# Patient Record
Sex: Female | Born: 1945
Health system: Southern US, Community
[De-identification: ages and names within clinical notes are randomized; demographics above are authoritative.]

## PROBLEM LIST (undated history)

## (undated) DIAGNOSIS — F329 Major depressive disorder, single episode, unspecified: Secondary | ICD-10-CM

## (undated) DIAGNOSIS — K5792 Diverticulitis of intestine, part unspecified, without perforation or abscess without bleeding: Secondary | ICD-10-CM

## (undated) DIAGNOSIS — I10 Essential (primary) hypertension: Secondary | ICD-10-CM

## (undated) DIAGNOSIS — K589 Irritable bowel syndrome without diarrhea: Secondary | ICD-10-CM

## (undated) DIAGNOSIS — E669 Obesity, unspecified: Secondary | ICD-10-CM

## (undated) DIAGNOSIS — R7303 Prediabetes: Secondary | ICD-10-CM

## (undated) DIAGNOSIS — E039 Hypothyroidism, unspecified: Secondary | ICD-10-CM

## (undated) DIAGNOSIS — M1990 Unspecified osteoarthritis, unspecified site: Secondary | ICD-10-CM

## (undated) DIAGNOSIS — T7840XA Allergy, unspecified, initial encounter: Secondary | ICD-10-CM

## (undated) DIAGNOSIS — E079 Disorder of thyroid, unspecified: Secondary | ICD-10-CM

## (undated) DIAGNOSIS — F32A Depression, unspecified: Secondary | ICD-10-CM

## (undated) DIAGNOSIS — E785 Hyperlipidemia, unspecified: Secondary | ICD-10-CM

## (undated) DIAGNOSIS — C50919 Malignant neoplasm of unspecified site of unspecified female breast: Secondary | ICD-10-CM

## (undated) HISTORY — DX: Unspecified osteoarthritis, unspecified site: M19.90

## (undated) HISTORY — DX: Allergy, unspecified, initial encounter: T78.40XA

## (undated) HISTORY — DX: Obesity, unspecified: E66.9

## (undated) HISTORY — DX: Hyperlipidemia, unspecified: E78.5

## (undated) HISTORY — DX: Depression, unspecified: F32.A

## (undated) HISTORY — DX: Disorder of thyroid, unspecified: E07.9

## (undated) HISTORY — PX: COLONOSCOPY: SHX174

## (undated) HISTORY — PX: MASTECTOMY, RADICAL: SHX710

## (undated) HISTORY — DX: Irritable bowel syndrome, unspecified: K58.9

## (undated) HISTORY — DX: Diverticulitis of intestine, part unspecified, without perforation or abscess without bleeding: K57.92

## (undated) HISTORY — PX: ROTATOR CUFF REPAIR: SHX139

## (undated) HISTORY — DX: Malignant neoplasm of unspecified site of unspecified female breast: C50.919

## (undated) HISTORY — DX: Major depressive disorder, single episode, unspecified: F32.9

---

## 2003-08-02 DIAGNOSIS — C50919 Malignant neoplasm of unspecified site of unspecified female breast: Secondary | ICD-10-CM

## 2003-08-02 HISTORY — DX: Malignant neoplasm of unspecified site of unspecified female breast: C50.919

## 2011-11-30 DIAGNOSIS — L570 Actinic keratosis: Secondary | ICD-10-CM | POA: Diagnosis not present

## 2011-11-30 DIAGNOSIS — Z85828 Personal history of other malignant neoplasm of skin: Secondary | ICD-10-CM | POA: Diagnosis not present

## 2011-11-30 DIAGNOSIS — L578 Other skin changes due to chronic exposure to nonionizing radiation: Secondary | ICD-10-CM | POA: Diagnosis not present

## 2011-11-30 DIAGNOSIS — D239 Other benign neoplasm of skin, unspecified: Secondary | ICD-10-CM | POA: Diagnosis not present

## 2011-11-30 DIAGNOSIS — L219 Seborrheic dermatitis, unspecified: Secondary | ICD-10-CM | POA: Diagnosis not present

## 2011-12-09 DIAGNOSIS — I10 Essential (primary) hypertension: Secondary | ICD-10-CM | POA: Diagnosis not present

## 2011-12-09 DIAGNOSIS — D510 Vitamin B12 deficiency anemia due to intrinsic factor deficiency: Secondary | ICD-10-CM | POA: Diagnosis not present

## 2011-12-09 DIAGNOSIS — Z79899 Other long term (current) drug therapy: Secondary | ICD-10-CM | POA: Diagnosis not present

## 2011-12-09 DIAGNOSIS — E785 Hyperlipidemia, unspecified: Secondary | ICD-10-CM | POA: Diagnosis not present

## 2011-12-09 DIAGNOSIS — M76899 Other specified enthesopathies of unspecified lower limb, excluding foot: Secondary | ICD-10-CM | POA: Diagnosis not present

## 2011-12-09 DIAGNOSIS — R5381 Other malaise: Secondary | ICD-10-CM | POA: Diagnosis not present

## 2011-12-09 DIAGNOSIS — E7800 Pure hypercholesterolemia, unspecified: Secondary | ICD-10-CM | POA: Diagnosis not present

## 2011-12-09 DIAGNOSIS — R5383 Other fatigue: Secondary | ICD-10-CM | POA: Diagnosis not present

## 2011-12-09 DIAGNOSIS — E559 Vitamin D deficiency, unspecified: Secondary | ICD-10-CM | POA: Diagnosis not present

## 2011-12-27 DIAGNOSIS — M545 Low back pain, unspecified: Secondary | ICD-10-CM | POA: Diagnosis not present

## 2011-12-27 DIAGNOSIS — M5430 Sciatica, unspecified side: Secondary | ICD-10-CM | POA: Diagnosis not present

## 2012-02-24 DIAGNOSIS — J019 Acute sinusitis, unspecified: Secondary | ICD-10-CM | POA: Diagnosis not present

## 2012-02-24 DIAGNOSIS — J309 Allergic rhinitis, unspecified: Secondary | ICD-10-CM | POA: Diagnosis not present

## 2012-05-16 DIAGNOSIS — L820 Inflamed seborrheic keratosis: Secondary | ICD-10-CM | POA: Diagnosis not present

## 2012-05-16 DIAGNOSIS — L570 Actinic keratosis: Secondary | ICD-10-CM | POA: Diagnosis not present

## 2012-05-16 DIAGNOSIS — D239 Other benign neoplasm of skin, unspecified: Secondary | ICD-10-CM | POA: Diagnosis not present

## 2012-05-16 DIAGNOSIS — Z85828 Personal history of other malignant neoplasm of skin: Secondary | ICD-10-CM | POA: Diagnosis not present

## 2012-07-11 DIAGNOSIS — C50919 Malignant neoplasm of unspecified site of unspecified female breast: Secondary | ICD-10-CM | POA: Diagnosis not present

## 2012-07-12 DIAGNOSIS — Z853 Personal history of malignant neoplasm of breast: Secondary | ICD-10-CM | POA: Diagnosis not present

## 2012-10-11 DIAGNOSIS — G5600 Carpal tunnel syndrome, unspecified upper limb: Secondary | ICD-10-CM | POA: Diagnosis not present

## 2012-10-11 DIAGNOSIS — M25519 Pain in unspecified shoulder: Secondary | ICD-10-CM | POA: Diagnosis not present

## 2012-10-12 DIAGNOSIS — E7800 Pure hypercholesterolemia, unspecified: Secondary | ICD-10-CM | POA: Diagnosis not present

## 2012-10-12 DIAGNOSIS — D510 Vitamin B12 deficiency anemia due to intrinsic factor deficiency: Secondary | ICD-10-CM | POA: Diagnosis not present

## 2012-10-12 DIAGNOSIS — I10 Essential (primary) hypertension: Secondary | ICD-10-CM | POA: Diagnosis not present

## 2012-10-12 DIAGNOSIS — Z79899 Other long term (current) drug therapy: Secondary | ICD-10-CM | POA: Diagnosis not present

## 2012-10-12 DIAGNOSIS — E559 Vitamin D deficiency, unspecified: Secondary | ICD-10-CM | POA: Diagnosis not present

## 2012-10-31 DIAGNOSIS — L570 Actinic keratosis: Secondary | ICD-10-CM | POA: Diagnosis not present

## 2012-10-31 DIAGNOSIS — L819 Disorder of pigmentation, unspecified: Secondary | ICD-10-CM | POA: Diagnosis not present

## 2012-10-31 DIAGNOSIS — Z85828 Personal history of other malignant neoplasm of skin: Secondary | ICD-10-CM | POA: Diagnosis not present

## 2012-10-31 DIAGNOSIS — D239 Other benign neoplasm of skin, unspecified: Secondary | ICD-10-CM | POA: Diagnosis not present

## 2012-11-23 DIAGNOSIS — I10 Essential (primary) hypertension: Secondary | ICD-10-CM | POA: Diagnosis not present

## 2012-11-23 DIAGNOSIS — E039 Hypothyroidism, unspecified: Secondary | ICD-10-CM | POA: Diagnosis not present

## 2012-11-27 DIAGNOSIS — R7989 Other specified abnormal findings of blood chemistry: Secondary | ICD-10-CM | POA: Diagnosis not present

## 2013-02-14 DIAGNOSIS — R7989 Other specified abnormal findings of blood chemistry: Secondary | ICD-10-CM | POA: Diagnosis not present

## 2013-02-14 DIAGNOSIS — E781 Pure hyperglyceridemia: Secondary | ICD-10-CM | POA: Diagnosis not present

## 2013-02-14 DIAGNOSIS — E559 Vitamin D deficiency, unspecified: Secondary | ICD-10-CM | POA: Diagnosis not present

## 2013-02-14 DIAGNOSIS — I10 Essential (primary) hypertension: Secondary | ICD-10-CM | POA: Diagnosis not present

## 2013-02-14 DIAGNOSIS — E039 Hypothyroidism, unspecified: Secondary | ICD-10-CM | POA: Diagnosis not present

## 2013-02-14 DIAGNOSIS — E7800 Pure hypercholesterolemia, unspecified: Secondary | ICD-10-CM | POA: Diagnosis not present

## 2013-04-24 DIAGNOSIS — Z85828 Personal history of other malignant neoplasm of skin: Secondary | ICD-10-CM | POA: Diagnosis not present

## 2013-04-24 DIAGNOSIS — L820 Inflamed seborrheic keratosis: Secondary | ICD-10-CM | POA: Diagnosis not present

## 2013-04-24 DIAGNOSIS — D239 Other benign neoplasm of skin, unspecified: Secondary | ICD-10-CM | POA: Diagnosis not present

## 2013-05-16 DIAGNOSIS — K5732 Diverticulitis of large intestine without perforation or abscess without bleeding: Secondary | ICD-10-CM | POA: Diagnosis not present

## 2013-07-16 DIAGNOSIS — G5600 Carpal tunnel syndrome, unspecified upper limb: Secondary | ICD-10-CM | POA: Diagnosis not present

## 2013-07-16 DIAGNOSIS — M25549 Pain in joints of unspecified hand: Secondary | ICD-10-CM | POA: Diagnosis not present

## 2013-09-09 DIAGNOSIS — C50919 Malignant neoplasm of unspecified site of unspecified female breast: Secondary | ICD-10-CM | POA: Diagnosis not present

## 2013-09-25 DIAGNOSIS — Z853 Personal history of malignant neoplasm of breast: Secondary | ICD-10-CM | POA: Diagnosis not present

## 2013-09-25 DIAGNOSIS — Z09 Encounter for follow-up examination after completed treatment for conditions other than malignant neoplasm: Secondary | ICD-10-CM | POA: Diagnosis not present

## 2013-09-25 DIAGNOSIS — Z9010 Acquired absence of unspecified breast and nipple: Secondary | ICD-10-CM | POA: Diagnosis not present

## 2013-10-24 DIAGNOSIS — R6889 Other general symptoms and signs: Secondary | ICD-10-CM | POA: Diagnosis not present

## 2013-10-24 DIAGNOSIS — J111 Influenza due to unidentified influenza virus with other respiratory manifestations: Secondary | ICD-10-CM | POA: Diagnosis not present

## 2013-10-24 DIAGNOSIS — J069 Acute upper respiratory infection, unspecified: Secondary | ICD-10-CM | POA: Diagnosis not present

## 2013-10-25 DIAGNOSIS — J111 Influenza due to unidentified influenza virus with other respiratory manifestations: Secondary | ICD-10-CM | POA: Diagnosis not present

## 2013-11-06 DIAGNOSIS — J159 Unspecified bacterial pneumonia: Secondary | ICD-10-CM | POA: Diagnosis not present

## 2013-11-13 DIAGNOSIS — E039 Hypothyroidism, unspecified: Secondary | ICD-10-CM | POA: Diagnosis not present

## 2013-11-13 DIAGNOSIS — B370 Candidal stomatitis: Secondary | ICD-10-CM | POA: Diagnosis not present

## 2013-11-13 DIAGNOSIS — D510 Vitamin B12 deficiency anemia due to intrinsic factor deficiency: Secondary | ICD-10-CM | POA: Diagnosis not present

## 2013-11-13 DIAGNOSIS — I10 Essential (primary) hypertension: Secondary | ICD-10-CM | POA: Diagnosis not present

## 2013-11-13 DIAGNOSIS — E7800 Pure hypercholesterolemia, unspecified: Secondary | ICD-10-CM | POA: Diagnosis not present

## 2013-11-13 DIAGNOSIS — Z6831 Body mass index (BMI) 31.0-31.9, adult: Secondary | ICD-10-CM | POA: Diagnosis not present

## 2013-11-13 DIAGNOSIS — M810 Age-related osteoporosis without current pathological fracture: Secondary | ICD-10-CM | POA: Diagnosis not present

## 2013-11-13 DIAGNOSIS — J209 Acute bronchitis, unspecified: Secondary | ICD-10-CM | POA: Diagnosis not present

## 2013-11-13 DIAGNOSIS — M62838 Other muscle spasm: Secondary | ICD-10-CM | POA: Diagnosis not present

## 2013-11-13 DIAGNOSIS — M5380 Other specified dorsopathies, site unspecified: Secondary | ICD-10-CM | POA: Diagnosis not present

## 2013-12-11 DIAGNOSIS — D239 Other benign neoplasm of skin, unspecified: Secondary | ICD-10-CM | POA: Diagnosis not present

## 2013-12-11 DIAGNOSIS — Z85828 Personal history of other malignant neoplasm of skin: Secondary | ICD-10-CM | POA: Diagnosis not present

## 2013-12-11 DIAGNOSIS — L570 Actinic keratosis: Secondary | ICD-10-CM | POA: Diagnosis not present

## 2014-04-21 ENCOUNTER — Other Ambulatory Visit (HOSPITAL_COMMUNITY): Payer: Self-pay | Admitting: Family Medicine

## 2014-04-21 DIAGNOSIS — E039 Hypothyroidism, unspecified: Secondary | ICD-10-CM | POA: Diagnosis not present

## 2014-04-21 DIAGNOSIS — I10 Essential (primary) hypertension: Secondary | ICD-10-CM | POA: Diagnosis not present

## 2014-04-21 DIAGNOSIS — M7989 Other specified soft tissue disorders: Secondary | ICD-10-CM

## 2014-04-21 DIAGNOSIS — E785 Hyperlipidemia, unspecified: Secondary | ICD-10-CM | POA: Diagnosis not present

## 2014-04-21 DIAGNOSIS — R609 Edema, unspecified: Secondary | ICD-10-CM | POA: Diagnosis not present

## 2014-04-22 ENCOUNTER — Ambulatory Visit (HOSPITAL_COMMUNITY)
Admission: RE | Admit: 2014-04-22 | Discharge: 2014-04-22 | Disposition: A | Discharge status: Home / Self Care | Payer: Medicare Other | Source: Ambulatory Visit | Attending: Family Medicine | Admitting: Family Medicine

## 2014-04-22 DIAGNOSIS — M7989 Other specified soft tissue disorders: Secondary | ICD-10-CM | POA: Diagnosis not present

## 2014-04-22 DIAGNOSIS — R599 Enlarged lymph nodes, unspecified: Secondary | ICD-10-CM | POA: Insufficient documentation

## 2014-04-22 NOTE — Progress Notes (Signed)
Right lower extremity venous duplex completed.  Right:  No evidence of DVT, superficial thrombosis, or Baker's cyst.  Left:  Negative for DVT in the common femoral vein.  

## 2014-04-30 DIAGNOSIS — E039 Hypothyroidism, unspecified: Secondary | ICD-10-CM | POA: Diagnosis not present

## 2014-04-30 DIAGNOSIS — Z853 Personal history of malignant neoplasm of breast: Secondary | ICD-10-CM | POA: Diagnosis not present

## 2014-04-30 DIAGNOSIS — F3289 Other specified depressive episodes: Secondary | ICD-10-CM | POA: Diagnosis not present

## 2014-04-30 DIAGNOSIS — M25579 Pain in unspecified ankle and joints of unspecified foot: Secondary | ICD-10-CM | POA: Diagnosis not present

## 2014-04-30 DIAGNOSIS — E669 Obesity, unspecified: Secondary | ICD-10-CM | POA: Diagnosis not present

## 2014-04-30 DIAGNOSIS — R7309 Other abnormal glucose: Secondary | ICD-10-CM | POA: Diagnosis not present

## 2014-04-30 DIAGNOSIS — Z0000 Encounter for general adult medical examination without abnormal findings: Secondary | ICD-10-CM | POA: Diagnosis not present

## 2014-04-30 DIAGNOSIS — F329 Major depressive disorder, single episode, unspecified: Secondary | ICD-10-CM | POA: Diagnosis not present

## 2014-04-30 DIAGNOSIS — K5732 Diverticulitis of large intestine without perforation or abscess without bleeding: Secondary | ICD-10-CM | POA: Diagnosis not present

## 2014-05-01 ENCOUNTER — Ambulatory Visit: Payer: Self-pay | Admitting: Family

## 2014-05-28 DIAGNOSIS — R609 Edema, unspecified: Secondary | ICD-10-CM | POA: Diagnosis not present

## 2014-05-28 DIAGNOSIS — R739 Hyperglycemia, unspecified: Secondary | ICD-10-CM | POA: Diagnosis not present

## 2014-06-11 DIAGNOSIS — L814 Other melanin hyperpigmentation: Secondary | ICD-10-CM | POA: Diagnosis not present

## 2014-06-11 DIAGNOSIS — D225 Melanocytic nevi of trunk: Secondary | ICD-10-CM | POA: Diagnosis not present

## 2014-06-11 DIAGNOSIS — Z86008 Personal history of in-situ neoplasm of other site: Secondary | ICD-10-CM | POA: Diagnosis not present

## 2014-06-11 DIAGNOSIS — Z09 Encounter for follow-up examination after completed treatment for conditions other than malignant neoplasm: Secondary | ICD-10-CM | POA: Diagnosis not present

## 2014-06-11 DIAGNOSIS — L820 Inflamed seborrheic keratosis: Secondary | ICD-10-CM | POA: Diagnosis not present

## 2014-08-19 DIAGNOSIS — K5792 Diverticulitis of intestine, part unspecified, without perforation or abscess without bleeding: Secondary | ICD-10-CM | POA: Diagnosis not present

## 2014-08-19 DIAGNOSIS — C50919 Malignant neoplasm of unspecified site of unspecified female breast: Secondary | ICD-10-CM | POA: Diagnosis not present

## 2014-08-19 DIAGNOSIS — F339 Major depressive disorder, recurrent, unspecified: Secondary | ICD-10-CM | POA: Diagnosis not present

## 2014-08-19 DIAGNOSIS — Z23 Encounter for immunization: Secondary | ICD-10-CM | POA: Diagnosis not present

## 2014-09-19 DIAGNOSIS — K589 Irritable bowel syndrome without diarrhea: Secondary | ICD-10-CM | POA: Diagnosis not present

## 2014-09-19 DIAGNOSIS — I10 Essential (primary) hypertension: Secondary | ICD-10-CM | POA: Diagnosis not present

## 2014-09-19 DIAGNOSIS — E039 Hypothyroidism, unspecified: Secondary | ICD-10-CM | POA: Diagnosis not present

## 2014-09-19 DIAGNOSIS — E785 Hyperlipidemia, unspecified: Secondary | ICD-10-CM | POA: Diagnosis not present

## 2014-11-07 ENCOUNTER — Emergency Department (HOSPITAL_COMMUNITY): Payer: Medicare Other

## 2014-11-07 ENCOUNTER — Emergency Department (HOSPITAL_COMMUNITY)
Admission: EM | Admit: 2014-11-07 | Discharge: 2014-11-07 | Disposition: A | Discharge status: Home / Self Care | Payer: Medicare Other | Attending: Emergency Medicine | Admitting: Emergency Medicine

## 2014-11-07 ENCOUNTER — Encounter (HOSPITAL_COMMUNITY): Payer: Self-pay | Admitting: Emergency Medicine

## 2014-11-07 DIAGNOSIS — S3992XA Unspecified injury of lower back, initial encounter: Secondary | ICD-10-CM | POA: Diagnosis not present

## 2014-11-07 DIAGNOSIS — W1789XA Other fall from one level to another, initial encounter: Secondary | ICD-10-CM | POA: Diagnosis not present

## 2014-11-07 DIAGNOSIS — S52611A Displaced fracture of right ulna styloid process, initial encounter for closed fracture: Secondary | ICD-10-CM | POA: Diagnosis not present

## 2014-11-07 DIAGNOSIS — Z7982 Long term (current) use of aspirin: Secondary | ICD-10-CM | POA: Insufficient documentation

## 2014-11-07 DIAGNOSIS — I10 Essential (primary) hypertension: Secondary | ICD-10-CM | POA: Insufficient documentation

## 2014-11-07 DIAGNOSIS — Y9389 Activity, other specified: Secondary | ICD-10-CM | POA: Insufficient documentation

## 2014-11-07 DIAGNOSIS — Z79899 Other long term (current) drug therapy: Secondary | ICD-10-CM | POA: Diagnosis not present

## 2014-11-07 DIAGNOSIS — W19XXXA Unspecified fall, initial encounter: Secondary | ICD-10-CM

## 2014-11-07 DIAGNOSIS — Y998 Other external cause status: Secondary | ICD-10-CM | POA: Diagnosis not present

## 2014-11-07 DIAGNOSIS — Y9289 Other specified places as the place of occurrence of the external cause: Secondary | ICD-10-CM | POA: Insufficient documentation

## 2014-11-07 DIAGNOSIS — S6991XA Unspecified injury of right wrist, hand and finger(s), initial encounter: Secondary | ICD-10-CM | POA: Diagnosis present

## 2014-11-07 DIAGNOSIS — M25431 Effusion, right wrist: Secondary | ICD-10-CM | POA: Insufficient documentation

## 2014-11-07 DIAGNOSIS — M545 Low back pain: Secondary | ICD-10-CM | POA: Diagnosis not present

## 2014-11-07 DIAGNOSIS — S62101A Fracture of unspecified carpal bone, right wrist, initial encounter for closed fracture: Secondary | ICD-10-CM

## 2014-11-07 HISTORY — DX: Essential (primary) hypertension: I10

## 2014-11-07 MED ORDER — METHOCARBAMOL 500 MG PO TABS
1000.0000 mg | ORAL_TABLET | Freq: Once | ORAL | Status: AC
  Administered 2014-11-07: 1000 mg via ORAL
  Filled 2014-11-07: qty 2

## 2014-11-07 MED ORDER — TRAMADOL HCL 50 MG PO TABS: 50.0000 mg | ORAL_TABLET | Freq: Four times a day (QID) | ORAL | Status: DC | PRN

## 2014-11-07 MED ORDER — TRAMADOL HCL 50 MG PO TABS
50.0000 mg | ORAL_TABLET | Freq: Once | ORAL | Status: AC
  Administered 2014-11-07: 50 mg via ORAL
  Filled 2014-11-07: qty 1

## 2014-11-07 MED ORDER — METHOCARBAMOL 500 MG PO TABS: 500.0000 mg | ORAL_TABLET | Freq: Three times a day (TID) | ORAL | Status: DC | PRN

## 2014-11-07 NOTE — Progress Notes (Signed)
Orthopedic Tech Progress Note Patient Details:  Jamie Benjamin 1946/01/10 [id number]  Ortho Devices Type of Ortho Device: Ace wrap, Arm sling, Sugartong splint Ortho Device/Splint Location: rue Ortho Device/Splint Interventions: Application   [name] 11/07/2014, 2:25 PM

## 2014-11-07 NOTE — ED Provider Notes (Addendum)
CSN: [id number]     Arrival date & time 11/07/14  1151 History   First MD Initiated Contact with Patient 11/07/14 1155     Chief Complaint  Patient presents with  . Fall  . Wrist Injury  . Back Pain     HPI  Procedure dilation after a fall. She was standing on her couch adjusting her curtains. She fell backwards. His pain right wrist and her low back. 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  Check her head or lose consciousness per no others of pain or concern.  Past Medical History  Diagnosis Date  . Hypertension    Past Surgical History  Procedure Laterality Date  . Mastectomy, radical Bilateral 10 years ago  . Rotator cuff repair     No family history on file. History  Substance Use Topics  . Smoking status: Never Smoker   . Smokeless tobacco: Not on file  . Alcohol Use: No   OB History    No data available     Review of Systems  Constitutional: Negative for fever, chills, diaphoresis, appetite change and fatigue.  HENT: Negative for mouth sores, sore throat and trouble swallowing.   Eyes: Negative for visual disturbance.  Respiratory: Negative for cough, chest tightness, shortness of breath and wheezing.   Cardiovascular: Negative for chest pain.  Gastrointestinal: Negative for nausea, vomiting, abdominal pain, diarrhea and abdominal distention.  Endocrine: Negative for polydipsia, polyphagia and polyuria.  Genitourinary: Negative for dysuria, frequency and hematuria.  Musculoskeletal: Negative for gait problem.       Right wrist pain, low back pain.  Skin: Negative for color change, pallor and rash.  Neurological: Negative for dizziness, syncope, light-headedness and headaches.  Hematological: Does not bruise/bleed easily.  Psychiatric/Behavioral: Negative for behavioral problems and confusion.      Allergies  Review of patient's allergies indicates no known allergies.  Home  Medications   Prior to Admission medications   Medication Sig Start Date End Date Taking? Authorizing Provider  amLODipine (NORVASC) 10 MG tablet Take 10 mg by mouth daily. 08/31/14  Yes Historical Provider, MD  aspirin 325 MG tablet Take 325 mg by mouth daily.   Yes Historical Provider, MD  buPROPion (WELLBUTRIN XL) 150 MG 24 hr tablet Take 150 mg by mouth daily. 10/10/14  Yes Historical Provider, MD  cholecalciferol (VITAMIN D) 1000 UNITS tablet Take 2,000 Units by mouth daily.   Yes Historical Provider, MD  furosemide (LASIX) 20 MG tablet Take 20 mg by mouth daily. 08/12/14  Yes Historical Provider, MD  levothyroxine (SYNTHROID, LEVOTHROID) 25 MCG tablet Take 25 mcg by mouth daily before breakfast.   Yes Historical Provider, MD  metoprolol (LOPRESSOR) 50 MG tablet Take 50 mg by mouth 2 (two) times daily. 08/30/14  Yes Historical Provider, MD  pravastatin (PRAVACHOL) 40 MG tablet Take 40 mg by mouth at bedtime. 08/25/14  Yes Historical Provider, MD  methocarbamol (ROBAXIN) 500 MG tablet Take 1 tablet (500 mg total) by mouth 3 (three) times daily between meals as needed. 11/07/14   Tanna Furry, MD  traMADol (ULTRAM) 50 MG tablet Take 1 tablet (50 mg total) by mouth every 6 (six) hours as needed. 11/07/14   Tanna Furry, MD   BP 145/75 mmHg  Pulse 76  Temp(Src) 97.5 F (36.4 C) (Oral)  Resp 20  SpO2 98% Physical Exam  Constitutional: She is oriented to person, place, and time. She appears well-developed  and well-nourished. No distress.  HENT:  Head: Normocephalic.  Eyes: Conjunctivae are normal. Pupils are equal, round, and reactive to light. No scleral icterus.  Neck: Normal range of motion. Neck supple. No thyromegaly present.  Cardiovascular: Normal rate and regular rhythm.  Exam reveals no gallop and no friction rub.   No murmur heard. Pulmonary/Chest: Effort normal and breath sounds normal. No respiratory distress. She has no wheezes. She has no rales.  Abdominal: Soft. Bowel sounds are  normal. She exhibits no distension. There is no tenderness. There is no rebound.  Musculoskeletal: Normal range of motion.  Tenderness and soft tissue swelling over the dorsum of the right wrist. Full range of motion and good sensation distally. Tenderness in the midline and para midline lumbar spine.  Neurological: She is alert and oriented to person, place, and time.  Skin: Skin is warm and dry. No rash noted.  Psychiatric: She has a normal mood and affect. Her behavior is normal.   intact sensation and muscle strength to the lower extremity. Ambulatory.  ED Course  Procedures (including critical care time) Labs Review Labs Reviewed - No data to display  Imaging Review No results found.   EKG Interpretation None      X ray with distal ulna fracture, non displaced, non-intra-articular radial fracture, and ?Triquetral fracture. Upper lumbar lower thoracic show what would appear to be old compression fractures.  MDM   Final diagnoses:  Fall  Wrist fracture, right, closed, initial encounter    Place a splint. Patient will follow-up with hand surgery. After discussion regarding pain control we decided on Ultram and Robaxin.  Tanna Furry, MD 11/07/14 [place], MD 11/07/14 [place], MD 11/07/14 [phone]

## 2014-11-07 NOTE — Discharge Instructions (Signed)
Wrist Fracture °A wrist fracture is a break or crack in one of the bones of your wrist. Your wrist is made up of eight small bones at the palm of your hand (carpal bones) and two long bones that make up your forearm (radius and ulna). The goal of treatment is to hold the injured bone in place while it heals. Surgery may or may not be needed to care for your injured wrist.  °HOME CARE °· Keep your injured wrist raised (elevated). Move your fingers as much as you can. °· Do not put pressure on any part of your cast or splint. It may break. °· Use a plastic bag to protect your cast or splint from water while bathing or showering. Do not lower your cast or splint into water. °· Take medicines only as told by your doctor. °· Keep your cast or splint clean and dry. If it gets wet, damaged, or suddenly feels too tight, tell your doctor right away. °· Do not use any tobacco products including cigarettes, chewing tobacco, or electronic cigarettes. Tobacco can slow bone healing. If you need help quitting, ask your doctor. °· Keep all follow-up visits as told by your doctor. This is important. °· Ask your doctor if you should take supplements of calcium and vitamins C and D. °GET HELP IF:  °· Your cast or splint is damaged, breaks, or gets wet. °· You have a fever. °· You have chills. °· You have very bad pain that does not go away. °· You have more swelling (inflammation) than before the cast was put on. °GET HELP RIGHT AWAY IF:  °· Your hand or fingernails on the injured arm turn blue or gray, or feel cold or numb. °· You lose some feeling in the fingers of your injured arm. °MAKE SURE YOU:  °· Understand these instructions. °· Will watch your condition. °· Will get help right away if you are not doing well or get worse. °Document Released: 01/04/2008 Document Revised: 12/02/2013 Document Reviewed: 01/30/2012 °ExitCare® Patient Information ©2015 [employer]. This information is not intended to replace advice given to you  by your health care provider. Make sure you discuss any questions you have with your health care provider. ° °

## 2014-11-07 NOTE — ED Notes (Signed)
Called ortho tech to place short arm splint

## 2014-11-07 NOTE — ED Notes (Signed)
Pt in X ray

## 2014-11-07 NOTE — ED Notes (Signed)
To ED via private vehicle-- was standing on arm of couch with a cushion on top of arm -- was reaching to fix blinds and fell straight backwards. Hit back of head, right wrist swollen, low back pain. Denies loss of consciousness, alert and oriented x 4, moves all extremities.

## 2014-11-13 DIAGNOSIS — S5291XA Unspecified fracture of right forearm, initial encounter for closed fracture: Secondary | ICD-10-CM | POA: Diagnosis not present

## 2014-11-13 DIAGNOSIS — M4310 Spondylolisthesis, site unspecified: Secondary | ICD-10-CM | POA: Diagnosis not present

## 2014-11-13 DIAGNOSIS — S62114A Nondisplaced fracture of triquetrum [cuneiform] bone, right wrist, initial encounter for closed fracture: Secondary | ICD-10-CM | POA: Diagnosis not present

## 2014-11-27 DIAGNOSIS — S52201D Unspecified fracture of shaft of right ulna, subsequent encounter for closed fracture with routine healing: Secondary | ICD-10-CM | POA: Diagnosis not present

## 2014-11-27 DIAGNOSIS — S62114D Nondisplaced fracture of triquetrum [cuneiform] bone, right wrist, subsequent encounter for fracture with routine healing: Secondary | ICD-10-CM | POA: Diagnosis not present

## 2014-11-27 DIAGNOSIS — S5291XD Unspecified fracture of right forearm, subsequent encounter for closed fracture with routine healing: Secondary | ICD-10-CM | POA: Diagnosis not present

## 2014-12-08 DIAGNOSIS — S52201D Unspecified fracture of shaft of right ulna, subsequent encounter for closed fracture with routine healing: Secondary | ICD-10-CM | POA: Diagnosis not present

## 2014-12-08 DIAGNOSIS — S62114D Nondisplaced fracture of triquetrum [cuneiform] bone, right wrist, subsequent encounter for fracture with routine healing: Secondary | ICD-10-CM | POA: Diagnosis not present

## 2014-12-08 DIAGNOSIS — S5291XD Unspecified fracture of right forearm, subsequent encounter for closed fracture with routine healing: Secondary | ICD-10-CM | POA: Diagnosis not present

## 2014-12-22 DIAGNOSIS — S52201D Unspecified fracture of shaft of right ulna, subsequent encounter for closed fracture with routine healing: Secondary | ICD-10-CM | POA: Diagnosis not present

## 2014-12-22 DIAGNOSIS — S62114D Nondisplaced fracture of triquetrum [cuneiform] bone, right wrist, subsequent encounter for fracture with routine healing: Secondary | ICD-10-CM | POA: Diagnosis not present

## 2015-01-05 DIAGNOSIS — Z86008 Personal history of in-situ neoplasm of other site: Secondary | ICD-10-CM | POA: Diagnosis not present

## 2015-01-05 DIAGNOSIS — Z1283 Encounter for screening for malignant neoplasm of skin: Secondary | ICD-10-CM | POA: Diagnosis not present

## 2015-01-05 DIAGNOSIS — L570 Actinic keratosis: Secondary | ICD-10-CM | POA: Diagnosis not present

## 2015-01-05 DIAGNOSIS — D225 Melanocytic nevi of trunk: Secondary | ICD-10-CM | POA: Diagnosis not present

## 2015-01-05 DIAGNOSIS — Z09 Encounter for follow-up examination after completed treatment for conditions other than malignant neoplasm: Secondary | ICD-10-CM | POA: Diagnosis not present

## 2015-01-08 DIAGNOSIS — S62114D Nondisplaced fracture of triquetrum [cuneiform] bone, right wrist, subsequent encounter for fracture with routine healing: Secondary | ICD-10-CM | POA: Diagnosis not present

## 2015-01-08 DIAGNOSIS — S52201D Unspecified fracture of shaft of right ulna, subsequent encounter for closed fracture with routine healing: Secondary | ICD-10-CM | POA: Diagnosis not present

## 2015-01-08 DIAGNOSIS — S52501A Unspecified fracture of the lower end of right radius, initial encounter for closed fracture: Secondary | ICD-10-CM | POA: Diagnosis not present

## 2015-01-15 DIAGNOSIS — S52501D Unspecified fracture of the lower end of right radius, subsequent encounter for closed fracture with routine healing: Secondary | ICD-10-CM | POA: Diagnosis not present

## 2015-02-05 DIAGNOSIS — S62114D Nondisplaced fracture of triquetrum [cuneiform] bone, right wrist, subsequent encounter for fracture with routine healing: Secondary | ICD-10-CM | POA: Diagnosis not present

## 2015-02-05 DIAGNOSIS — S52201D Unspecified fracture of shaft of right ulna, subsequent encounter for closed fracture with routine healing: Secondary | ICD-10-CM | POA: Diagnosis not present

## 2015-02-05 DIAGNOSIS — S5291XD Unspecified fracture of right forearm, subsequent encounter for closed fracture with routine healing: Secondary | ICD-10-CM | POA: Diagnosis not present

## 2015-03-20 DIAGNOSIS — E785 Hyperlipidemia, unspecified: Secondary | ICD-10-CM | POA: Diagnosis not present

## 2015-03-20 DIAGNOSIS — M810 Age-related osteoporosis without current pathological fracture: Secondary | ICD-10-CM | POA: Diagnosis not present

## 2015-03-20 DIAGNOSIS — E039 Hypothyroidism, unspecified: Secondary | ICD-10-CM | POA: Diagnosis not present

## 2015-03-20 DIAGNOSIS — F3341 Major depressive disorder, recurrent, in partial remission: Secondary | ICD-10-CM | POA: Diagnosis not present

## 2015-03-20 DIAGNOSIS — I10 Essential (primary) hypertension: Secondary | ICD-10-CM | POA: Diagnosis not present

## 2015-04-17 DIAGNOSIS — I10 Essential (primary) hypertension: Secondary | ICD-10-CM | POA: Diagnosis not present

## 2015-07-06 DIAGNOSIS — D225 Melanocytic nevi of trunk: Secondary | ICD-10-CM | POA: Diagnosis not present

## 2015-07-06 DIAGNOSIS — Z09 Encounter for follow-up examination after completed treatment for conditions other than malignant neoplasm: Secondary | ICD-10-CM | POA: Diagnosis not present

## 2015-07-06 DIAGNOSIS — Z86008 Personal history of in-situ neoplasm of other site: Secondary | ICD-10-CM | POA: Diagnosis not present

## 2015-07-06 DIAGNOSIS — L218 Other seborrheic dermatitis: Secondary | ICD-10-CM | POA: Diagnosis not present

## 2015-07-06 DIAGNOSIS — L820 Inflamed seborrheic keratosis: Secondary | ICD-10-CM | POA: Diagnosis not present

## 2015-07-13 DIAGNOSIS — Z0000 Encounter for general adult medical examination without abnormal findings: Secondary | ICD-10-CM | POA: Diagnosis not present

## 2015-07-13 DIAGNOSIS — E785 Hyperlipidemia, unspecified: Secondary | ICD-10-CM | POA: Diagnosis not present

## 2015-07-13 DIAGNOSIS — D510 Vitamin B12 deficiency anemia due to intrinsic factor deficiency: Secondary | ICD-10-CM | POA: Diagnosis not present

## 2015-07-13 DIAGNOSIS — E039 Hypothyroidism, unspecified: Secondary | ICD-10-CM | POA: Diagnosis not present

## 2015-07-13 DIAGNOSIS — K589 Irritable bowel syndrome without diarrhea: Secondary | ICD-10-CM | POA: Diagnosis not present

## 2015-07-13 DIAGNOSIS — I10 Essential (primary) hypertension: Secondary | ICD-10-CM | POA: Diagnosis not present

## 2015-07-13 DIAGNOSIS — R739 Hyperglycemia, unspecified: Secondary | ICD-10-CM | POA: Diagnosis not present

## 2015-08-25 DIAGNOSIS — Z23 Encounter for immunization: Secondary | ICD-10-CM | POA: Diagnosis not present

## 2016-02-08 DIAGNOSIS — D3709 Neoplasm of uncertain behavior of other specified sites of the oral cavity: Secondary | ICD-10-CM | POA: Diagnosis not present

## 2016-06-22 DIAGNOSIS — M62830 Muscle spasm of back: Secondary | ICD-10-CM | POA: Diagnosis not present

## 2016-06-22 DIAGNOSIS — M5432 Sciatica, left side: Secondary | ICD-10-CM | POA: Diagnosis not present

## 2016-06-22 DIAGNOSIS — M5431 Sciatica, right side: Secondary | ICD-10-CM | POA: Diagnosis not present

## 2016-06-22 DIAGNOSIS — Z8781 Personal history of (healed) traumatic fracture: Secondary | ICD-10-CM | POA: Diagnosis not present

## 2016-06-27 ENCOUNTER — Other Ambulatory Visit: Payer: Self-pay | Admitting: Family Medicine

## 2016-06-27 DIAGNOSIS — M5432 Sciatica, left side: Secondary | ICD-10-CM

## 2016-06-27 DIAGNOSIS — M5441 Lumbago with sciatica, right side: Secondary | ICD-10-CM

## 2016-06-27 DIAGNOSIS — M5431 Sciatica, right side: Secondary | ICD-10-CM

## 2016-06-27 DIAGNOSIS — M5442 Lumbago with sciatica, left side: Secondary | ICD-10-CM

## 2016-06-27 DIAGNOSIS — G8929 Other chronic pain: Secondary | ICD-10-CM

## 2016-07-01 ENCOUNTER — Other Ambulatory Visit: Payer: Medicare Other

## 2016-07-01 ENCOUNTER — Ambulatory Visit
Admission: RE | Admit: 2016-07-01 | Discharge: 2016-07-01 | Disposition: A | Discharge status: Home / Self Care | Payer: Medicare Other | Source: Ambulatory Visit | Attending: Family Medicine | Admitting: Family Medicine

## 2016-07-01 DIAGNOSIS — M5431 Sciatica, right side: Secondary | ICD-10-CM

## 2016-07-01 DIAGNOSIS — G8929 Other chronic pain: Secondary | ICD-10-CM

## 2016-07-01 DIAGNOSIS — M5441 Lumbago with sciatica, right side: Secondary | ICD-10-CM

## 2016-07-01 DIAGNOSIS — M5126 Other intervertebral disc displacement, lumbar region: Secondary | ICD-10-CM | POA: Diagnosis not present

## 2016-07-01 DIAGNOSIS — M5442 Lumbago with sciatica, left side: Secondary | ICD-10-CM

## 2016-07-01 DIAGNOSIS — M5432 Sciatica, left side: Secondary | ICD-10-CM

## 2016-07-26 DIAGNOSIS — R7309 Other abnormal glucose: Secondary | ICD-10-CM | POA: Diagnosis not present

## 2016-07-26 DIAGNOSIS — D510 Vitamin B12 deficiency anemia due to intrinsic factor deficiency: Secondary | ICD-10-CM | POA: Diagnosis not present

## 2016-07-26 DIAGNOSIS — Z0000 Encounter for general adult medical examination without abnormal findings: Secondary | ICD-10-CM | POA: Diagnosis not present

## 2016-07-26 DIAGNOSIS — R7303 Prediabetes: Secondary | ICD-10-CM | POA: Diagnosis not present

## 2016-07-26 DIAGNOSIS — E785 Hyperlipidemia, unspecified: Secondary | ICD-10-CM | POA: Diagnosis not present

## 2016-07-26 DIAGNOSIS — I10 Essential (primary) hypertension: Secondary | ICD-10-CM | POA: Diagnosis not present

## 2016-07-26 DIAGNOSIS — E039 Hypothyroidism, unspecified: Secondary | ICD-10-CM | POA: Diagnosis not present

## 2016-09-28 DIAGNOSIS — I10 Essential (primary) hypertension: Secondary | ICD-10-CM | POA: Diagnosis not present

## 2016-10-17 DIAGNOSIS — I10 Essential (primary) hypertension: Secondary | ICD-10-CM | POA: Diagnosis not present

## 2016-10-19 DIAGNOSIS — Z85828 Personal history of other malignant neoplasm of skin: Secondary | ICD-10-CM | POA: Diagnosis not present

## 2016-10-19 DIAGNOSIS — L570 Actinic keratosis: Secondary | ICD-10-CM | POA: Diagnosis not present

## 2016-10-19 DIAGNOSIS — D225 Melanocytic nevi of trunk: Secondary | ICD-10-CM | POA: Diagnosis not present

## 2016-10-19 DIAGNOSIS — L218 Other seborrheic dermatitis: Secondary | ICD-10-CM | POA: Diagnosis not present

## 2016-10-19 DIAGNOSIS — L814 Other melanin hyperpigmentation: Secondary | ICD-10-CM | POA: Diagnosis not present

## 2016-10-19 DIAGNOSIS — D1801 Hemangioma of skin and subcutaneous tissue: Secondary | ICD-10-CM | POA: Diagnosis not present

## 2016-10-19 DIAGNOSIS — D2261 Melanocytic nevi of right upper limb, including shoulder: Secondary | ICD-10-CM | POA: Diagnosis not present

## 2016-10-19 DIAGNOSIS — L821 Other seborrheic keratosis: Secondary | ICD-10-CM | POA: Diagnosis not present

## 2016-12-16 DIAGNOSIS — M5442 Lumbago with sciatica, left side: Secondary | ICD-10-CM | POA: Diagnosis not present

## 2016-12-16 DIAGNOSIS — G8929 Other chronic pain: Secondary | ICD-10-CM | POA: Diagnosis not present

## 2016-12-22 DIAGNOSIS — M25552 Pain in left hip: Secondary | ICD-10-CM | POA: Diagnosis not present

## 2017-01-24 DIAGNOSIS — Z6833 Body mass index (BMI) 33.0-33.9, adult: Secondary | ICD-10-CM | POA: Diagnosis not present

## 2017-02-14 DIAGNOSIS — R1013 Epigastric pain: Secondary | ICD-10-CM | POA: Diagnosis not present

## 2017-04-04 DIAGNOSIS — B029 Zoster without complications: Secondary | ICD-10-CM | POA: Diagnosis not present

## 2017-08-02 DIAGNOSIS — I10 Essential (primary) hypertension: Secondary | ICD-10-CM | POA: Diagnosis not present

## 2017-08-02 DIAGNOSIS — Z0000 Encounter for general adult medical examination without abnormal findings: Secondary | ICD-10-CM | POA: Diagnosis not present

## 2017-08-02 DIAGNOSIS — Z1211 Encounter for screening for malignant neoplasm of colon: Secondary | ICD-10-CM | POA: Diagnosis not present

## 2017-08-02 DIAGNOSIS — E785 Hyperlipidemia, unspecified: Secondary | ICD-10-CM | POA: Diagnosis not present

## 2017-08-02 DIAGNOSIS — E039 Hypothyroidism, unspecified: Secondary | ICD-10-CM | POA: Diagnosis not present

## 2017-08-02 DIAGNOSIS — F339 Major depressive disorder, recurrent, unspecified: Secondary | ICD-10-CM | POA: Diagnosis not present

## 2017-08-02 DIAGNOSIS — R7303 Prediabetes: Secondary | ICD-10-CM | POA: Diagnosis not present

## 2017-08-02 DIAGNOSIS — D510 Vitamin B12 deficiency anemia due to intrinsic factor deficiency: Secondary | ICD-10-CM | POA: Diagnosis not present

## 2017-08-03 DIAGNOSIS — Z1211 Encounter for screening for malignant neoplasm of colon: Secondary | ICD-10-CM | POA: Diagnosis not present

## 2017-10-09 DIAGNOSIS — R002 Palpitations: Secondary | ICD-10-CM | POA: Diagnosis not present

## 2017-10-11 ENCOUNTER — Other Ambulatory Visit: Payer: Self-pay | Admitting: Family Medicine

## 2017-10-11 DIAGNOSIS — R002 Palpitations: Secondary | ICD-10-CM

## 2017-10-19 DIAGNOSIS — D225 Melanocytic nevi of trunk: Secondary | ICD-10-CM | POA: Diagnosis not present

## 2017-10-19 DIAGNOSIS — L814 Other melanin hyperpigmentation: Secondary | ICD-10-CM | POA: Diagnosis not present

## 2017-10-19 DIAGNOSIS — L821 Other seborrheic keratosis: Secondary | ICD-10-CM | POA: Diagnosis not present

## 2017-10-19 DIAGNOSIS — D224 Melanocytic nevi of scalp and neck: Secondary | ICD-10-CM | POA: Diagnosis not present

## 2017-10-19 DIAGNOSIS — Z85828 Personal history of other malignant neoplasm of skin: Secondary | ICD-10-CM | POA: Diagnosis not present

## 2017-10-19 DIAGNOSIS — L570 Actinic keratosis: Secondary | ICD-10-CM | POA: Diagnosis not present

## 2017-10-19 DIAGNOSIS — D1801 Hemangioma of skin and subcutaneous tissue: Secondary | ICD-10-CM | POA: Diagnosis not present

## 2017-10-20 ENCOUNTER — Other Ambulatory Visit (HOSPITAL_COMMUNITY): Payer: Medicare Other

## 2017-10-24 ENCOUNTER — Other Ambulatory Visit: Payer: Self-pay

## 2017-10-24 ENCOUNTER — Encounter (INDEPENDENT_AMBULATORY_CARE_PROVIDER_SITE_OTHER): Payer: Self-pay

## 2017-10-24 ENCOUNTER — Ambulatory Visit (HOSPITAL_COMMUNITY): Payer: Medicare Other | Attending: Cardiology

## 2017-10-24 DIAGNOSIS — E0590 Thyrotoxicosis, unspecified without thyrotoxic crisis or storm: Secondary | ICD-10-CM | POA: Insufficient documentation

## 2017-10-24 DIAGNOSIS — E785 Hyperlipidemia, unspecified: Secondary | ICD-10-CM | POA: Insufficient documentation

## 2017-10-24 DIAGNOSIS — Z7982 Long term (current) use of aspirin: Secondary | ICD-10-CM | POA: Diagnosis not present

## 2017-10-24 DIAGNOSIS — I10 Essential (primary) hypertension: Secondary | ICD-10-CM | POA: Insufficient documentation

## 2017-10-24 DIAGNOSIS — Z79899 Other long term (current) drug therapy: Secondary | ICD-10-CM | POA: Insufficient documentation

## 2017-10-24 DIAGNOSIS — Z79890 Hormone replacement therapy (postmenopausal): Secondary | ICD-10-CM | POA: Diagnosis not present

## 2017-10-24 DIAGNOSIS — R002 Palpitations: Secondary | ICD-10-CM | POA: Diagnosis not present

## 2017-11-24 DIAGNOSIS — R208 Other disturbances of skin sensation: Secondary | ICD-10-CM | POA: Diagnosis not present

## 2017-12-05 DIAGNOSIS — H6121 Impacted cerumen, right ear: Secondary | ICD-10-CM | POA: Diagnosis not present

## 2018-04-10 DIAGNOSIS — K5792 Diverticulitis of intestine, part unspecified, without perforation or abscess without bleeding: Secondary | ICD-10-CM | POA: Diagnosis not present

## 2018-05-08 ENCOUNTER — Encounter: Payer: Self-pay | Admitting: Gastroenterology

## 2018-06-12 ENCOUNTER — Encounter: Payer: Self-pay | Admitting: Gastroenterology

## 2018-06-12 ENCOUNTER — Ambulatory Visit (INDEPENDENT_AMBULATORY_CARE_PROVIDER_SITE_OTHER): Payer: Medicare Other | Admitting: Gastroenterology

## 2018-06-12 VITALS — BP 144/66 | HR 65 | Ht 59.0 in | Wt 166.0 lb

## 2018-06-12 DIAGNOSIS — K5732 Diverticulitis of large intestine without perforation or abscess without bleeding: Secondary | ICD-10-CM

## 2018-06-12 MED ORDER — HYOSCYAMINE SULFATE 0.125 MG SL SUBL: SUBLINGUAL_TABLET | SUBLINGUAL | 2 refills | Status: DC

## 2018-06-12 NOTE — Progress Notes (Signed)
HPI: This is a very pleasant 72 year old woman who was referred by Dr. Justin Mend for recurrent diverticulitis  Chief complaint is recurrent diverticulitis  Diverticulitis 10 years ago, admitted for 10 days.  Never needed a drain. Was recommended to have surgery but declined.  TAkes Cipro Flagyl periodically for what she believes are recurrent diverticulitis episodes  Lower abd/sometimes LLQ pains, sometimes rectal pressure.  No fevers. Bowels change during the events.  3-4 attacks per year, abx for about a week.  Higher fiber diets are worse for her.  She avoids high fiber diets.  Colonoscopy last was 10 years (normal in [facility]).  She had Cologuard stool test 3 months ago and tells me that it was negative  No family history of colon cancer but her sister and her son have had diverticular problems    Review of systems: Pertinent positive and negative review of systems were noted in the above HPI section. All other review negative.   Past Medical History:  Diagnosis Date  . Arthritis   . Breast cancer ([place])   . Depression   . Diverticulitis   . Hyperlipidemia   . Hypertension   . IBS (irritable bowel syndrome)   . Obesity   . Thyroid disease     Past Surgical History:  Procedure Laterality Date  . MASTECTOMY, RADICAL Bilateral 10 years ago  . ROTATOR CUFF REPAIR      Current Outpatient Medications  Medication Sig Dispense Refill  . amLODipine (NORVASC) 5 MG tablet Take 5 mg by mouth daily.    Marland Kitchen aspirin EC 81 MG tablet Take 81 mg by mouth daily.    Marland Kitchen buPROPion (WELLBUTRIN XL) 300 MG 24 hr tablet Take 300 mg by mouth daily.    . cholecalciferol (VITAMIN D) 1000 UNITS tablet Take 2,000 Units by mouth daily.    Marland Kitchen docusate sodium (COLACE) 100 MG capsule Take 100 mg by mouth daily.    Marland Kitchen levothyroxine (SYNTHROID, LEVOTHROID) 50 MCG tablet Take 50 mcg by mouth daily before breakfast.    . metoprolol tartrate (LOPRESSOR) 100 MG tablet Take 100 mg by mouth 2 (two) times daily.    .  pravastatin (PRAVACHOL) 40 MG tablet Take 40 mg by mouth at bedtime.  3   No current facility-administered medications for this visit.     Allergies as of 06/12/2018  . (No Known Allergies)    Family History  Problem Relation Age of Onset  . Breast cancer Sister   . Ulcerative colitis Sister     Social History   Socioeconomic History  . Marital status: Widowed    Spouse name: Not on file  . Number of children: Not on file  . Years of education: Not on file  . Highest education level: Not on file  Occupational History  . Occupation: Retired Animal nutritionist  . Financial resource strain: Not on file  . Food insecurity:    Worry: Not on file    Inability: Not on file  . Transportation needs:    Medical: Not on file    Non-medical: Not on file  Tobacco Use  . Smoking status: Never Smoker  . Smokeless tobacco: Never Used  Substance and Sexual Activity  . Alcohol use: No  . Drug use: No  . Sexual activity: Not on file  Lifestyle  . Physical activity:    Days per week: Not on file    Minutes per session: Not on file  . Stress: Not on file  Relationships  .  Social connections:    Talks on phone: Not on file    Gets together: Not on file    Attends religious service: Not on file    Active member of club or organization: Not on file    Attends meetings of clubs or organizations: Not on file    Relationship status: Not on file  . Intimate partner violence:    Fear of current or ex partner: Not on file    Emotionally abused: Not on file    Physically abused: Not on file    Forced sexual activity: Not on file  Other Topics Concern  . Not on file  Social History Narrative  . Not on file     Physical Exam: BP (!) 144/66   Pulse 65   Ht 4\' 11"  (1.499 m)   Wt 166 lb (75.3 kg)   BMI 33.53 kg/m  Constitutional: generally well-appearing Psychiatric: alert and oriented x3 Eyes: extraocular movements intact Mouth: oral pharynx moist, no lesions Neck: supple no  lymphadenopathy Cardiovascular: heart regular rate and rhythm Lungs: clear to auscultation bilaterally Abdomen: soft, nontender, nondistended, no obvious ascites, no peritoneal signs, normal bowel sounds Extremities: no lower extremity edema bilaterally Skin: no lesions on visible extremities   Assessment and plan: 72 y.o. female with history of diverticulitis  It does seem that she is having recurrent acute diverticulitis episodes however we do not have any proof that that is the case.  She understands that there can be some overlap between irritable bowel syndrome and his symptoms similar to diverticulitis.  I have given her prescription for sublingual antispasm medicines that she will try the next time she has some serious abdominal pains.  If they do not help she will call here to the office and at that point we will likely get labs including a CBC and complete metabolic profile as well as expedite CT abdomen pelvis to try to prove that she is having repeated episodes of diverticulitis.  She is a former Marine scientist and does understand the idea that any episode of diverticulitis can become severe.  She is potentially interested in sitting down with a surgeon to talk about elective segmental resection as these episodes are very draining for her and share them.  She does need a colonoscopy for colon cancer screening since she had a negative Cologuard just 3 months ago.  She would not need colon cancer screening of any kind for at least 3 years from now.    Please see the "Patient Instructions" section for addition details about the plan.   Owens Loffler, MD [facility] 06/12/2018, 2:41 PM  Cc: Maurice Small, MD

## 2018-06-12 NOTE — Patient Instructions (Addendum)
Levsin SL 0.125mg  take 1-2 PRN spasms, disp 50 with 2 refills. Call if the above does not help.  We have sent the following medications to your pharmacy for you to pick up at your convenience:  Thank you for entrusting me with your care and choosing [facility].  Dr Ardis Hughs

## 2018-11-21 DIAGNOSIS — I10 Essential (primary) hypertension: Secondary | ICD-10-CM | POA: Diagnosis not present

## 2019-01-31 DIAGNOSIS — E785 Hyperlipidemia, unspecified: Secondary | ICD-10-CM | POA: Diagnosis not present

## 2019-01-31 DIAGNOSIS — I10 Essential (primary) hypertension: Secondary | ICD-10-CM | POA: Diagnosis not present

## 2019-01-31 DIAGNOSIS — R7303 Prediabetes: Secondary | ICD-10-CM | POA: Diagnosis not present

## 2019-01-31 DIAGNOSIS — E039 Hypothyroidism, unspecified: Secondary | ICD-10-CM | POA: Diagnosis not present

## 2019-01-31 DIAGNOSIS — D510 Vitamin B12 deficiency anemia due to intrinsic factor deficiency: Secondary | ICD-10-CM | POA: Diagnosis not present

## 2019-02-05 DIAGNOSIS — E039 Hypothyroidism, unspecified: Secondary | ICD-10-CM | POA: Diagnosis not present

## 2019-02-05 DIAGNOSIS — E119 Type 2 diabetes mellitus without complications: Secondary | ICD-10-CM | POA: Diagnosis not present

## 2019-02-05 DIAGNOSIS — F339 Major depressive disorder, recurrent, unspecified: Secondary | ICD-10-CM | POA: Diagnosis not present

## 2019-02-05 DIAGNOSIS — D510 Vitamin B12 deficiency anemia due to intrinsic factor deficiency: Secondary | ICD-10-CM | POA: Diagnosis not present

## 2019-02-05 DIAGNOSIS — E785 Hyperlipidemia, unspecified: Secondary | ICD-10-CM | POA: Diagnosis not present

## 2019-02-05 DIAGNOSIS — I10 Essential (primary) hypertension: Secondary | ICD-10-CM | POA: Diagnosis not present

## 2019-02-05 DIAGNOSIS — M810 Age-related osteoporosis without current pathological fracture: Secondary | ICD-10-CM | POA: Diagnosis not present

## 2019-02-05 DIAGNOSIS — Z1211 Encounter for screening for malignant neoplasm of colon: Secondary | ICD-10-CM | POA: Diagnosis not present

## 2019-02-05 DIAGNOSIS — K589 Irritable bowel syndrome without diarrhea: Secondary | ICD-10-CM | POA: Diagnosis not present

## 2019-02-05 DIAGNOSIS — Z0000 Encounter for general adult medical examination without abnormal findings: Secondary | ICD-10-CM | POA: Diagnosis not present

## 2019-04-03 DIAGNOSIS — L218 Other seborrheic dermatitis: Secondary | ICD-10-CM | POA: Diagnosis not present

## 2019-04-03 DIAGNOSIS — D1801 Hemangioma of skin and subcutaneous tissue: Secondary | ICD-10-CM | POA: Diagnosis not present

## 2019-04-03 DIAGNOSIS — L820 Inflamed seborrheic keratosis: Secondary | ICD-10-CM | POA: Diagnosis not present

## 2019-04-03 DIAGNOSIS — D225 Melanocytic nevi of trunk: Secondary | ICD-10-CM | POA: Diagnosis not present

## 2019-04-03 DIAGNOSIS — L738 Other specified follicular disorders: Secondary | ICD-10-CM | POA: Diagnosis not present

## 2019-04-03 DIAGNOSIS — L853 Xerosis cutis: Secondary | ICD-10-CM | POA: Diagnosis not present

## 2019-04-03 DIAGNOSIS — Z85828 Personal history of other malignant neoplasm of skin: Secondary | ICD-10-CM | POA: Diagnosis not present

## 2019-04-03 DIAGNOSIS — L821 Other seborrheic keratosis: Secondary | ICD-10-CM | POA: Diagnosis not present

## 2019-05-08 DIAGNOSIS — E119 Type 2 diabetes mellitus without complications: Secondary | ICD-10-CM | POA: Diagnosis not present

## 2019-06-07 DIAGNOSIS — Z20828 Contact with and (suspected) exposure to other viral communicable diseases: Secondary | ICD-10-CM | POA: Diagnosis not present

## 2019-06-14 ENCOUNTER — Encounter: Payer: Self-pay | Admitting: Gastroenterology

## 2019-07-02 DIAGNOSIS — E785 Hyperlipidemia, unspecified: Secondary | ICD-10-CM | POA: Insufficient documentation

## 2019-07-02 DIAGNOSIS — F3341 Major depressive disorder, recurrent, in partial remission: Secondary | ICD-10-CM | POA: Insufficient documentation

## 2019-07-02 DIAGNOSIS — D510 Vitamin B12 deficiency anemia due to intrinsic factor deficiency: Secondary | ICD-10-CM | POA: Insufficient documentation

## 2019-07-02 DIAGNOSIS — E039 Hypothyroidism, unspecified: Secondary | ICD-10-CM | POA: Insufficient documentation

## 2019-07-02 DIAGNOSIS — F329 Major depressive disorder, single episode, unspecified: Secondary | ICD-10-CM | POA: Insufficient documentation

## 2019-07-02 DIAGNOSIS — M5432 Sciatica, left side: Secondary | ICD-10-CM | POA: Insufficient documentation

## 2019-07-02 DIAGNOSIS — I10 Essential (primary) hypertension: Secondary | ICD-10-CM | POA: Insufficient documentation

## 2019-07-02 DIAGNOSIS — M810 Age-related osteoporosis without current pathological fracture: Secondary | ICD-10-CM | POA: Insufficient documentation

## 2019-07-02 DIAGNOSIS — K589 Irritable bowel syndrome without diarrhea: Secondary | ICD-10-CM | POA: Insufficient documentation

## 2019-07-02 DIAGNOSIS — K5792 Diverticulitis of intestine, part unspecified, without perforation or abscess without bleeding: Secondary | ICD-10-CM | POA: Insufficient documentation

## 2019-07-02 DIAGNOSIS — E6609 Other obesity due to excess calories: Secondary | ICD-10-CM | POA: Insufficient documentation

## 2019-07-02 DIAGNOSIS — R7303 Prediabetes: Secondary | ICD-10-CM | POA: Insufficient documentation

## 2019-07-02 DIAGNOSIS — C50919 Malignant neoplasm of unspecified site of unspecified female breast: Secondary | ICD-10-CM | POA: Insufficient documentation

## 2019-07-10 ENCOUNTER — Ambulatory Visit (AMBULATORY_SURGERY_CENTER): Payer: Medicare Other

## 2019-07-10 ENCOUNTER — Other Ambulatory Visit: Payer: Self-pay

## 2019-07-10 VITALS — Temp 97.1°F | Ht 59.0 in | Wt 175.0 lb

## 2019-07-10 DIAGNOSIS — Z1159 Encounter for screening for other viral diseases: Secondary | ICD-10-CM

## 2019-07-10 DIAGNOSIS — Z1211 Encounter for screening for malignant neoplasm of colon: Secondary | ICD-10-CM

## 2019-07-10 NOTE — Progress Notes (Signed)
No egg or soy allergy known to patient  No issues with past sedation with any surgeries  or procedures, no intubation problems  No diet pills per patient No home 02 use per patient  No blood thinners per patient  Pt denies issues with constipation  No A fib or A flutter  EMMI video sent to pt's e mail  Pt takes Metformin for prediabetes in the evening, instruct to not take the morning of the procedure. Due to the COVID-19 pandemic we are asking patients to follow these guidelines. Please only bring one care partner. Please be aware that your care partner may wait in the car in the parking lot or if they feel like they will be too hot to wait in the car, they may wait in the lobby on the 4th floor. All care partners are required to wear a mask the entire time (we do not have any that we can provide them), they need to practice social distancing, and we will do a Covid check for all patient's and care partners when you arrive. Also we will check their temperature and your temperature. If the care partner waits in their car they need to stay in the parking lot the entire time and we will call them on their cell phone when the patient is ready for discharge so they can bring the car to the front of the building. Also all patient's will need to wear a mask into building.

## 2019-07-11 ENCOUNTER — Ambulatory Visit (INDEPENDENT_AMBULATORY_CARE_PROVIDER_SITE_OTHER): Payer: Medicare Other

## 2019-07-11 DIAGNOSIS — Z1159 Encounter for screening for other viral diseases: Secondary | ICD-10-CM

## 2019-07-12 LAB — SARS CORONAVIRUS 2 (TAT 6-24 HRS): SARS Coronavirus 2: NEGATIVE

## 2019-07-16 ENCOUNTER — Encounter: Payer: Self-pay | Admitting: Gastroenterology

## 2019-07-16 ENCOUNTER — Other Ambulatory Visit: Payer: Self-pay

## 2019-07-16 ENCOUNTER — Ambulatory Visit (AMBULATORY_SURGERY_CENTER): Payer: Medicare Other | Admitting: Gastroenterology

## 2019-07-16 VITALS — BP 149/75 | HR 65 | Temp 98.3°F | Resp 24 | Ht 59.0 in | Wt 175.0 lb

## 2019-07-16 DIAGNOSIS — I10 Essential (primary) hypertension: Secondary | ICD-10-CM | POA: Diagnosis not present

## 2019-07-16 DIAGNOSIS — K5730 Diverticulosis of large intestine without perforation or abscess without bleeding: Secondary | ICD-10-CM

## 2019-07-16 DIAGNOSIS — D124 Benign neoplasm of descending colon: Secondary | ICD-10-CM

## 2019-07-16 DIAGNOSIS — E785 Hyperlipidemia, unspecified: Secondary | ICD-10-CM | POA: Diagnosis not present

## 2019-07-16 DIAGNOSIS — K635 Polyp of colon: Secondary | ICD-10-CM | POA: Diagnosis not present

## 2019-07-16 DIAGNOSIS — Z1211 Encounter for screening for malignant neoplasm of colon: Secondary | ICD-10-CM

## 2019-07-16 DIAGNOSIS — D122 Benign neoplasm of ascending colon: Secondary | ICD-10-CM

## 2019-07-16 DIAGNOSIS — K6389 Other specified diseases of intestine: Secondary | ICD-10-CM | POA: Diagnosis not present

## 2019-07-16 MED ORDER — SODIUM CHLORIDE 0.9 % IV SOLN: 500.0000 mL | Freq: Once | INTRAVENOUS | Status: DC

## 2019-07-16 NOTE — Patient Instructions (Signed)
Please see handouts given to you on Polyps and Diverticulosis. Thank you for letting us take care of your healthcare needs today.   YOU HAD AN ENDOSCOPIC PROCEDURE TODAY AT [facility]:   Refer to the procedure report that was given to you for any specific questions about what was found during the examination.  If the procedure report does not answer your questions, please call your gastroenterologist to clarify.  If you requested that your care partner not be given the details of your procedure findings, then the procedure report has been included in a sealed envelope for you to review at your convenience later.  YOU SHOULD EXPECT: Some feelings of bloating in the abdomen. Passage of more gas than usual.  Walking can help get rid of the air that was put into your GI tract during the procedure and reduce the bloating. If you had a lower endoscopy (such as a colonoscopy or flexible sigmoidoscopy) you may notice spotting of blood in your stool or on the toilet paper. If you underwent a bowel prep for your procedure, you may not have a normal bowel movement for a few days.  Please Note:  You might notice some irritation and congestion in your nose or some drainage.  This is from the oxygen used during your procedure.  There is no need for concern and it should clear up in a day or so.  SYMPTOMS TO REPORT IMMEDIATELY:   Following lower endoscopy (colonoscopy or flexible sigmoidoscopy):  Excessive amounts of blood in the stool  Significant tenderness or worsening of abdominal pains  Swelling of the abdomen that is new, acute  Fever of 100F or higher     For urgent or emergent issues, a gastroenterologist can be reached at any hour by calling [phone].   DIET:  We do recommend a small meal at first, but then you may proceed to your regular diet.  Drink plenty of fluids but you should avoid alcoholic beverages for 24 hours.  ACTIVITY:  You should plan to take it easy for  the rest of today and you should NOT DRIVE or use heavy machinery until tomorrow (because of the sedation medicines used during the test).    FOLLOW UP: Our staff will call the number listed on your records 48-72 hours following your procedure to check on you and address any questions or concerns that you may have regarding the information given to you following your procedure. If we do not reach you, we will leave a message.  We will attempt to reach you two times.  During this call, we will ask if you have developed any symptoms of COVID 19. If you develop any symptoms (ie: fever, flu-like symptoms, shortness of breath, cough etc.) before then, please call [phone].  If you test positive for Covid 19 in the 2 weeks post procedure, please call and report this information to [place].    If any biopsies were taken you will be contacted by phone or by letter within the next 1-3 weeks.  Please call us at [phone] if you have not heard about the biopsies in 3 weeks.    SIGNATURES/CONFIDENTIALITY: You and/or your care partner have signed paperwork which will be entered into your electronic medical record.  These signatures attest to the fact that that the information above on your After Visit Summary has been reviewed and is understood.  Full responsibility of the confidentiality of this discharge information lies with you and/or your care-partner.

## 2019-07-16 NOTE — Op Note (Signed)
[facility] Patient Name: Jamie Benjamin Procedure Date: 07/16/2019 9:54 AM MRN: [id number] Endoscopist: Milus Banister , MD Age: 73 Referring MD:  Date of Birth: [birth year] Gender: Female Account #: [id number] Procedure:                Colonoscopy Indications:              Recurrent diverticulitis Medicines:                Monitored Anesthesia Care Procedure:                Pre-Anesthesia Assessment:                           - Prior to the procedure, a History and Physical                            was performed, and patient medications and                            allergies were reviewed. The patient's tolerance of                            previous anesthesia was also reviewed. The risks                            and benefits of the procedure and the sedation                            options and risks were discussed with the patient.                            All questions were answered, and informed consent                            was obtained. Prior Anticoagulants: The patient has                            taken no previous anticoagulant or antiplatelet                            agents. ASA Grade Assessment: II - A patient with                            mild systemic disease. After reviewing the risks                            and benefits, the patient was deemed in                            satisfactory condition to undergo the procedure.                           After obtaining informed consent, the colonoscope  was passed under direct vision. Throughout the                            procedure, the patient's blood pressure, pulse, and                            oxygen saturations were monitored continuously. The                            Colonoscope was introduced through the anus and                            advanced to the the cecum, identified by                            appendiceal orifice and ileocecal valve.  The                            colonoscopy was performed without difficulty. The                            patient tolerated the procedure well. The quality                            of the bowel preparation was good. The ileocecal                            valve, appendiceal orifice, and rectum were                            photographed. Scope In: 9:58:56 AM Scope Out: 10:14:42 AM Scope Withdrawal Time: 0 hours 10 minutes 11 seconds  Total Procedure Duration: 0 hours 15 minutes 46 seconds  Findings:                 Two sessile polyps were found in the transverse                            colon and ascending colon. The polyps were 3 to 6                            mm in size. These polyps were removed with a cold                            snare. Resection and retrieval were complete.                           Multiple small and large-mouthed diverticula were                            found in the entire colon. This was most                            significant in the sigmoid region with edematous,  erythematous mucosa and luminal narrowing.                           There was a single 4-5cm long, chronic appearing                            erosion in the proximal sigmoid colon. This was                            just proximal to the most significant diverticular                            changes and was sampled with forceps (?mild                            ischemic damage).                           The exam was otherwise without abnormality on                            direct and retroflexion views. Complications:            No immediate complications. Estimated blood loss:                            None. Estimated Blood Loss:     Estimated blood loss: none. Impression:               - Two 3 to 6 mm polyps in the transverse colon and                            in the ascending colon, removed with a cold snare.                             Resected and retrieved.                           - Diverticulosis in the entire examined colon. This                            was most significant in the sigmoid region with                            edematous, erythematous mucosa and luminal                            narrowing.                           - There was a single 4-5cm long, chronic appearing                            erosion in the proximal sigmoid colon. This was  just proximal to the most significant diverticular                            changes and was sampled with forceps (?mild                            ischemic damage).                           - The examination was otherwise normal on direct                            and retroflexion views. Recommendation:           - Patient has a contact number available for                            emergencies. The signs and symptoms of potential                            delayed complications were discussed with the                            patient. Return to normal activities tomorrow.                            Written discharge instructions were provided to the                            patient.                           - Resume previous diet.                           - Continue present medications.                           - Await pathology results. Milus Banister, MD 07/16/2019 10:25:20 AM This report has been signed electronically.

## 2019-07-16 NOTE — Progress Notes (Signed)
Pt's states no medical or surgical changes since previsit or office visit.  Temp-ch  V/s-dt

## 2019-07-16 NOTE — Progress Notes (Signed)
Report to PACU, RN, vss, BBS= Clear.  

## 2019-07-16 NOTE — Progress Notes (Signed)
Called to room to assist during endoscopic procedure.  Patient ID and intended procedure confirmed with present staff. Received instructions for my participation in the procedure from the performing physician.  

## 2019-07-18 ENCOUNTER — Telehealth: Payer: Self-pay | Admitting: *Deleted

## 2019-07-18 NOTE — Telephone Encounter (Signed)
  Follow up Call-  Call back number 07/16/2019  Post procedure Call Back phone  # [phone]  Permission to leave phone message Yes  Some recent data might be hidden     Patient questions:  Do you have a fever, pain , or abdominal swelling? No. Pain Score  0 *  Have you tolerated food without any problems? Yes.    Have you been able to return to your normal activities? Yes.    Do you have any questions about your discharge instructions: Diet   No. Medications  No. Follow up visit  No.  Do you have questions or concerns about your Care? No.  Actions: * If pain score is 4 or above: 1. No action needed, pain <4.Have you developed a fever since your procedure? no  2.   Have you had an respiratory symptoms (SOB or cough) since your procedure? no  3.   Have you tested positive for COVID 19 since your procedure no  4.   Have you had any family members/close contacts diagnosed with the COVID 19 since your procedure?  no   If yes to any of these questions please route to Joylene John, RN and Alphonsa Gin, Therapist, sports.

## 2019-08-27 DIAGNOSIS — K5792 Diverticulitis of intestine, part unspecified, without perforation or abscess without bleeding: Secondary | ICD-10-CM | POA: Diagnosis not present

## 2019-09-10 ENCOUNTER — Telehealth: Payer: Self-pay | Admitting: Gastroenterology

## 2019-09-10 DIAGNOSIS — K5732 Diverticulitis of large intestine without perforation or abscess without bleeding: Secondary | ICD-10-CM

## 2019-09-10 DIAGNOSIS — R109 Unspecified abdominal pain: Secondary | ICD-10-CM

## 2019-09-10 NOTE — Telephone Encounter (Signed)
The pt has been scheduled for CT scan at [facility] radiology on 09/16/19 at 230 pm.  Nothing to eat or drink 4 hours prior to the appt.  She will need to pick up contrast and have labs prior.  The patient has been notified of this information and all questions answered. The pt has been advised of the information and verbalized understanding.

## 2019-09-10 NOTE — Telephone Encounter (Signed)
Yes, CT scan abdomen pelvis with IV and oral contrast.  Also CBC.  Thanks

## 2019-09-10 NOTE — Telephone Encounter (Signed)
Dr Ardis Hughs the pt states that she has abd pain and symptoms of diverticulitis.  She was referred to [facility] but had to cancel due to Covid exposure.  She says that you spoke with her son about having a CT scan completed.  Please advise.

## 2019-09-12 ENCOUNTER — Other Ambulatory Visit (INDEPENDENT_AMBULATORY_CARE_PROVIDER_SITE_OTHER): Payer: Medicare Other

## 2019-09-12 DIAGNOSIS — K5732 Diverticulitis of large intestine without perforation or abscess without bleeding: Secondary | ICD-10-CM | POA: Diagnosis not present

## 2019-09-12 DIAGNOSIS — R109 Unspecified abdominal pain: Secondary | ICD-10-CM | POA: Diagnosis not present

## 2019-09-12 LAB — CBC WITH DIFFERENTIAL/PLATELET
Basophils Absolute: 0.1 10*3/uL (ref 0.0–0.1)
Basophils Relative: 0.8 % (ref 0.0–3.0)
Eosinophils Absolute: 0.2 10*3/uL (ref 0.0–0.7)
Eosinophils Relative: 2.1 % (ref 0.0–5.0)
HCT: 45.4 % (ref 36.0–46.0)
Hemoglobin: 15.7 g/dL — ABNORMAL HIGH (ref 12.0–15.0)
Lymphocytes Relative: 20.6 % (ref 12.0–46.0)
Lymphs Abs: 1.6 10*3/uL (ref 0.7–4.0)
MCHC: 34.5 g/dL (ref 30.0–36.0)
MCV: 91.9 fl (ref 78.0–100.0)
Monocytes Absolute: 0.6 10*3/uL (ref 0.1–1.0)
Monocytes Relative: 8.3 % (ref 3.0–12.0)
Neutro Abs: 5.1 10*3/uL (ref 1.4–7.7)
Neutrophils Relative %: 68.2 % (ref 43.0–77.0)
Platelets: 306 10*3/uL (ref 150.0–400.0)
RBC: 4.94 Mil/uL (ref 3.87–5.11)
RDW: 13.2 % (ref 11.5–15.5)
WBC: 7.5 10*3/uL (ref 4.0–10.5)

## 2019-09-12 LAB — CREATININE, SERUM: Creatinine, Ser: 1.34 mg/dL — ABNORMAL HIGH (ref 0.40–1.20)

## 2019-09-12 LAB — BUN: BUN: 26 mg/dL — ABNORMAL HIGH (ref 6–23)

## 2019-09-16 ENCOUNTER — Other Ambulatory Visit: Payer: Self-pay

## 2019-09-16 ENCOUNTER — Ambulatory Visit (HOSPITAL_COMMUNITY)
Admission: RE | Admit: 2019-09-16 | Discharge: 2019-09-16 | Disposition: A | Discharge status: Home / Self Care | Payer: Medicare Other | Source: Ambulatory Visit | Attending: Gastroenterology | Admitting: Gastroenterology

## 2019-09-16 DIAGNOSIS — K5730 Diverticulosis of large intestine without perforation or abscess without bleeding: Secondary | ICD-10-CM | POA: Diagnosis not present

## 2019-09-16 DIAGNOSIS — R109 Unspecified abdominal pain: Secondary | ICD-10-CM | POA: Diagnosis not present

## 2019-09-16 DIAGNOSIS — K5732 Diverticulitis of large intestine without perforation or abscess without bleeding: Secondary | ICD-10-CM

## 2019-09-16 DIAGNOSIS — K5790 Diverticulosis of intestine, part unspecified, without perforation or abscess without bleeding: Secondary | ICD-10-CM | POA: Diagnosis not present

## 2019-09-16 MED ORDER — SODIUM CHLORIDE (PF) 0.9 % IJ SOLN
INTRAMUSCULAR | Status: AC
  Filled 2019-09-16: qty 50

## 2019-09-16 MED ORDER — IOHEXOL 300 MG/ML  SOLN
75.0000 mL | Freq: Once | INTRAMUSCULAR | Status: AC | PRN
  Administered 2019-09-16: 15:00:00 75 mL via INTRAVENOUS

## 2019-10-01 ENCOUNTER — Encounter: Payer: Self-pay | Admitting: Physician Assistant

## 2019-10-01 ENCOUNTER — Other Ambulatory Visit: Payer: Self-pay

## 2019-10-01 ENCOUNTER — Ambulatory Visit (INDEPENDENT_AMBULATORY_CARE_PROVIDER_SITE_OTHER): Payer: Medicare Other | Admitting: Physician Assistant

## 2019-10-01 VITALS — BP 136/76 | HR 84 | Temp 97.9°F | Ht 59.0 in | Wt 175.0 lb

## 2019-10-01 DIAGNOSIS — K5730 Diverticulosis of large intestine without perforation or abscess without bleeding: Secondary | ICD-10-CM | POA: Diagnosis not present

## 2019-10-01 NOTE — Progress Notes (Signed)
Subjective:    Patient ID: Jamie Benjamin, female    DOB: 1946/08/01, 74 y.o.   MRN: [id number]  Jamie Benjamin is a pleasant 74 year old female, established with Jamie Benjamin who comes in today with complaints of abdominal pain. Patient has history of hypertension, IBS, diverticulosis with prior diverticulitis, history of breast cancer hypertension and obesity. She had undergone colonoscopy in December 2020 with finding of 2 sessile polyps measuring 3 to 6 mm which were removed. Path consistent with hyperplastic polyps. She was noted to have multiple diverticuli throughout the entire colon was concentrated in the sigmoid where there was also luminal narrowing. She also had a chronic appearing 4 to 5 cm erosion in the proximal sigmoid-biopsy of this area showed polypoid colonic mucosa with fibrosis. Patient had been having lower abdominal pain and had called in February with concerns for possible diverticulitis. She underwent CT scan of the abdomen and pelvis on 09/16/2019 which showed a T12 wedge compression fracture which is old, there was diverticulosis without diverticulitis, and a 3 to 4 mm pulmonary nodule.  Her PCP then gave her a trial of Levsin 1 p.o. 3 times daily. This is helped quite a bit and she says her abdominal pain has resolved. She says her symptoms were very similar to episodes of diverticulitis, the only difference she can tell is that sometimes the pain with diverticulitis is stronger and she will also developed rectal pressure. She has never had fever with diverticulitis. She has had some mild intermittent constipation but otherwise has been feeling good since starting Levsin on a scheduled basis.  Review of Systems Pertinent positive and negative review of systems were noted in the above Jamie section.  All other review of systems was otherwise negative.  Outpatient Encounter Medications as of 10/01/2019  Medication Sig  . amLODipine (NORVASC) 10 MG tablet Take 10 mg by mouth daily.    Marland Kitchen aspirin EC 81 MG tablet Take 81 mg by mouth daily.  Marland Kitchen buPROPion (WELLBUTRIN XL) 300 MG 24 hr tablet Take 300 mg by mouth daily.  . cholecalciferol (VITAMIN D) 1000 UNITS tablet Take 2,000 Units by mouth daily.  . hydrocortisone (ANUSOL-HC) 25 MG suppository Place 25 mg rectally 2 (two) times daily.  . hyoscyamine (LEVSIN) 0.125 MG tablet Take 0.125 mg by mouth in the morning, at noon, and at bedtime.  Marland Kitchen levothyroxine (SYNTHROID, LEVOTHROID) 50 MCG tablet Take 50 mcg by mouth daily before breakfast.  . Melatonin 10 MG TABS Take by mouth at bedtime as needed.  . metFORMIN (GLUCOPHAGE-XR) 500 MG 24 hr tablet Take 500 mg by mouth at bedtime.  . metoprolol tartrate (LOPRESSOR) 100 MG tablet Take 100 mg by mouth 2 (two) times daily.  . pravastatin (PRAVACHOL) 40 MG tablet Take 40 mg by mouth at bedtime.  . [DISCONTINUED] Blood Glucose Monitoring Suppl (ONE TOUCH ULTRA 2) w/Device KIT USE ONCE DAILY TO CHECK BLOOD SUGAR AT DIFFERENT TIMES OF THE DAY  . [DISCONTINUED] docusate sodium (COLACE) 100 MG capsule Take 100 mg by mouth daily.  . [DISCONTINUED] hyoscyamine (LEVSIN SL) 0.125 MG SL tablet Take 1-2 SL as needed for spasms   No facility-administered encounter medications on file as of 10/01/2019.   Allergies  Allergen Reactions  . Flu Virus Vaccine Other (See Comments)   Patient Active Problem List   Diagnosis Date Noted  . Breast cancer ([place]) 07/02/2019  . Diverticulitis 07/02/2019  . Hyperlipidemia 07/02/2019  . Hypertension 07/02/2019  . IBS (irritable bowel syndrome) 07/02/2019  . Hypothyroidism  07/02/2019  . Major depressive disorder with current active episode 07/02/2019  . Morbid obesity ([place]) 07/02/2019  . Osteoporosis 07/02/2019  . Pernicious anemia 07/02/2019  . Prediabetes 07/02/2019  . Recurrent major depressive disorder, in partial remission ([place]) 07/02/2019  . Sciatica of left side 07/02/2019   Social History   Socioeconomic History  . Marital status: Widowed     Spouse name: Not on file  . Number of children: Not on file  . Years of education: Not on file  . Highest education level: Not on file  Occupational History  . Occupation: Retired Therapist, sports  Tobacco Use  . Smoking status: Never Smoker  . Smokeless tobacco: Never Used  Substance and Sexual Activity  . Alcohol use: No  . Drug use: No  . Sexual activity: Not on file  Other Topics Concern  . Not on file  Social History Narrative  . Not on file   Social Determinants of Health   Financial Resource Strain:   . Difficulty of Paying Living Expenses: Not on file  Food Insecurity:   . Worried About Charity fundraiser in the Last Year: Not on file  . Ran Out of Food in the Last Year: Not on file  Transportation Needs:   . Lack of Transportation (Medical): Not on file  . Lack of Transportation (Non-Medical): Not on file  Physical Activity:   . Days of Exercise per Week: Not on file  . Minutes of Exercise per Session: Not on file  Stress:   . Feeling of Stress : Not on file  Social Connections:   . Frequency of Communication with Friends and Family: Not on file  . Frequency of Social Gatherings with Friends and Family: Not on file  . Attends Religious Services: Not on file  . Active Member of Clubs or Organizations: Not on file  . Attends Archivist Meetings: Not on file  . Marital Status: Not on file  Intimate Partner Violence:   . Fear of Current or Ex-Partner: Not on file  . Emotionally Abused: Not on file  . Physically Abused: Not on file  . Sexually Abused: Not on file    [name]'s family history includes Breast cancer in her sister; Ulcerative colitis in her sister.      Objective:    Vitals:   10/01/19 1406  BP: 136/76  Pulse: 84  Temp: 97.9 F (36.6 C)    Physical Exam Well-developed well-nourished  in no acute distress.   VHQION,629  BMI35  HEENT; nontraumatic normocephalic, EOMI, PER R LA, sclera anicteric. Oropharynx; not examined Neck; supple, no  JVD Cardiovascular; regular rate and rhythm with S1-S2, no murmur rub or gallop Pulmonary; Clear bilaterally Abdomen; soft, nontender, nondistended, no palpable mass or hepatosplenomegaly, bowel sounds are active Rectal; Skin; benign exam, no jaundice rash or appreciable lesions Extremities; no clubbing cyanosis or edema skin warm and dry Neuro/Psych; alert and oriented x4, grossly nonfocal mood and affect appropriate       Assessment & Plan:   #28 74 year old white female with recent lower abdominal pain in setting of known extensive diverticulosis especially in the sigmoid colon with luminal narrowing. Recent CT negative for diverticulitis, and symptoms resolved on scheduled Levsin. I think she has symptomatic diverticulosis with spasm.  #2 history of colon polyps up-to-date with colon screening just done December 2020 with removal of 2 hyperplastic polyps. #3 history of breast cancer #4 hypertension  Plan; continue Levsin, with scheduled dosing- suggested she may be able  to decrease Levsin to twice daily and may give this a trial. For constipation advised MiraLAX 17 g in 8 ounces of water 1-2 doses daily as needed She will stay off of fiber Follow-up with Jamie Benjamin or myself on an as-needed basis.  Ashauna Bertholf Genia Harold PA-C 10/01/2019   Cc: Maurice Small, MD

## 2019-10-01 NOTE — Patient Instructions (Signed)
Use Miralax 17 gm in 8 ounces of water  as needed  Continue Levsin you can start two times daily  Call Dr Ardis Hughs for problems or any flares of diverticulitis  If you are age 75 or older, your body mass index should be between 23-30. Your Body mass index is 35.35 kg/m. If this is out of the aforementioned range listed, please consider follow up with your Primary Care Provider.  If you are age 4 or younger, your body mass index should be between 19-25. Your Body mass index is 35.35 kg/m. If this is out of the aformentioned range listed, please consider follow up with your Primary Care Provider.    I appreciate the  opportunity to care for you  Thank You   Amy Joya Gaskins, PA-C

## 2019-10-02 NOTE — Progress Notes (Signed)
I agree with the above note, plan 

## 2019-10-29 ENCOUNTER — Encounter: Payer: Self-pay | Admitting: Physician Assistant

## 2019-10-29 ENCOUNTER — Ambulatory Visit (INDEPENDENT_AMBULATORY_CARE_PROVIDER_SITE_OTHER): Payer: Medicare Other | Admitting: Physician Assistant

## 2019-10-29 VITALS — BP 142/70 | HR 68 | Temp 98.5°F | Ht <= 58 in | Wt 169.4 lb

## 2019-10-29 DIAGNOSIS — K5792 Diverticulitis of intestine, part unspecified, without perforation or abscess without bleeding: Secondary | ICD-10-CM

## 2019-10-29 MED ORDER — HYOSCYAMINE SULFATE 0.125 MG PO TABS: 0.1250 mg | ORAL_TABLET | Freq: Three times a day (TID) | ORAL | 1 refills | Status: DC

## 2019-10-29 MED ORDER — MESALAMINE 1.2 G PO TBEC: 1.2000 g | DELAYED_RELEASE_TABLET | Freq: Two times a day (BID) | ORAL | 8 refills | Status: DC

## 2019-10-29 NOTE — Progress Notes (Signed)
Subjective:    Patient ID: Jamie Benjamin, female    DOB: [birth year], 74 y.o.   MRN: [id number]  HPI Jamie Benjamin he is a pleasant 74 year old white female, known to Dr. Ardis Benjamin, and myself.  She has history of recurrent diverticulitis and IBS. She was seen earlier this month on 10/01/2019 after an episode of lower abdominal pain in February with concerns for possible diverticulitis.  She had undergone CT of the abdomen and pelvis on 09/16/2019 which showed a T12 wedge compression fracture which was old and diverticulosis without diverticulitis.  Her PCP had started her on a trial of Levsin 3 times daily which had helped.. At colonoscopy December 2020 she was noted to have multiple small and widemouth diverticuli throughout the colon, most concentrated in the sigmoid where there was also luminal narrowing.  She had 2 small polyps removed which were hyperplastic and also was noted to have a chronic appearing 4 to 5 cm erosion in the proximal sigmoid.  This was just above the area of most extensive diverticulosis.  This was biopsied and showed benign polypoid colonic mucosa with fibrosis. She has continued on Levsin twice daily. About 3 weeks ago she developed an episode of chills, without documented fever.  This was associated with an urge for bowel movement and rectal pressure/discomfort and blood tinged mucus from the rectum.  The symptoms lasted for couple of days and then she started seeing some blood with her bowel movements.  She never had any diarrhea, no associated abdominal pain or cramping and no nausea or vomiting.  She says she had tried to call here but was unable to get in and her son gave her a course of Augmentin which she took for 10 days.  It has been a week since she finished the antibiotics.  That that episode lasted 4 to 5 days in total. She has not seen any further blood since that time her mucus and bowel movements have been normal.  She is frustrated by all of these recurrent GI  symptoms.  Review of Systems Pertinent positive and negative review of systems were noted in the above HPI section.  All other review of systems was otherwise negative.  Outpatient Encounter Medications as of 10/29/2019  Medication Sig  . amLODipine (NORVASC) 10 MG tablet Take 10 mg by mouth daily.  Marland Kitchen aspirin EC 81 MG tablet Take 81 mg by mouth daily.  Marland Kitchen buPROPion (WELLBUTRIN XL) 300 MG 24 hr tablet Take 300 mg by mouth daily.  . cholecalciferol (VITAMIN D) 1000 UNITS tablet Take 2,000 Units by mouth daily.  . hydrocortisone (ANUSOL-HC) 25 MG suppository Place 25 mg rectally as needed.   . hyoscyamine (LEVSIN) 0.125 MG tablet Take 1 tablet (0.125 mg total) by mouth in the morning, at noon, and at bedtime.  Marland Kitchen levothyroxine (SYNTHROID, LEVOTHROID) 50 MCG tablet Take 50 mcg by mouth daily before breakfast.  . Melatonin 10 MG TABS Take by mouth at bedtime as needed.  . metFORMIN (GLUCOPHAGE-XR) 500 MG 24 hr tablet Take 500 mg by mouth at bedtime.  . metoprolol tartrate (LOPRESSOR) 100 MG tablet Take 100 mg by mouth 2 (two) times daily.  . pravastatin (PRAVACHOL) 40 MG tablet Take 40 mg by mouth at bedtime.  . [DISCONTINUED] hyoscyamine (LEVSIN) 0.125 MG tablet Take 0.125 mg by mouth in the morning, at noon, and at bedtime.  . mesalamine (LIALDA) 1.2 g EC tablet Take 1 tablet (1.2 g total) by mouth 2 (two) times daily.   No facility-administered  encounter medications on file as of 10/29/2019.   Allergies  Allergen Reactions  . Flu Virus Vaccine Other (See Comments)   Patient Active Problem List   Diagnosis Date Noted  . Breast cancer ([place]) 07/02/2019  . Diverticulitis 07/02/2019  . Hyperlipidemia 07/02/2019  . Hypertension 07/02/2019  . IBS (irritable bowel syndrome) 07/02/2019  . Hypothyroidism 07/02/2019  . Major depressive disorder with current active episode 07/02/2019  . Morbid obesity ([place]) 07/02/2019  . Osteoporosis 07/02/2019  . Pernicious anemia 07/02/2019  . Prediabetes  07/02/2019  . Recurrent major depressive disorder, in partial remission ([place]) 07/02/2019  . Sciatica of left side 07/02/2019   Social History   Socioeconomic History  . Marital status: Widowed    Spouse name: Not on file  . Number of children: Not on file  . Years of education: Not on file  . Highest education level: Not on file  Occupational History  . Occupation: Retired Therapist, sports  Tobacco Use  . Smoking status: Never Smoker  . Smokeless tobacco: Never Used  Substance and Sexual Activity  . Alcohol use: No  . Drug use: No  . Sexual activity: Not on file  Other Topics Concern  . Not on file  Social History Narrative  . Not on file   Social Determinants of Health   Financial Resource Strain:   . Difficulty of Paying Living Expenses:   Food Insecurity:   . Worried About Charity fundraiser in the Last Year:   . Arboriculturist in the Last Year:   Transportation Needs:   . Film/video editor (Medical):   Marland Kitchen Lack of Transportation (Non-Medical):   Physical Activity:   . Days of Exercise per Week:   . Minutes of Exercise per Session:   Stress:   . Feeling of Stress :   Social Connections:   . Frequency of Communication with Friends and Family:   . Frequency of Social Gatherings with Friends and Family:   . Attends Religious Services:   . Active Member of Clubs or Organizations:   . Attends Archivist Meetings:   Marland Kitchen Marital Status:   Intimate Partner Violence:   . Fear of Current or Ex-Partner:   . Emotionally Abused:   Marland Kitchen Physically Abused:   . Sexually Abused:     Ms. Jamie Benjamin family history includes Breast cancer in her sister; Ulcerative colitis in her sister.      Objective:    Vitals:   10/29/19 1427  BP: (!) 142/70  Pulse: 68  Temp: 98.5 F (36.9 C)    Physical Exam Well-developed well-nourished older white female in no acute distress.  Height, Weight, 169 BMI 36.3  HEENT; nontraumatic normocephalic, EOMI, PER R LA, sclera  anicteric. Oropharynx; not examined Neck; supple, no JVD Cardiovascular; regular rate and rhythm with S1-S2, no murmur rub or gallop Pulmonary; Clear bilaterally Abdomen; soft, nontender, nondistended, no palpable mass or hepatosplenomegaly, bowel sounds are active Rectal; not done today Skin; benign exam, no jaundice rash or appreciable lesions Extremities; no clubbing cyanosis or edema skin warm and dry Neuro/Psych; alert and oriented x4, grossly nonfocal mood and affect appropriate       Assessment & Plan:   #32 74 year old female with extensive pandiverticulosis and history of recurrent diverticulitis. Patient has component of IBS/symptomatic diverticulosis with intermittent lower abdominal discomfort/cramping.  This has improved with Levsin. Recent episode of rectal pressure, urge for bowel movements, then few day history of mucus and blood in stool and from rectum.  There is no associated abdominal pain. 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 twice daily dosed regularly. We will start trial of Lialda 1.2 g p.o. twice daily. Discussed use of MiraLAX 1/2-1 full dose as needed for constipation. We will plan follow-up with Dr. Ardis Benjamin or myself in 3 to 4 months, patient will call in the interim for problems.  She was advised to call should she have any recurrence of above colitic type symptoms and at that point may consider flex sig morning.  Dejuana Weist Genia Harold PA-C 10/29/2019   Cc: Maurice Small, MD

## 2019-10-29 NOTE — Patient Instructions (Addendum)
If you are age 75 or older, your body mass index should be between 23-30. Your Body mass index is 36.33 kg/m. If this is out of the aforementioned range listed, please consider follow up with your Primary Care Provider.  If you are age 89 or younger, your body mass index should be between 19-25. Your Body mass index is 36.33 kg/m. If this is out of the aformentioned range listed, please consider follow up with your Primary Care Provider.   Continue Levsin 0.125 mg 1 tablet twice daily  START Lialda 1.2 gm 1 tablet twice daily  Call and ask for Amy Esterwood's nurse for any recurrent symptoms.  Please contact the office in 3-4 months to schedule a follow up with Dr. Ardis Hughs.

## 2019-10-30 ENCOUNTER — Telehealth: Payer: Self-pay | Admitting: Physician Assistant

## 2019-10-30 MED ORDER — MESALAMINE 1.2 G PO TBEC: 1.2000 g | DELAYED_RELEASE_TABLET | Freq: Two times a day (BID) | ORAL | 8 refills | Status: DC

## 2019-10-30 NOTE — Telephone Encounter (Signed)
Mesalamine has been resent to pharmacy.

## 2019-10-31 NOTE — Progress Notes (Signed)
I agree with the above note, plan 

## 2020-02-07 DIAGNOSIS — M1990 Unspecified osteoarthritis, unspecified site: Secondary | ICD-10-CM | POA: Diagnosis not present

## 2020-02-07 DIAGNOSIS — E039 Hypothyroidism, unspecified: Secondary | ICD-10-CM | POA: Diagnosis not present

## 2020-02-07 DIAGNOSIS — Z0000 Encounter for general adult medical examination without abnormal findings: Secondary | ICD-10-CM | POA: Diagnosis not present

## 2020-02-07 DIAGNOSIS — E119 Type 2 diabetes mellitus without complications: Secondary | ICD-10-CM | POA: Diagnosis not present

## 2020-02-07 DIAGNOSIS — D510 Vitamin B12 deficiency anemia due to intrinsic factor deficiency: Secondary | ICD-10-CM | POA: Diagnosis not present

## 2020-02-07 DIAGNOSIS — E785 Hyperlipidemia, unspecified: Secondary | ICD-10-CM | POA: Diagnosis not present

## 2020-02-07 DIAGNOSIS — Z7984 Long term (current) use of oral hypoglycemic drugs: Secondary | ICD-10-CM | POA: Diagnosis not present

## 2020-02-07 DIAGNOSIS — I10 Essential (primary) hypertension: Secondary | ICD-10-CM | POA: Diagnosis not present

## 2020-02-17 DIAGNOSIS — E1121 Type 2 diabetes mellitus with diabetic nephropathy: Secondary | ICD-10-CM | POA: Diagnosis not present

## 2020-02-17 DIAGNOSIS — Z5181 Encounter for therapeutic drug level monitoring: Secondary | ICD-10-CM | POA: Diagnosis not present

## 2020-02-17 DIAGNOSIS — R809 Proteinuria, unspecified: Secondary | ICD-10-CM | POA: Diagnosis not present

## 2020-02-19 DIAGNOSIS — M1812 Unilateral primary osteoarthritis of first carpometacarpal joint, left hand: Secondary | ICD-10-CM | POA: Diagnosis not present

## 2020-02-19 DIAGNOSIS — G5602 Carpal tunnel syndrome, left upper limb: Secondary | ICD-10-CM | POA: Diagnosis not present

## 2020-02-19 DIAGNOSIS — M25532 Pain in left wrist: Secondary | ICD-10-CM | POA: Diagnosis not present

## 2020-03-03 DIAGNOSIS — I10 Essential (primary) hypertension: Secondary | ICD-10-CM | POA: Diagnosis not present

## 2020-03-16 DIAGNOSIS — I10 Essential (primary) hypertension: Secondary | ICD-10-CM | POA: Diagnosis not present

## 2020-04-01 DIAGNOSIS — R05 Cough: Secondary | ICD-10-CM | POA: Diagnosis not present

## 2020-04-01 DIAGNOSIS — R0982 Postnasal drip: Secondary | ICD-10-CM | POA: Diagnosis not present

## 2020-04-15 DIAGNOSIS — I10 Essential (primary) hypertension: Secondary | ICD-10-CM | POA: Diagnosis not present

## 2020-04-22 DIAGNOSIS — R05 Cough: Secondary | ICD-10-CM | POA: Diagnosis not present

## 2020-05-08 DIAGNOSIS — R809 Proteinuria, unspecified: Secondary | ICD-10-CM | POA: Diagnosis not present

## 2020-05-08 DIAGNOSIS — N281 Cyst of kidney, acquired: Secondary | ICD-10-CM | POA: Diagnosis not present

## 2020-05-08 DIAGNOSIS — M899 Disorder of bone, unspecified: Secondary | ICD-10-CM | POA: Diagnosis not present

## 2020-05-08 DIAGNOSIS — N1832 Chronic kidney disease, stage 3b: Secondary | ICD-10-CM | POA: Diagnosis not present

## 2020-05-08 DIAGNOSIS — I129 Hypertensive chronic kidney disease with stage 1 through stage 4 chronic kidney disease, or unspecified chronic kidney disease: Secondary | ICD-10-CM | POA: Diagnosis not present

## 2020-05-08 DIAGNOSIS — D631 Anemia in chronic kidney disease: Secondary | ICD-10-CM | POA: Diagnosis not present

## 2020-05-11 DIAGNOSIS — N1832 Chronic kidney disease, stage 3b: Secondary | ICD-10-CM | POA: Diagnosis not present

## 2020-05-11 DIAGNOSIS — E119 Type 2 diabetes mellitus without complications: Secondary | ICD-10-CM | POA: Diagnosis not present

## 2020-05-11 DIAGNOSIS — I10 Essential (primary) hypertension: Secondary | ICD-10-CM | POA: Diagnosis not present

## 2020-05-15 ENCOUNTER — Other Ambulatory Visit: Payer: Self-pay | Admitting: Nephrology

## 2020-05-15 DIAGNOSIS — N281 Cyst of kidney, acquired: Secondary | ICD-10-CM

## 2020-05-15 DIAGNOSIS — E1122 Type 2 diabetes mellitus with diabetic chronic kidney disease: Secondary | ICD-10-CM | POA: Diagnosis not present

## 2020-05-15 DIAGNOSIS — N1832 Chronic kidney disease, stage 3b: Secondary | ICD-10-CM

## 2020-05-15 DIAGNOSIS — N183 Chronic kidney disease, stage 3 unspecified: Secondary | ICD-10-CM | POA: Diagnosis not present

## 2020-05-19 DIAGNOSIS — L738 Other specified follicular disorders: Secondary | ICD-10-CM | POA: Diagnosis not present

## 2020-05-19 DIAGNOSIS — L821 Other seborrheic keratosis: Secondary | ICD-10-CM | POA: Diagnosis not present

## 2020-05-19 DIAGNOSIS — L814 Other melanin hyperpigmentation: Secondary | ICD-10-CM | POA: Diagnosis not present

## 2020-05-19 DIAGNOSIS — Z85828 Personal history of other malignant neoplasm of skin: Secondary | ICD-10-CM | POA: Diagnosis not present

## 2020-05-19 DIAGNOSIS — D1801 Hemangioma of skin and subcutaneous tissue: Secondary | ICD-10-CM | POA: Diagnosis not present

## 2020-05-19 DIAGNOSIS — L820 Inflamed seborrheic keratosis: Secondary | ICD-10-CM | POA: Diagnosis not present

## 2020-05-19 DIAGNOSIS — D225 Melanocytic nevi of trunk: Secondary | ICD-10-CM | POA: Diagnosis not present

## 2020-05-21 ENCOUNTER — Other Ambulatory Visit: Payer: Medicare Other

## 2020-05-27 ENCOUNTER — Ambulatory Visit
Admission: RE | Admit: 2020-05-27 | Discharge: 2020-05-27 | Disposition: A | Discharge status: Home / Self Care | Payer: Medicare Other | Source: Ambulatory Visit | Attending: Nephrology | Admitting: Nephrology

## 2020-05-27 DIAGNOSIS — N281 Cyst of kidney, acquired: Secondary | ICD-10-CM | POA: Diagnosis not present

## 2020-05-27 DIAGNOSIS — N1832 Chronic kidney disease, stage 3b: Secondary | ICD-10-CM

## 2020-05-28 DIAGNOSIS — Z23 Encounter for immunization: Secondary | ICD-10-CM | POA: Diagnosis not present

## 2020-06-09 DIAGNOSIS — I10 Essential (primary) hypertension: Secondary | ICD-10-CM | POA: Diagnosis not present

## 2020-06-24 DIAGNOSIS — R809 Proteinuria, unspecified: Secondary | ICD-10-CM | POA: Diagnosis not present

## 2020-06-24 DIAGNOSIS — N1832 Chronic kidney disease, stage 3b: Secondary | ICD-10-CM | POA: Diagnosis not present

## 2020-06-24 DIAGNOSIS — D631 Anemia in chronic kidney disease: Secondary | ICD-10-CM | POA: Diagnosis not present

## 2020-06-24 DIAGNOSIS — I129 Hypertensive chronic kidney disease with stage 1 through stage 4 chronic kidney disease, or unspecified chronic kidney disease: Secondary | ICD-10-CM | POA: Diagnosis not present

## 2020-07-02 DIAGNOSIS — L650 Telogen effluvium: Secondary | ICD-10-CM | POA: Diagnosis not present

## 2020-07-02 DIAGNOSIS — Z85828 Personal history of other malignant neoplasm of skin: Secondary | ICD-10-CM | POA: Diagnosis not present

## 2020-07-07 DIAGNOSIS — Z20822 Contact with and (suspected) exposure to covid-19: Secondary | ICD-10-CM | POA: Diagnosis not present

## 2020-07-10 DIAGNOSIS — J019 Acute sinusitis, unspecified: Secondary | ICD-10-CM | POA: Diagnosis not present

## 2020-07-10 DIAGNOSIS — N183 Chronic kidney disease, stage 3 unspecified: Secondary | ICD-10-CM | POA: Diagnosis not present

## 2020-07-10 DIAGNOSIS — I10 Essential (primary) hypertension: Secondary | ICD-10-CM | POA: Diagnosis not present

## 2020-07-17 DIAGNOSIS — I10 Essential (primary) hypertension: Secondary | ICD-10-CM | POA: Diagnosis not present

## 2020-07-27 DIAGNOSIS — I10 Essential (primary) hypertension: Secondary | ICD-10-CM | POA: Diagnosis not present

## 2020-09-16 ENCOUNTER — Encounter (HOSPITAL_COMMUNITY): Payer: Self-pay | Admitting: Internal Medicine

## 2020-09-16 ENCOUNTER — Observation Stay (HOSPITAL_COMMUNITY): Payer: Medicare Other

## 2020-09-16 ENCOUNTER — Inpatient Hospital Stay (HOSPITAL_COMMUNITY): Payer: Medicare Other

## 2020-09-16 ENCOUNTER — Other Ambulatory Visit: Payer: Self-pay

## 2020-09-16 ENCOUNTER — Emergency Department (HOSPITAL_COMMUNITY): Payer: Medicare Other

## 2020-09-16 ENCOUNTER — Inpatient Hospital Stay (HOSPITAL_COMMUNITY)
Admission: EM | Admit: 2020-09-16 | Discharge: 2020-09-19 | DRG: 247 | Disposition: A | Discharge status: Home / Self Care | Payer: Medicare Other | Attending: Internal Medicine | Admitting: Internal Medicine

## 2020-09-16 DIAGNOSIS — R072 Precordial pain: Secondary | ICD-10-CM | POA: Diagnosis not present

## 2020-09-16 DIAGNOSIS — E669 Obesity, unspecified: Secondary | ICD-10-CM | POA: Diagnosis present

## 2020-09-16 DIAGNOSIS — R0902 Hypoxemia: Secondary | ICD-10-CM | POA: Diagnosis not present

## 2020-09-16 DIAGNOSIS — Z6834 Body mass index (BMI) 34.0-34.9, adult: Secondary | ICD-10-CM | POA: Diagnosis not present

## 2020-09-16 DIAGNOSIS — J9819 Other pulmonary collapse: Secondary | ICD-10-CM | POA: Diagnosis not present

## 2020-09-16 DIAGNOSIS — E6609 Other obesity due to excess calories: Secondary | ICD-10-CM

## 2020-09-16 DIAGNOSIS — Z853 Personal history of malignant neoplasm of breast: Secondary | ICD-10-CM

## 2020-09-16 DIAGNOSIS — R4182 Altered mental status, unspecified: Secondary | ICD-10-CM | POA: Diagnosis not present

## 2020-09-16 DIAGNOSIS — F329 Major depressive disorder, single episode, unspecified: Secondary | ICD-10-CM | POA: Diagnosis present

## 2020-09-16 DIAGNOSIS — N1831 Chronic kidney disease, stage 3a: Secondary | ICD-10-CM | POA: Diagnosis not present

## 2020-09-16 DIAGNOSIS — Z9013 Acquired absence of bilateral breasts and nipples: Secondary | ICD-10-CM

## 2020-09-16 DIAGNOSIS — Z79899 Other long term (current) drug therapy: Secondary | ICD-10-CM

## 2020-09-16 DIAGNOSIS — Z79890 Hormone replacement therapy (postmenopausal): Secondary | ICD-10-CM | POA: Diagnosis not present

## 2020-09-16 DIAGNOSIS — M810 Age-related osteoporosis without current pathological fracture: Secondary | ICD-10-CM | POA: Diagnosis present

## 2020-09-16 DIAGNOSIS — Z7982 Long term (current) use of aspirin: Secondary | ICD-10-CM

## 2020-09-16 DIAGNOSIS — R0789 Other chest pain: Secondary | ICD-10-CM | POA: Diagnosis not present

## 2020-09-16 DIAGNOSIS — E7800 Pure hypercholesterolemia, unspecified: Secondary | ICD-10-CM | POA: Diagnosis present

## 2020-09-16 DIAGNOSIS — K589 Irritable bowel syndrome without diarrhea: Secondary | ICD-10-CM | POA: Diagnosis not present

## 2020-09-16 DIAGNOSIS — Z20822 Contact with and (suspected) exposure to covid-19: Secondary | ICD-10-CM | POA: Diagnosis not present

## 2020-09-16 DIAGNOSIS — E039 Hypothyroidism, unspecified: Secondary | ICD-10-CM | POA: Diagnosis present

## 2020-09-16 DIAGNOSIS — N183 Chronic kidney disease, stage 3 unspecified: Secondary | ICD-10-CM | POA: Diagnosis present

## 2020-09-16 DIAGNOSIS — N281 Cyst of kidney, acquired: Secondary | ICD-10-CM | POA: Diagnosis not present

## 2020-09-16 DIAGNOSIS — I10 Essential (primary) hypertension: Secondary | ICD-10-CM | POA: Diagnosis not present

## 2020-09-16 DIAGNOSIS — I708 Atherosclerosis of other arteries: Secondary | ICD-10-CM | POA: Diagnosis not present

## 2020-09-16 DIAGNOSIS — I214 Non-ST elevation (NSTEMI) myocardial infarction: Principal | ICD-10-CM | POA: Diagnosis present

## 2020-09-16 DIAGNOSIS — R7303 Prediabetes: Secondary | ICD-10-CM | POA: Diagnosis not present

## 2020-09-16 DIAGNOSIS — Z6833 Body mass index (BMI) 33.0-33.9, adult: Secondary | ICD-10-CM | POA: Diagnosis not present

## 2020-09-16 DIAGNOSIS — R079 Chest pain, unspecified: Secondary | ICD-10-CM | POA: Diagnosis not present

## 2020-09-16 DIAGNOSIS — Z7984 Long term (current) use of oral hypoglycemic drugs: Secondary | ICD-10-CM | POA: Diagnosis not present

## 2020-09-16 DIAGNOSIS — E782 Mixed hyperlipidemia: Secondary | ICD-10-CM | POA: Diagnosis not present

## 2020-09-16 DIAGNOSIS — G9341 Metabolic encephalopathy: Secondary | ICD-10-CM | POA: Diagnosis not present

## 2020-09-16 DIAGNOSIS — Z91048 Other nonmedicinal substance allergy status: Secondary | ICD-10-CM

## 2020-09-16 DIAGNOSIS — I25110 Atherosclerotic heart disease of native coronary artery with unstable angina pectoris: Secondary | ICD-10-CM | POA: Diagnosis present

## 2020-09-16 DIAGNOSIS — Z803 Family history of malignant neoplasm of breast: Secondary | ICD-10-CM

## 2020-09-16 DIAGNOSIS — I129 Hypertensive chronic kidney disease with stage 1 through stage 4 chronic kidney disease, or unspecified chronic kidney disease: Secondary | ICD-10-CM | POA: Diagnosis present

## 2020-09-16 DIAGNOSIS — E785 Hyperlipidemia, unspecified: Secondary | ICD-10-CM | POA: Diagnosis not present

## 2020-09-16 DIAGNOSIS — E1122 Type 2 diabetes mellitus with diabetic chronic kidney disease: Secondary | ICD-10-CM | POA: Diagnosis present

## 2020-09-16 DIAGNOSIS — K5730 Diverticulosis of large intestine without perforation or abscess without bleeding: Secondary | ICD-10-CM | POA: Diagnosis not present

## 2020-09-16 DIAGNOSIS — Z887 Allergy status to serum and vaccine status: Secondary | ICD-10-CM

## 2020-09-16 DIAGNOSIS — R404 Transient alteration of awareness: Secondary | ICD-10-CM | POA: Diagnosis not present

## 2020-09-16 DIAGNOSIS — I2510 Atherosclerotic heart disease of native coronary artery without angina pectoris: Secondary | ICD-10-CM | POA: Diagnosis not present

## 2020-09-16 DIAGNOSIS — M4316 Spondylolisthesis, lumbar region: Secondary | ICD-10-CM | POA: Diagnosis not present

## 2020-09-16 DIAGNOSIS — N1832 Chronic kidney disease, stage 3b: Secondary | ICD-10-CM | POA: Diagnosis not present

## 2020-09-16 DIAGNOSIS — K7689 Other specified diseases of liver: Secondary | ICD-10-CM | POA: Diagnosis not present

## 2020-09-16 DIAGNOSIS — Z955 Presence of coronary angioplasty implant and graft: Secondary | ICD-10-CM

## 2020-09-16 DIAGNOSIS — I249 Acute ischemic heart disease, unspecified: Secondary | ICD-10-CM | POA: Diagnosis not present

## 2020-09-16 HISTORY — DX: Chronic kidney disease, stage 3a: N18.31

## 2020-09-16 HISTORY — DX: Hypothyroidism, unspecified: E03.9

## 2020-09-16 HISTORY — DX: Prediabetes: R73.03

## 2020-09-16 LAB — ECHOCARDIOGRAM COMPLETE
Area-P 1/2: 1.46 cm2
Height: 58 in
S' Lateral: 2.7 cm
Weight: 2560 oz

## 2020-09-16 LAB — CBC WITH DIFFERENTIAL/PLATELET
Abs Immature Granulocytes: 0.03 10*3/uL (ref 0.00–0.07)
Basophils Absolute: 0.1 10*3/uL (ref 0.0–0.1)
Basophils Relative: 1 %
Eosinophils Absolute: 0.2 10*3/uL (ref 0.0–0.5)
Eosinophils Relative: 2 %
HCT: 48.5 % — ABNORMAL HIGH (ref 36.0–46.0)
Hemoglobin: 16 g/dL — ABNORMAL HIGH (ref 12.0–15.0)
Immature Granulocytes: 0 %
Lymphocytes Relative: 18 %
Lymphs Abs: 1.6 10*3/uL (ref 0.7–4.0)
MCH: 30.3 pg (ref 26.0–34.0)
MCHC: 33 g/dL (ref 30.0–36.0)
MCV: 91.9 fL (ref 80.0–100.0)
Monocytes Absolute: 0.7 10*3/uL (ref 0.1–1.0)
Monocytes Relative: 8 %
Neutro Abs: 6.3 10*3/uL (ref 1.7–7.7)
Neutrophils Relative %: 71 %
Platelets: 329 10*3/uL (ref 150–400)
RBC: 5.28 MIL/uL — ABNORMAL HIGH (ref 3.87–5.11)
RDW: 13 % (ref 11.5–15.5)
WBC: 8.9 10*3/uL (ref 4.0–10.5)
nRBC: 0 % (ref 0.0–0.2)

## 2020-09-16 LAB — RESP PANEL BY RT-PCR (FLU A&B, COVID) ARPGX2
Influenza A by PCR: NEGATIVE
Influenza B by PCR: NEGATIVE
SARS Coronavirus 2 by RT PCR: NEGATIVE

## 2020-09-16 LAB — PROTIME-INR
INR: 1 (ref 0.8–1.2)
Prothrombin Time: 12.5 seconds (ref 11.4–15.2)

## 2020-09-16 LAB — LIPID PANEL
Cholesterol: 157 mg/dL (ref 0–200)
HDL: 45 mg/dL (ref >40)
LDL Cholesterol: 79 mg/dL (ref 0–99)
Total CHOL/HDL Ratio: 3.5 RATIO
Triglycerides: 167 mg/dL — ABNORMAL HIGH (ref <150)
VLDL: 33 mg/dL (ref 0–40)

## 2020-09-16 LAB — CBG MONITORING, ED
Glucose-Capillary: 146 mg/dL — ABNORMAL HIGH (ref 70–99)
Glucose-Capillary: 107 mg/dL — ABNORMAL HIGH (ref 70–99)

## 2020-09-16 LAB — TROPONIN I (HIGH SENSITIVITY)
Troponin I (High Sensitivity): 10 ng/L (ref <18)
Troponin I (High Sensitivity): 95 ng/L — ABNORMAL HIGH (ref <18)
Troponin I (High Sensitivity): 746 ng/L (ref <18)

## 2020-09-16 LAB — COMPREHENSIVE METABOLIC PANEL
ALT: 36 U/L (ref 0–44)
AST: 37 U/L (ref 15–41)
Albumin: 4.1 g/dL (ref 3.5–5.0)
Alkaline Phosphatase: 80 U/L (ref 38–126)
Anion gap: 11 (ref 5–15)
BUN: 31 mg/dL — ABNORMAL HIGH (ref 8–23)
CO2: 22 mmol/L (ref 22–32)
Calcium: 9.5 mg/dL (ref 8.9–10.3)
Chloride: 106 mmol/L (ref 98–111)
Creatinine, Ser: 1.3 mg/dL — ABNORMAL HIGH (ref 0.44–1.00)
GFR, Estimated: 43 mL/min — ABNORMAL LOW (ref >60)
Glucose, Bld: 156 mg/dL — ABNORMAL HIGH (ref 70–99)
Potassium: 5 mmol/L (ref 3.5–5.1)
Sodium: 139 mmol/L (ref 135–145)
Total Bilirubin: 1.3 mg/dL — ABNORMAL HIGH (ref 0.3–1.2)
Total Protein: 7.3 g/dL (ref 6.5–8.1)

## 2020-09-16 LAB — TSH: TSH: 4.189 u[IU]/mL (ref 0.350–4.500)

## 2020-09-16 LAB — APTT: aPTT: 30 seconds (ref 24–36)

## 2020-09-16 LAB — HEPARIN LEVEL (UNFRACTIONATED): Heparin Unfractionated: 0.16 IU/mL — ABNORMAL LOW (ref 0.30–0.70)

## 2020-09-16 MED ORDER — ASPIRIN EC 81 MG PO TBEC
81.0000 mg | DELAYED_RELEASE_TABLET | Freq: Every day | ORAL | Status: DC
  Administered 2020-09-17 – 2020-09-19 (×3): 81 mg via ORAL
  Filled 2020-09-16 (×3): qty 1

## 2020-09-16 MED ORDER — HYOSCYAMINE SULFATE 0.125 MG PO TABS: 0.1250 mg | ORAL_TABLET | Freq: Three times a day (TID) | ORAL | Status: DC

## 2020-09-16 MED ORDER — NITROGLYCERIN 2 % TD OINT
1.0000 [in_us] | TOPICAL_OINTMENT | Freq: Once | TRANSDERMAL | Status: AC
  Administered 2020-09-16: 1 [in_us] via TOPICAL
  Filled 2020-09-16: qty 1

## 2020-09-16 MED ORDER — ENOXAPARIN SODIUM 40 MG/0.4ML ~~LOC~~ SOLN: 40.0000 mg | SUBCUTANEOUS | Status: DC

## 2020-09-16 MED ORDER — ONDANSETRON HCL 4 MG/2ML IJ SOLN
4.0000 mg | Freq: Four times a day (QID) | INTRAMUSCULAR | Status: DC | PRN
  Administered 2020-09-16: 4 mg via INTRAVENOUS
  Filled 2020-09-16: qty 2

## 2020-09-16 MED ORDER — ASPIRIN 81 MG PO CHEW: 324.0000 mg | CHEWABLE_TABLET | Freq: Once | ORAL | Status: AC

## 2020-09-16 MED ORDER — ACETAMINOPHEN 325 MG PO TABS
650.0000 mg | ORAL_TABLET | ORAL | Status: DC | PRN
  Administered 2020-09-16 – 2020-09-17 (×3): 650 mg via ORAL
  Filled 2020-09-16 (×3): qty 2

## 2020-09-16 MED ORDER — MELATONIN 5 MG PO TABS
10.0000 mg | ORAL_TABLET | Freq: Every evening | ORAL | Status: DC | PRN
Start: 2020-09-16 — End: 2020-09-19
  Filled 2020-09-16 (×2): qty 2

## 2020-09-16 MED ORDER — HEPARIN SODIUM (PORCINE) 5000 UNIT/ML IJ SOLN: 4000.0000 [IU] | Freq: Once | INTRAMUSCULAR | Status: DC

## 2020-09-16 MED ORDER — BUPROPION HCL ER (XL) 150 MG PO TB24
300.0000 mg | ORAL_TABLET | Freq: Every day | ORAL | Status: DC
  Administered 2020-09-17 – 2020-09-19 (×3): 300 mg via ORAL
  Filled 2020-09-16: qty 2
  Filled 2020-09-16: qty 1
  Filled 2020-09-16: qty 2

## 2020-09-16 MED ORDER — INSULIN ASPART 100 UNIT/ML ~~LOC~~ SOLN: 0.0000 [IU] | Freq: Every day | SUBCUTANEOUS | Status: DC

## 2020-09-16 MED ORDER — MESALAMINE 1.2 G PO TBEC: 1.2000 g | DELAYED_RELEASE_TABLET | Freq: Two times a day (BID) | ORAL | Status: DC

## 2020-09-16 MED ORDER — LIDOCAINE VISCOUS HCL 2 % MT SOLN
15.0000 mL | Freq: Once | OROMUCOSAL | Status: AC
  Administered 2020-09-16: 15 mL via ORAL
  Filled 2020-09-16: qty 15

## 2020-09-16 MED ORDER — METOPROLOL TARTRATE 100 MG PO TABS
100.0000 mg | ORAL_TABLET | Freq: Two times a day (BID) | ORAL | Status: DC
  Administered 2020-09-16 – 2020-09-19 (×6): 100 mg via ORAL
  Filled 2020-09-16 (×3): qty 1
  Filled 2020-09-16: qty 4
  Filled 2020-09-16 (×2): qty 1

## 2020-09-16 MED ORDER — HYDROCORTISONE ACETATE 25 MG RE SUPP: 25.0000 mg | RECTAL | Status: DC | PRN

## 2020-09-16 MED ORDER — ATORVASTATIN CALCIUM 40 MG PO TABS
40.0000 mg | ORAL_TABLET | Freq: Every day | ORAL | Status: DC
  Filled 2020-09-16 (×2): qty 1

## 2020-09-16 MED ORDER — LEVOTHYROXINE SODIUM 50 MCG PO TABS
50.0000 ug | ORAL_TABLET | Freq: Every day | ORAL | Status: DC
  Administered 2020-09-17 – 2020-09-19 (×3): 50 ug via ORAL
  Filled 2020-09-16 (×3): qty 1

## 2020-09-16 MED ORDER — SODIUM CHLORIDE 0.9% FLUSH
3.0000 mL | Freq: Two times a day (BID) | INTRAVENOUS | Status: DC
  Administered 2020-09-16 – 2020-09-17 (×2): 3 mL via INTRAVENOUS

## 2020-09-16 MED ORDER — HEPARIN (PORCINE) 25000 UT/250ML-% IV SOLN
900.0000 [IU]/h | INTRAVENOUS | Status: DC
  Administered 2020-09-16: 700 [IU]/h via INTRAVENOUS
  Filled 2020-09-16: qty 250

## 2020-09-16 MED ORDER — MORPHINE SULFATE (PF) 2 MG/ML IV SOLN
2.0000 mg | INTRAVENOUS | Status: DC | PRN
Start: 2020-09-16 — End: 2020-09-19
  Administered 2020-09-16: 2 mg via INTRAVENOUS
  Filled 2020-09-16: qty 1

## 2020-09-16 MED ORDER — HYDRALAZINE HCL 20 MG/ML IJ SOLN: 5.0000 mg | INTRAMUSCULAR | Status: DC | PRN

## 2020-09-16 MED ORDER — NITROGLYCERIN IN D5W 200-5 MCG/ML-% IV SOLN
0.0000 ug/min | INTRAVENOUS | Status: DC
  Administered 2020-09-16: 5 ug/min via INTRAVENOUS
  Filled 2020-09-16: qty 250

## 2020-09-16 MED ORDER — IOHEXOL 350 MG/ML SOLN
80.0000 mL | Freq: Once | INTRAVENOUS | Status: AC | PRN
  Administered 2020-09-16: 80 mL via INTRAVENOUS

## 2020-09-16 MED ORDER — SODIUM CHLORIDE 0.9 % IV SOLN: INTRAVENOUS | Status: DC

## 2020-09-16 MED ORDER — PANTOPRAZOLE SODIUM 40 MG IV SOLR
40.0000 mg | Freq: Once | INTRAVENOUS | Status: AC
  Administered 2020-09-16: 40 mg via INTRAVENOUS
  Filled 2020-09-16: qty 40

## 2020-09-16 MED ORDER — HEPARIN BOLUS VIA INFUSION
3500.0000 [IU] | Freq: Once | INTRAVENOUS | Status: AC
  Administered 2020-09-16: 3500 [IU] via INTRAVENOUS
  Filled 2020-09-16: qty 3500

## 2020-09-16 MED ORDER — HEPARIN BOLUS VIA INFUSION
1500.0000 [IU] | Freq: Once | INTRAVENOUS | Status: AC
  Administered 2020-09-16: 1500 [IU] via INTRAVENOUS
  Filled 2020-09-16: qty 1500

## 2020-09-16 MED ORDER — ALUM & MAG HYDROXIDE-SIMETH 200-200-20 MG/5ML PO SUSP
30.0000 mL | Freq: Once | ORAL | Status: AC
  Administered 2020-09-16: 30 mL via ORAL
  Filled 2020-09-16: qty 30

## 2020-09-16 MED ORDER — INSULIN ASPART 100 UNIT/ML ~~LOC~~ SOLN
0.0000 [IU] | Freq: Three times a day (TID) | SUBCUTANEOUS | Status: DC
  Administered 2020-09-17 – 2020-09-18 (×2): 3 [IU] via SUBCUTANEOUS

## 2020-09-16 NOTE — H&P (Signed)
History and Physical    [name] GQQ:[id number] DOB: 08/17/1945 DOA: 09/16/2020  PCP: Maurice Small, MD Consultants:  Loleta Rose - nephrology Patient coming from:  Home - lives alone; NOK: Luvenia Redden [place], [phone]; [phone]  Chief Complaint: Chest pain  HPI: Jamie Benjamin is a 75 y.o. female with medical history significant of hypothyroidism; obesity; HTN; HLD; breast CA; and depression presenting with chest pain.  She developed acute onset of substernal CP that radiates into her throat and with R arm numbness.  Symptoms started about 0200.  It comes and goes, almost like spasms.  She has never had anything similar.  No heartburn.  Started with sleep.  She did notice a little tinge when she went to bed and she took Cimetidine without improvement.  It actually gets better if she gets out of bed.  Nothing seems to make it worse.  Maybe better with NTG SL but not better with NTG paste.  Normal BMs, h/o diverticular disease.  At the time of my evaluation, she was complaining of severe substernal pain, 8/10.  She was given morphine with only mild and transient improvement.  Her repeat troponin came back elevated at 98 with + delta and CTA was negative for dissection.  She was started on heparin and NTG drips with resolution of her pain.    ED Course:  Carryover, per Dr. Marlowe Sax:  75 year old with a history of hypertension, hyperlipidemia, obesity, no prior cardiac history but has never been evaluated presenting to the ED after she woke up with chest pain which radiated to her jaw. Initial troponin negative and EKG without acute changes. Currently chest pain-free after nitroglycerin paste was applied. Heart score 6.   Review of Systems: As per HPI; otherwise review of systems reviewed and negative.   Ambulatory Status:  Ambulates without assistance  COVID Vaccine Status:  Complete plus booster  Past Medical History:  Diagnosis Date  . Allergy    seasonal  .  Arthritis   . Breast cancer ([place]) 2005   left  . Depression   . Diverticulitis   . Hyperlipidemia   . Hypertension   . Hypothyroidism (acquired)   . IBS (irritable bowel syndrome)   . Obesity   . Prediabetes   . Stage 3a chronic kidney disease ([place]) 09/16/2020    Past Surgical History:  Procedure Laterality Date  . COLONOSCOPY    . MASTECTOMY, RADICAL Bilateral 10 years ago  . ROTATOR CUFF REPAIR      Social History   Socioeconomic History  . Marital status: Widowed    Spouse name: Not on file  . Number of children: Not on file  . Years of education: Not on file  . Highest education level: Not on file  Occupational History  . Occupation: Retired Therapist, sports  Tobacco Use  . Smoking status: Never Smoker  . Smokeless tobacco: Never Used  Vaping Use  . Vaping Use: Never used  Substance and Sexual Activity  . Alcohol use: No  . Drug use: No  . Sexual activity: Not on file  Other Topics Concern  . Not on file  Social History Narrative  . Not on file   Social Determinants of Health   Financial Resource Strain: Not on file  Food Insecurity: Not on file  Transportation Needs: Not on file  Physical Activity: Not on file  Stress: Not on file  Social Connections: Not on file  Intimate Partner Violence: Not on file    Allergies  Allergen Reactions  . Influenza Virus Vaccine Other (See Comments)    Family History  Problem Relation Age of Onset  . Breast cancer Sister   . Ulcerative colitis Sister   . Colon cancer Neg Hx   . Colon polyps Neg Hx   . Esophageal cancer Neg Hx   . Rectal cancer Neg Hx   . Stomach cancer Neg Hx     Prior to Admission medications   Medication Sig Start Date End Date Taking? Authorizing Provider  amLODipine (NORVASC) 10 MG tablet Take 10 mg by mouth daily. 05/26/19   [provider]  aspirin EC 81 MG tablet Take 81 mg by mouth daily.    [provider]  buPROPion (WELLBUTRIN XL) 300 MG 24 hr tablet Take 300 mg by mouth  daily.    [provider]  cholecalciferol (VITAMIN D) 1000 UNITS tablet Take 2,000 Units by mouth daily.    [provider]  hydrocortisone (ANUSOL-HC) 25 MG suppository Place 25 mg rectally as needed.  06/21/19   [provider]  hyoscyamine (LEVSIN) 0.125 MG tablet Take 1 tablet (0.125 mg total) by mouth in the morning, at noon, and at bedtime. 10/29/19   Esterwood, Amy S, PA-C  levothyroxine (SYNTHROID, LEVOTHROID) 50 MCG tablet Take 50 mcg by mouth daily before breakfast.    [provider]  Melatonin 10 MG TABS Take by mouth at bedtime as needed.    [provider]  mesalamine (LIALDA) 1.2 g EC tablet Take 1 tablet (1.2 g total) by mouth 2 (two) times daily. 10/30/19   Esterwood, Amy S, PA-C  metFORMIN (GLUCOPHAGE-XR) 500 MG 24 hr tablet Take 500 mg by mouth at bedtime. 04/19/19   [provider]  metoprolol tartrate (LOPRESSOR) 100 MG tablet Take 100 mg by mouth 2 (two) times daily.    [provider]  pravastatin (PRAVACHOL) 40 MG tablet Take 40 mg by mouth at bedtime. 08/25/14   [provider]    Physical Exam: Vitals:   09/16/20 1030 09/16/20 1045 09/16/20 1050 09/16/20 1115  BP: 102/66 110/65 (!) 121/95 113/82  Pulse: 69 69 72 67  Resp: 15 17 18    Temp:      TempSrc:      SpO2: 97% 97% 97%   Weight:      Height:         . General:  Appears calm but uncomfortable . Eyes:  PERRL, EOMI, normal lids, iris . ENT:  grossly normal hearing, lips & tongue, mmm; appropriate dentition . Neck:  no LAD, masses or thyromegaly . Cardiovascular:  RRR, no m/r/g. No LE edema.  Marland Kitchen Respiratory:   CTA bilaterally with no wheezes/rales/rhonchi.  Normal respiratory effort. . Abdomen:  soft, NT, ND, NABS . Skin:  no rash or induration seen on limited exam . Musculoskeletal:  grossly normal tone BUE/BLE, good ROM, no bony abnormality . Psychiatric:  grossly normal mood and affect, speech fluent and appropriate,  AOx3 . Neurologic:  CN 2-12 grossly intact, moves all extremities in coordinated fashion    Radiological Exams on Admission: Independently reviewed - see discussion in A/P where applicable  DG Chest Port 1 View  Result Date: 09/16/2020 CLINICAL DATA:  Chest pain EXAM: PORTABLE CHEST 1 VIEW COMPARISON:  None. FINDINGS: Borderline heart size accentuated by apical fat pad, confirmed by 2021 abdominal CT. Unremarkable mediastinal contours for technique. There is no edema, consolidation, effusion, or pneumothorax. Artifact from EKG leads. IMPRESSION: No acute finding. Electronically Signed  By: Monte Fantasia M.D.   On: 09/16/2020 05:07   CT Angio Chest/Abd/Pel for Dissection W and/or W/WO  Result Date: 09/16/2020 CLINICAL DATA:  Chest or back pain with aortic dissection suspected EXAM: CT ANGIOGRAPHY CHEST, ABDOMEN AND PELVIS TECHNIQUE: Non-contrast CT of the chest was initially obtained. Multidetector CT imaging through the chest, abdomen and pelvis was performed using the standard protocol during bolus administration of intravenous contrast. Multiplanar reconstructed images and MIPs were obtained and reviewed to evaluate the vascular anatomy. CONTRAST:  87mL OMNIPAQUE IOHEXOL 350 MG/ML SOLN COMPARISON:  None. FINDINGS: CTA CHEST FINDINGS Cardiovascular: Normal heart size. No pericardial effusion. No intramural aortic hematoma. Aortic and coronary atheromatous calcification. Negative for aortic dissection or aneurysm. Well opacified pulmonary arteries that are negative for filling defect Mediastinum/Nodes: Negative for adenopathy, mass, or hematoma. Lungs/Pleura: Areas of bronchial airway collapse/thickening. There is no edema, consolidation, effusion, or pneumothorax. Musculoskeletal: Spondylosis with multilevel thoracic ankylosis from bridging osteophyte. Remote T12 compression fracture with mild height loss. Review of the MIP images confirms the above findings. CTA ABDOMEN AND PELVIS FINDINGS  VASCULAR Aorta: Atheromatous plaque diffusely.  No aneurysm or dissection. Celiac: Patent without evidence of aneurysm, dissection, vasculitis or significant stenosis. SMA: Patent without evidence of aneurysm, dissection, vasculitis or significant stenosis. Renals: Plaque at the ostia without stenosis, beading, or dissection. Negative for aneurysm. IMA: Patent Inflow: Atheromatous plaque of the iliac arteries. No stenosis or dissection. Veins: Negative Review of the MIP images confirms the above findings. NON-VASCULAR Hepatobiliary: Low-density of the liver may be related to contrast timing. Negative for visible liver lesion.No evidence of biliary obstruction or stone. Pancreas: Unremarkable. Spleen: Unremarkable. Adrenals/Urinary Tract: Negative adrenals. No hydronephrosis or stone. Bilateral renal cysts with simple CT appearance. Unremarkable bladder. Stomach/Bowel: No obstruction. No visible bowel inflammation. Innumerable colonic diverticula. Lymphatic: No mass or adenopathy. Reproductive:No pathologic findings. Other: No ascites or pneumoperitoneum. Musculoskeletal: Lumbar spine degeneration with L4-5 anterolisthesis. No acute finding. Review of the MIP images confirms the above findings. IMPRESSION: 1. Negative for acute aortic syndrome or other specific cause of symptoms. 2.  Aortic Atherosclerosis (ICD10-I70.0).  Coronary atherosclerosis. 3. 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 4. Extensive colonic diverticulosis. Electronically Signed   By: Monte Fantasia M.D.   On: 09/16/2020 09:29    EKG: Independently reviewed.  NSR with rate 69; nonspecific ST changes with no evidence of acute ischemia   Labs on Admission: I have personally reviewed the available labs and imaging studies at the time of the admission.  Pertinent labs:   Glucose 156 BUN 31/Creatinine 1.30/GFR 43 Bili 1.3 HS troponin 10, 95 Lipids: 157/45/79/167 WBC 8.9 Hgb 16.0 INR 1.0 COVID/flu negative A1c pending TSH  4.189   Assessment/Plan Principal Problem:   ACS (acute coronary syndrome) (HCC) Active Problems:   Hyperlipidemia   Hypertension   Hypothyroidism   Major depressive disorder with current active episode   Class 1 obesity due to excess calories with body mass index (BMI) of 33.0 to 33.9 in adult   Prediabetes   Stage 3a chronic kidney disease ([place])    ACS -Patient with substernal chest pain that came on acutely during sleep and has been waxing and waning since but improved with NTG and resolved with NTG drip. -+radiation into R > L and neck -2/3 features suggestive of atypical angina.  -CXR unremarkable.   -Initial HS troponin negative; repeat with POSITIVE delta.   -EKG with nonspecific ST changes of uncertain duration, no apparent STEMI. -TIMI risk score is 5; which predicts a  14 days risk of death, recurrent MI, or urgent revascularization of 26%.  -Will admit since the patient has positive troponins and/or an abnormal EKG with angina necessitating acute intervention. -Repeat EKG in AM -Risk factor stratification with HgbA1c and FLP; will also check TSH -Cardiology consultation requested -Patient appears likely to need invasive evaluation (cardiac catheterization) based on concerning history, pertinent symptoms, elevated troponin  -Continue ASA 81 mg daily -NTG for symptom relief (although there is no mortality benefit) - pain resolved with drip, will continue -Needs beta blocker (PO, but only if not in HF or at risk for shock) - continue Lopressor -Will plan to start Heparin drip -morphine given -Supplemental O2 -Start Lipitor 40 mg qhs for now  HTN -Continue beta-blocker, as above -Hold Norvasc, Cozaar -Will also add prn hydralazine  HLD -Change Pravastatin to Lipitor -Check lipids  DM -Check A1c -Hold Glucotrol -Will cover with moderate-scale SSI for now  Stage 3a CKD -Appears to be stable at this time -Will trend BMP -Follows as outpatient with  nephrology  Hypothyroidism -Check TSH -Continue Synthroid at current dose for now  Depression -Continue Wellbutrin, Melatonin  Remote h/o breast cancer -Stable  Obesity -Body mass index is 33.44 kg/m..  -Weight loss should be encouraged -Outpatient PCP/bariatric medicine f/u encouraged    Level of care: Progressive DVT prophylaxis: Heparin drip Code Status:  Full - confirmed with patient Family Communication: None present; I spoke with her son, Dr. Malachy Moan, at the time of admission Disposition Plan:  The patient is from: home  Anticipated d/c is to: home without [facility] services once her cardiology issues have been resolved.  Anticipated d/c date will depend on clinical response to treatment and results of cardiac cath  Patient is currently: acutely ill Consults called: Cardiology  Admission status: Admit - It is my clinical opinion that admission to INPATIENT is reasonable and necessary because of the expectation that this patient will require hospital care that crosses at least 2 midnights to treat this condition based on the medical complexity of the problems presented.  Given the aforementioned information, the predictability of an adverse outcome is felt to be significant.    Karmen Bongo MD [facility]   How to contact the [facility] Attending or Consulting provider [place] or covering provider during after hours [place], for this patient?  1. Check the care team in [facility] and look for a) attending/consulting TR[facility] provider listed and b) the [facility] team listed 2. Log into [email or web address] and use [place]'s universal password to access. If you do not have the password, please contact the hospital operator. 3. Locate the [facility] provider you are looking for under [facility] and page to a number that you can be directly reached. 4. If you still have difficulty reaching the provider, please page the [facility] (Director on Call) for the Hospitalists listed on amion for  assistance.   09/16/2020, 2:19 PM

## 2020-09-16 NOTE — ED Provider Notes (Signed)
[facility] EMERGENCY DEPARTMENT Provider Note   CSN: [id number] Arrival date & time: 09/16/20  0417     History Chief Complaint  Patient presents with  . Chest Pain    Jamie Benjamin is a 75 y.o. female.  HPI  HPI: A 75 year old patient with a history of hypertension, hypercholesterolemia and obesity presents for evaluation of chest pain. Initial onset of pain was less than one hour ago. The patient's chest pain is described as heaviness/pressure/tightness, is not worse with exertion and is relieved by nitroglycerin. The patient's chest pain is middle- or left-sided, is not well-localized, is not sharp and does radiate to the arms/jaw/neck. The patient does not complain of nausea and denies diaphoresis. The patient has no history of stroke, has no history of peripheral artery disease, has not smoked in the past 90 days, denies any history of treated diabetes and has no relevant family history of coronary artery disease (first degree relative at less than age 4).   This is a 75 year old female with a history of breast cancer, hypertension, hyperlipidemia, thyroid disease who presents with chest pain.  Patient reports that she was awoken from sleep this morning around 2 AM with anterior left-sided chest pain.  She describes it as pressure and radiating into the left jaw.  She did not note that anything worsen the pain.  Denies associated shortness of breath.  She does report relief with nitroglycerin by EMS in route.  She was also given full dose aspirin.  Currently she rates her pain at 1 out of 10.  No recent fevers or cough.  No leg swelling or history of blood clots.  No known history of heart disease.  Past Medical History:  Diagnosis Date  . Allergy    seasonal  . Arthritis   . Breast cancer ([place])    left  . Depression   . Diverticulitis   . Hyperlipidemia   . Hypertension   . IBS (irritable bowel syndrome)   . Obesity   . Thyroid disease     Patient  Active Problem List   Diagnosis Date Noted  . Breast cancer ([place]) 07/02/2019  . Diverticulitis 07/02/2019  . Hyperlipidemia 07/02/2019  . Hypertension 07/02/2019  . IBS (irritable bowel syndrome) 07/02/2019  . Hypothyroidism 07/02/2019  . Major depressive disorder with current active episode 07/02/2019  . Morbid obesity ([place]) 07/02/2019  . Osteoporosis 07/02/2019  . Pernicious anemia 07/02/2019  . Prediabetes 07/02/2019  . Recurrent major depressive disorder, in partial remission ([place]) 07/02/2019  . Sciatica of left side 07/02/2019    Past Surgical History:  Procedure Laterality Date  . COLONOSCOPY    . MASTECTOMY, RADICAL Bilateral 10 years ago  . ROTATOR CUFF REPAIR       OB History   No obstetric history on file.     Family History  Problem Relation Age of Onset  . Breast cancer Sister   . Ulcerative colitis Sister   . Colon cancer Neg Hx   . Colon polyps Neg Hx   . Esophageal cancer Neg Hx   . Rectal cancer Neg Hx   . Stomach cancer Neg Hx     Social History   Tobacco Use  . Smoking status: Never Smoker  . Smokeless tobacco: Never Used  Vaping Use  . Vaping Use: Never used  Substance Use Topics  . Alcohol use: No  . Drug use: No    Home Medications Prior to Admission medications   Medication Sig Start Date  End Date Taking? Authorizing Provider  amLODipine (NORVASC) 10 MG tablet Take 10 mg by mouth daily. 05/26/19   [provider]  aspirin EC 81 MG tablet Take 81 mg by mouth daily.    [provider]  buPROPion (WELLBUTRIN XL) 300 MG 24 hr tablet Take 300 mg by mouth daily.    [provider]  cholecalciferol (VITAMIN D) 1000 UNITS tablet Take 2,000 Units by mouth daily.    [provider]  hydrocortisone (ANUSOL-HC) 25 MG suppository Place 25 mg rectally as needed.  06/21/19   [provider]  hyoscyamine (LEVSIN) 0.125 MG tablet Take 1 tablet (0.125 mg total) by mouth in the morning, at noon, and at  bedtime. 10/29/19   Esterwood, Amy S, PA-C  levothyroxine (SYNTHROID, LEVOTHROID) 50 MCG tablet Take 50 mcg by mouth daily before breakfast.    [provider]  Melatonin 10 MG TABS Take by mouth at bedtime as needed.    [provider]  mesalamine (LIALDA) 1.2 g EC tablet Take 1 tablet (1.2 g total) by mouth 2 (two) times daily. 10/30/19   Esterwood, Amy S, PA-C  metFORMIN (GLUCOPHAGE-XR) 500 MG 24 hr tablet Take 500 mg by mouth at bedtime. 04/19/19   [provider]  metoprolol tartrate (LOPRESSOR) 100 MG tablet Take 100 mg by mouth 2 (two) times daily.    [provider]  pravastatin (PRAVACHOL) 40 MG tablet Take 40 mg by mouth at bedtime. 08/25/14   [provider]    Allergies    Influenza virus vaccine  Review of Systems   Review of Systems  Constitutional: Negative for fever.  Respiratory: Negative for shortness of breath.   Cardiovascular: Positive for chest pain. Negative for leg swelling.  Gastrointestinal: Negative for abdominal pain, nausea and vomiting.  Genitourinary: Negative for dysuria.  Neurological: Negative for headaches.  All other systems reviewed and are negative.   Physical Exam Updated Vital Signs BP 116/60   Pulse (!) 59   Temp 97.9 F (36.6 C) (Oral)   Resp 17   Ht 1.473 m (4\' 10" )   Wt 72.6 kg   SpO2 95%   BMI 33.44 kg/m   Physical Exam Vitals and nursing note reviewed.  Constitutional:      Appearance: She is well-developed and well-nourished. She is not ill-appearing.  HENT:     Head: Normocephalic and atraumatic.  Eyes:     Pupils: Pupils are equal, round, and reactive to light.  Cardiovascular:     Rate and Rhythm: Normal rate and regular rhythm.     Heart sounds: Normal heart sounds.  Pulmonary:     Effort: Pulmonary effort is normal. No respiratory distress.     Breath sounds: No wheezing.  Abdominal:     General: Bowel sounds are normal.     Palpations: Abdomen is soft.  Musculoskeletal:      Cervical back: Neck supple.     Right lower leg: No tenderness. No edema.     Left lower leg: No tenderness. No edema.  Skin:    General: Skin is warm and dry.  Neurological:     Mental Status: She is alert and oriented to person, place, and time.  Psychiatric:        Mood and Affect: Mood and affect and mood normal.     ED Results / Procedures / Treatments   Labs (all labs ordered are listed, but only abnormal results are displayed) Labs Reviewed  CBC WITH DIFFERENTIAL/PLATELET - Abnormal;  Notable for the following components:      Result Value   RBC 5.28 (*)    Hemoglobin 16.0 (*)    HCT 48.5 (*)    All other components within normal limits  COMPREHENSIVE METABOLIC PANEL - Abnormal; Notable for the following components:   Glucose, Bld 156 (*)    BUN 31 (*)    Creatinine, Ser 1.30 (*)    Total Bilirubin 1.3 (*)    GFR, Estimated 43 (*)    All other components within normal limits  LIPID PANEL - Abnormal; Notable for the following components:   Triglycerides 167 (*)    All other components within normal limits  RESP PANEL BY RT-PCR (FLU A&B, COVID) ARPGX2  PROTIME-INR  APTT  HEMOGLOBIN A1C  TROPONIN I (HIGH SENSITIVITY)  TROPONIN I (HIGH SENSITIVITY)    EKG EKG Interpretation  Date/Time:  Wednesday September 16 2020 04:19:39 EST Ventricular Rate:  69 PR Interval:    QRS Duration: 87 QT Interval:  384 QTC Calculation: 412 R Axis:   18 Text Interpretation: Sinus rhythm Anteroseptal infarct, old No prior for comparison Confirmed by Thayer Jew ([id number]) on 09/16/2020 5:08:39 AM   Radiology DG Chest Port 1 View  Result Date: 09/16/2020 CLINICAL DATA:  Chest pain EXAM: PORTABLE CHEST 1 VIEW COMPARISON:  None. FINDINGS: Borderline heart size accentuated by apical fat pad, confirmed by 2021 abdominal CT. Unremarkable mediastinal contours for technique. There is no edema, consolidation, effusion, or pneumothorax. Artifact from EKG leads. IMPRESSION: No acute  finding. Electronically Signed   By: Monte Fantasia M.D.   On: 09/16/2020 05:07    Procedures Procedures   Medications Ordered in ED Medications  0.9 %  sodium chloride infusion (has no administration in time range)  aspirin chewable tablet 324 mg (324 mg Oral Given by EMS 09/16/20 0510)  nitroGLYCERIN (NITROGLYN) 2 % ointment 1 inch (1 inch Topical Given 09/16/20 0538)    ED Course  I have reviewed the triage vital signs and the nursing notes.  Pertinent labs & imaging results that were available during my care of the patient were reviewed by me and considered in my medical decision making (see chart for details).  Clinical Course as of 09/16/20 0617  Wed Sep 16, 2020  [id number] On recheck, patient with 0 out of 10 pain.  Nitropaste is in place.  She is a heart score 6.  Given risk factors and pain improved with nitroglycerin, feel it best for observation to further evaluate for ACS. [CH]    Clinical Course User Index [CH] Telma Pyeatt, Barbette Hair, MD   MDM Rules/Calculators/A&P HEAR Score: 6                        Patient presents with chest pain.  It awoke her from sleep.  She is currently experiencing 1 out of 10 pain and her pain significantly improved with nitroglycerin in route.  EKG is nonischemic at this time.  Some features of her pain are suggestive of ACS and her heart score is 6.  She certainly has risk factors and denies any prior evaluation for ischemic disease.  She denies any infectious symptoms.  Covid testing obtained.  Chest x-ray shows no evidence of pneumothorax or pneumonia.  No pulmonary edema.  She does not appear volume overloaded.  I have low suspicion for PE.  O2 sats are reassuring and pulse rate is in the 60s.  Initial troponin is 10.  Given heart score, will admit  for chest pain rule out and definitive testing.  On recheck, pain is 0 out of 10 with nitroglycerin ointment in place.  She received full dose aspirin by EMS.  Final Clinical Impression(s) / ED  Diagnoses Final diagnoses:  Precordial pain    Rx / DC Orders ED Discharge Orders    None       Merryl Hacker, MD 09/16/20 [phone]

## 2020-09-16 NOTE — Progress Notes (Signed)
[place] for Heparin Indication: chest pain/ACS  Allergies  Allergen Reactions  . Influenza Virus Vaccine Other (See Comments)    Patient Measurements: Height: 4\' 10"  (147.3 cm) Weight: 72.6 kg (160 lb) IBW/kg (Calculated) : 40.9 Heparin Dosing Weight: 57.6 kg  Vital Signs: Temp: 97.9 F (36.6 C) (02/16 1943) Temp Source: Oral (02/16 1722) BP: 100/55 (02/16 1943) Pulse Rate: 68 (02/16 1943)  Labs: Recent Labs    09/16/20 0431 09/16/20 0624 09/16/20 1916  HGB 16.0*  --   --   HCT 48.5*  --   --   PLT 329  --   --   APTT 30  --   --   LABPROT 12.5  --   --   INR 1.0  --   --   HEPARINUNFRC  --   --  0.16*  CREATININE 1.30*  --   --   TROPONINIHS 10 95*  --     Estimated Creatinine Clearance: 31.6 mL/min (A) (by C-G formula based on SCr of 1.3 mg/dL (H)).   Medical History: Past Medical History:  Diagnosis Date  . Allergy    seasonal  . Arthritis   . Breast cancer ([place]) 2005   left  . Depression   . Diverticulitis   . Hyperlipidemia   . Hypertension   . Hypothyroidism (acquired)   . IBS (irritable bowel syndrome)   . Obesity   . Prediabetes   . Stage 3a chronic kidney disease ([place]) 09/16/2020    Assessment: 28 YOF who presented on 2/16 with CP concerning for ACS. Pharmacy consulted to start Heparin for anticoagulation. CTA negative for PE.  No recent surgeries or hx CVA. Was only on a bASA PTA.  Heparin level low at 0.16. CBC WNL. No bleeding or issues with infusion per discussion with RN.  Goal of Therapy:  Heparin level 0.3-0.7 units/ml Monitor platelets by anticoagulation protocol: Yes   Plan:  Heparin 1500 unit bolus x 1 Increase heparin to 900 units/hr Check 8hr heparin level Monitor daily CBC, s/sx bleeding Cardiology planning cath 2/17   Arturo Morton, PharmD, BCPS Please check AMION for all [place] contact numbers Clinical Pharmacist 09/16/2020 8:22 PM

## 2020-09-16 NOTE — Progress Notes (Signed)
Pt. Arrived to unit accompanied by transporter. She is alert and oriented x4. Arrived to ER via EMS for c/o chest pain. Currently on nitro drip at 15/hr and heparin drip at 700/hr. She denies any pain at this time. Continent of bowel and bladder. VS are WNL. Patient is on room air. She is NPO after midnight because she will go to cath lab tomorrow. Oriented to unit. CHG bath given. Currently resting in bed in no distress.Fuller Canada, RN

## 2020-09-16 NOTE — H&P (View-Only) (Signed)
Cardiology Consultation:   Patient ID: Jamie Benjamin MRN: [id number]; DOB: 1946-01-16  Admit date: 09/16/2020 Date of Consult: 09/16/2020  PCP:  Maurice Small, MD   [place]  Cardiologist:  New to [facility] -- > Dr. Gwenlyn Found Advanced Practice Provider:  No care team member to display Electrophysiologist:  None    Patient Profile:   Jamie Benjamin is a 75 y.o. female with a hx of HTN, HLD, prediabetes, hypothyroidism, depression and history of breast cancer s/p bilateral radical mastectomy 16 years ago who is being seen today for the evaluation of chest pain at the request of Dr. Lorin Mercy.  History of Present Illness:   Jamie Benjamin is a 75 year old female with past medical history of HTN, HLD, prediabetes, hypothyroidism, depression and history of breast cancer s/p bilateral radical mastectomy 16 years ago.  She does not have any cardiac history and has never seen a cardiologist.  Echocardiogram was ordered by her PCP on 10/24/2017 which showed EF 55 to 60%, grade 1 DD, normal wall motion.  She lives by herself and is functionally independent however does not do too much strenuous activity since onset of COVID-19 pandemic.  She was able to shove some ice 3 weeks ago without any exertional symptoms. She has not noticed significant DOE recently. She was essentially in her usual state of health until prior to going to her sleep last night.  She is also on vague chest discomfort and took medication of acid reflux without improvement.  She eventually woke up around 1 AM with worsening chest discomfort.  She described the chest pain as a burning pressure sensation in the substernal area rating to to bilateral jaws, she also had bilateral arm numbness as well.  Symptom come and go and the longest episode has been around 15 to 20 minutes.  She eventually sought medical attention at [facility], ED. Cr was 1.3. CTA of chest showed no dissection, but presence of coronary atherosclerosis  noted. First troponin was 10, second troponin went up to 95. EKG shows sinus rhythm with septal infarct.    Past Medical History:  Diagnosis Date   Allergy    seasonal   Arthritis    Breast cancer ([place]) 2005   left   Depression    Diverticulitis    Hyperlipidemia    Hypertension    Hypothyroidism (acquired)    IBS (irritable bowel syndrome)    Obesity    Prediabetes     Past Surgical History:  Procedure Laterality Date   COLONOSCOPY     MASTECTOMY, RADICAL Bilateral 10 years ago   ROTATOR CUFF REPAIR       Home Medications:  Prior to Admission medications   Medication Sig Start Date End Date Taking? Authorizing Provider  amLODipine (NORVASC) 10 MG tablet Take 10 mg by mouth daily. 05/26/19  Yes [provider]  aspirin EC 81 MG tablet Take 81 mg by mouth daily.   Yes [provider]  buPROPion (WELLBUTRIN XL) 300 MG 24 hr tablet Take 300 mg by mouth daily.   Yes [provider]  cholecalciferol (VITAMIN D) 1000 UNITS tablet Take 2,000 Units by mouth daily.   Yes [provider]  glipiZIDE (GLUCOTROL XL) 2.5 MG 24 hr tablet Take 2.5 mg by mouth daily with breakfast.   Yes [provider]  levothyroxine (SYNTHROID, LEVOTHROID) 50 MCG tablet Take 50 mcg by mouth daily before breakfast.   Yes [provider]  losartan (COZAAR) 50 MG tablet  Take 50 mg by mouth daily.   Yes [provider]  Melatonin 10 MG TABS Take by mouth at bedtime as needed.   Yes [provider]  metoprolol tartrate (LOPRESSOR) 100 MG tablet Take 100 mg by mouth 2 (two) times daily.   Yes [provider]  pravastatin (PRAVACHOL) 40 MG tablet Take 40 mg by mouth at bedtime. 08/25/14  Yes [provider]  hydrocortisone (ANUSOL-HC) 25 MG suppository Place 25 mg rectally as needed.  Patient not taking: No sig reported 06/21/19   [provider]  hyoscyamine (LEVSIN) 0.125 MG tablet Take 1 tablet (0.125 mg total) by  mouth in the morning, at noon, and at bedtime. Patient not taking: No sig reported 10/29/19   Esterwood, Amy S, PA-C  mesalamine (LIALDA) 1.2 g EC tablet Take 1 tablet (1.2 g total) by mouth 2 (two) times daily. Patient not taking: No sig reported 10/30/19   Esterwood, Amy S, PA-C  metFORMIN (GLUCOPHAGE-XR) 500 MG 24 hr tablet Take 500 mg by mouth at bedtime. Patient not taking: No sig reported 04/19/19   [provider]    Inpatient Medications: Scheduled Meds:  [START ON 09/17/2020] aspirin EC  81 mg Oral Daily   atorvastatin  40 mg Oral QHS   buPROPion  300 mg Oral Daily   heparin  3,500 Units Intravenous Once   insulin aspart  0-15 Units Subcutaneous TID WC   insulin aspart  0-5 Units Subcutaneous QHS   levothyroxine  50 mcg Oral QAC breakfast   metoprolol tartrate  100 mg Oral BID   Continuous Infusions:  heparin     nitroGLYCERIN 15 mcg/min (09/16/20 1009)   PRN Meds: acetaminophen, melatonin, morphine injection, ondansetron (ZOFRAN) IV  Allergies:    Allergies  Allergen Reactions   Influenza Virus Vaccine Other (See Comments)    Social History:   Social History   Socioeconomic History   Marital status: Widowed    Spouse name: Not on file   Number of children: Not on file   Years of education: Not on file   Highest education level: Not on file  Occupational History   Occupation: Retired Therapist, sports  Tobacco Use   Smoking status: Never Smoker   Smokeless tobacco: Never Used  Scientific laboratory technician Use: Never used  Substance and Sexual Activity   Alcohol use: No   Drug use: No   Sexual activity: Not on file  Other Topics Concern   Not on file  Social History Narrative   Not on file   Social Determinants of Health   Financial Resource Strain: Not on file  Food Insecurity: Not on file  Transportation Needs: Not on file  Physical Activity: Not on file  Stress: Not on file  Social Connections: Not on file  Intimate Partner Violence: Not on file    Family  History:    Family History  Problem Relation Age of Onset   Breast cancer Sister    Ulcerative colitis Sister    Colon cancer Neg Hx    Colon polyps Neg Hx    Esophageal cancer Neg Hx    Rectal cancer Neg Hx    Stomach cancer Neg Hx      ROS:  Please see the history of present illness.   All other ROS reviewed and negative.     Physical Exam/Data:   Vitals:   09/16/20 1025 09/16/20 1030 09/16/20 1045 09/16/20 1050  BP: 108/66 102/66 110/65 (!) 121/95  Pulse: 72 69  69 72  Resp: 18 15 17 18   Temp:      TempSrc:      SpO2: 97% 97% 97% 97%  Weight:      Height:       No intake or output data in the 24 hours ending 09/16/20 1121 Last 3 Weights 09/16/2020 10/29/2019 10/01/2019  Weight (lbs) 160 lb 169 lb 6 oz 175 lb  Weight (kg) 72.576 kg 76.828 kg 79.379 kg     Body mass index is 33.44 kg/m.  General:  Well nourished, well developed, in no acute distress HEENT: normal Lymph: no adenopathy Neck: no JVD Endocrine:  No thryomegaly Vascular: No carotid bruits; FA pulses 2+ bilaterally without bruits  Cardiac:  normal S1, S2; RRR; no murmur  Lungs:  clear to auscultation bilaterally, no wheezing, rhonchi or rales  Abd: soft, nontender, no hepatomegaly  Ext: no edema Musculoskeletal:  No deformities, BUE and BLE strength normal and equal Skin: warm and dry  Neuro:  CNs 2-12 intact, no focal abnormalities noted Psych:  Normal affect   EKG:  The EKG was personally reviewed and demonstrates:  NSR with Q wave in lead V1 and V2 Telemetry:  Telemetry was personally reviewed and demonstrates:  NSR without significant ventricular ectopy  Relevant CV Studies:  Echo 10/24/2017 LV EF: 55% -   60%   -------------------------------------------------------------------  Indications:      Palpitations (R00.2).   -------------------------------------------------------------------  Study Conclusions   - Left ventricle: The cavity size was normal. Systolic function was    normal. The  estimated ejection fraction was in the range of 55%    to 60%. Wall motion was normal; there were no regional wall    motion abnormalities. Doppler parameters are consistent with    abnormal left ventricular relaxation (grade 1 diastolic    dysfunction).   Laboratory Data:  High Sensitivity Troponin:   Recent Labs  Lab 09/16/20 0431 09/16/20 0624  TROPONINIHS 10 95*     Chemistry Recent Labs  Lab 09/16/20 0431  NA 139  K 5.0  CL 106  CO2 22  GLUCOSE 156*  BUN 31*  CREATININE 1.30*  CALCIUM 9.5  GFRNONAA 43*  ANIONGAP 11    Recent Labs  Lab 09/16/20 0431  PROT 7.3  ALBUMIN 4.1  AST 37  ALT 36  ALKPHOS 80  BILITOT 1.3*   Hematology Recent Labs  Lab 09/16/20 0431  WBC 8.9  RBC 5.28*  HGB 16.0*  HCT 48.5*  MCV 91.9  MCH 30.3  MCHC 33.0  RDW 13.0  PLT 329   BNPNo results for input(s): BNP, PROBNP in the last 168 hours.  DDimer No results for input(s): DDIMER in the last 168 hours.   Radiology/Studies:  DG Chest Port 1 View  Result Date: 09/16/2020 CLINICAL DATA:  Chest pain EXAM: PORTABLE CHEST 1 VIEW COMPARISON:  None. FINDINGS: Borderline heart size accentuated by apical fat pad, confirmed by 2021 abdominal CT. Unremarkable mediastinal contours for technique. There is no edema, consolidation, effusion, or pneumothorax. Artifact from EKG leads. IMPRESSION: No acute finding. Electronically Signed   By: Monte Fantasia M.D.   On: 09/16/2020 05:07   CT Angio Chest/Abd/Pel for Dissection W and/or W/WO  Result Date: 09/16/2020 CLINICAL DATA:  Chest or back pain with aortic dissection suspected EXAM: CT ANGIOGRAPHY CHEST, ABDOMEN AND PELVIS TECHNIQUE: Non-contrast CT of the chest was initially obtained. Multidetector CT imaging through the chest, abdomen and pelvis was performed using the standard protocol during bolus administration of  intravenous contrast. Multiplanar reconstructed images and MIPs were obtained and reviewed to evaluate the vascular anatomy.  CONTRAST:  28mL OMNIPAQUE IOHEXOL 350 MG/ML SOLN COMPARISON:  None. FINDINGS: CTA CHEST FINDINGS Cardiovascular: Normal heart size. No pericardial effusion. No intramural aortic hematoma. Aortic and coronary atheromatous calcification. Negative for aortic dissection or aneurysm. Well opacified pulmonary arteries that are negative for filling defect Mediastinum/Nodes: Negative for adenopathy, mass, or hematoma. Lungs/Pleura: Areas of bronchial airway collapse/thickening. There is no edema, consolidation, effusion, or pneumothorax. Musculoskeletal: Spondylosis with multilevel thoracic ankylosis from bridging osteophyte. Remote T12 compression fracture with mild height loss. Review of the MIP images confirms the above findings. CTA ABDOMEN AND PELVIS FINDINGS VASCULAR Aorta: Atheromatous plaque diffusely.  No aneurysm or dissection. Celiac: Patent without evidence of aneurysm, dissection, vasculitis or significant stenosis. SMA: Patent without evidence of aneurysm, dissection, vasculitis or significant stenosis. Renals: Plaque at the ostia without stenosis, beading, or dissection. Negative for aneurysm. IMA: Patent Inflow: Atheromatous plaque of the iliac arteries. No stenosis or dissection. Veins: Negative Review of the MIP images confirms the above findings. NON-VASCULAR Hepatobiliary: Low-density of the liver may be related to contrast timing. Negative for visible liver lesion.No evidence of biliary obstruction or stone. Pancreas: Unremarkable. Spleen: Unremarkable. Adrenals/Urinary Tract: Negative adrenals. No hydronephrosis or stone. Bilateral renal cysts with simple CT appearance. Unremarkable bladder. Stomach/Bowel: No obstruction. No visible bowel inflammation. Innumerable colonic diverticula. Lymphatic: No mass or adenopathy. Reproductive:No pathologic findings. Other: No ascites or pneumoperitoneum. Musculoskeletal: Lumbar spine degeneration with L4-5 anterolisthesis. No acute finding. Review of the MIP  images confirms the above findings. IMPRESSION: 1. Negative for acute aortic syndrome or other specific cause of symptoms. 2.  Aortic Atherosclerosis (ICD10-I70.0).  Coronary atherosclerosis. 3. 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 4. Extensive colonic diverticulosis. Electronically Signed   By: Monte Fantasia M.D.   On: 09/16/2020 09:29     Assessment and Plan:   NSTEMI  - vague burning pressure in the chest before she went to asleep last night, developed significant discomfort around 1AM. Chest pain on and off until around 10AM this morning. Currently 0/10 chest pain  - CTA of chest shows no dissection, but with coronary atheterosclerosis  - started on IV heparin.   - will obtain echocardiogram and discuss with MD regarding cath tomorrow after overnight hydration. Patient is concerned of her kidney function  - Risk and benefit of procedure explained to the patient who display clear understanding and agree to proceed. Discussed with patient possible procedural risk include bleeding, vascular injury, renal injury, arrythmia, MI, stroke and loss of limb or life.  HTN: on amlodipine and metoprolol tartrate at home, continue on current therapy  HLD: on pravachol, switch to lipitor   Prediabetes: hold metformin  Hypothyroidism: on levothyroxine  H/o breast CA s/p bilateral radical mastectomy 75 years old   Risk Assessment/Risk Scores:     TIMI Risk Score for Unstable Angina or Non-ST Elevation MI:   The patient's TIMI risk score is 4, which indicates a 20% risk of all cause mortality, new or recurrent myocardial infarction or need for urgent revascularization in the next 14 days.          For questions or updates, please contact [place] Please consult [email or web address] for contact info under    Signed, Almyra Deforest, [place]  09/16/2020 11:21 AM  Agree with note by Almyra Deforest PA-C  Jamie Benjamin  was admitted with chest pain last night and again this morning she is currently  pain-free on IV nitroglycerin.  Heparin is about to be hung.  She does have positive risk factors including prediabetes, treated hypertension hyperlipidemia.  Her EKG shows no acute changes.  Enzymes were positive with a troponin of 95.  Chest CTA showed no evidence for dissection or pulmonary embolus but there was coronary atherosclerosis noted.  Her exam is benign.  Plans are for diagnostic coronary angiography tomorrow second case by Dr. Martinique.  Lorretta Harp, M.D., [place], [facility], [place] [address] [place], [place]  [id number]  [phone] 09/16/2020 12:42 PM

## 2020-09-16 NOTE — Consult Note (Addendum)
Cardiology Consultation:   Patient ID: Jamie Benjamin MRN: [id number]; DOB: 1946/02/24  Admit date: 09/16/2020 Date of Consult: 09/16/2020  PCP:  Maurice Small, MD   [place]  Cardiologist:  New to [facility] -- > Dr. Gwenlyn Found Advanced Practice Provider:  No care team member to display Electrophysiologist:  None    Patient Profile:   Jamie Benjamin is a 75 y.o. female with a hx of HTN, HLD, prediabetes, hypothyroidism, depression and history of breast cancer s/p bilateral radical mastectomy 16 years ago who is being seen today for the evaluation of chest pain at the request of Dr. Lorin Mercy.  History of Present Illness:   [name] is a 75 year old female with past medical history of HTN, HLD, prediabetes, hypothyroidism, depression and history of breast cancer s/p bilateral radical mastectomy 16 years ago.  She does not have any cardiac history and has never seen a cardiologist.  Echocardiogram was ordered by her PCP on 10/24/2017 which showed EF 55 to 60%, grade 1 DD, normal wall motion.  She lives by herself and is functionally independent however does not do too much strenuous activity since onset of COVID-19 pandemic.  She was able to shove some ice 3 weeks ago without any exertional symptoms. She has not noticed significant DOE recently. She was essentially in her usual state of health until prior to going to her sleep last night.  She is also on vague chest discomfort and took medication of acid reflux without improvement.  She eventually woke up around 1 AM with worsening chest discomfort.  She described the chest pain as a burning pressure sensation in the substernal area rating to to bilateral jaws, she also had bilateral arm numbness as well.  Symptom come and go and the longest episode has been around 15 to 20 minutes.  She eventually sought medical attention at [facility], ED. Cr was 1.3. CTA of chest showed no dissection, but presence of coronary atherosclerosis  noted. First troponin was 10, second troponin went up to 95. EKG shows sinus rhythm with septal infarct.    Past Medical History:  Diagnosis Date   Allergy    seasonal   Arthritis    Breast cancer ([place]) 2005   left   Depression    Diverticulitis    Hyperlipidemia    Hypertension    Hypothyroidism (acquired)    IBS (irritable bowel syndrome)    Obesity    Prediabetes     Past Surgical History:  Procedure Laterality Date   COLONOSCOPY     MASTECTOMY, RADICAL Bilateral 10 years ago   ROTATOR CUFF REPAIR       Home Medications:  Prior to Admission medications   Medication Sig Start Date End Date Taking? Authorizing Provider  amLODipine (NORVASC) 10 MG tablet Take 10 mg by mouth daily. 05/26/19  Yes [provider]  aspirin EC 81 MG tablet Take 81 mg by mouth daily.   Yes [provider]  buPROPion (WELLBUTRIN XL) 300 MG 24 hr tablet Take 300 mg by mouth daily.   Yes [provider]  cholecalciferol (VITAMIN D) 1000 UNITS tablet Take 2,000 Units by mouth daily.   Yes [provider]  glipiZIDE (GLUCOTROL XL) 2.5 MG 24 hr tablet Take 2.5 mg by mouth daily with breakfast.   Yes [provider]  levothyroxine (SYNTHROID, LEVOTHROID) 50 MCG tablet Take 50 mcg by mouth daily before breakfast.   Yes [provider]  losartan (COZAAR) 50 MG tablet  Take 50 mg by mouth daily.   Yes [provider]  Melatonin 10 MG TABS Take by mouth at bedtime as needed.   Yes [provider]  metoprolol tartrate (LOPRESSOR) 100 MG tablet Take 100 mg by mouth 2 (two) times daily.   Yes [provider]  pravastatin (PRAVACHOL) 40 MG tablet Take 40 mg by mouth at bedtime. 08/25/14  Yes [provider]  hydrocortisone (ANUSOL-HC) 25 MG suppository Place 25 mg rectally as needed.  Patient not taking: No sig reported 06/21/19   [provider]  hyoscyamine (LEVSIN) 0.125 MG tablet Take 1 tablet (0.125 mg total) by  mouth in the morning, at noon, and at bedtime. Patient not taking: No sig reported 10/29/19   Esterwood, Amy S, PA-C  mesalamine (LIALDA) 1.2 g EC tablet Take 1 tablet (1.2 g total) by mouth 2 (two) times daily. Patient not taking: No sig reported 10/30/19   Esterwood, Amy S, PA-C  metFORMIN (GLUCOPHAGE-XR) 500 MG 24 hr tablet Take 500 mg by mouth at bedtime. Patient not taking: No sig reported 04/19/19   [provider]    Inpatient Medications: Scheduled Meds:  [START ON 09/17/2020] aspirin EC  81 mg Oral Daily   atorvastatin  40 mg Oral QHS   buPROPion  300 mg Oral Daily   heparin  3,500 Units Intravenous Once   insulin aspart  0-15 Units Subcutaneous TID WC   insulin aspart  0-5 Units Subcutaneous QHS   levothyroxine  50 mcg Oral QAC breakfast   metoprolol tartrate  100 mg Oral BID   Continuous Infusions:  heparin     nitroGLYCERIN 15 mcg/min (09/16/20 1009)   PRN Meds: acetaminophen, melatonin, morphine injection, ondansetron (ZOFRAN) IV  Allergies:    Allergies  Allergen Reactions   Influenza Virus Vaccine Other (See Comments)    Social History:   Social History   Socioeconomic History   Marital status: Widowed    Spouse name: Not on file   Number of children: Not on file   Years of education: Not on file   Highest education level: Not on file  Occupational History   Occupation: Retired Therapist, sports  Tobacco Use   Smoking status: Never Smoker   Smokeless tobacco: Never Used  Scientific laboratory technician Use: Never used  Substance and Sexual Activity   Alcohol use: No   Drug use: No   Sexual activity: Not on file  Other Topics Concern   Not on file  Social History Narrative   Not on file   Social Determinants of Health   Financial Resource Strain: Not on file  Food Insecurity: Not on file  Transportation Needs: Not on file  Physical Activity: Not on file  Stress: Not on file  Social Connections: Not on file  Intimate Partner Violence: Not on file    Family  History:    Family History  Problem Relation Age of Onset   Breast cancer Sister    Ulcerative colitis Sister    Colon cancer Neg Hx    Colon polyps Neg Hx    Esophageal cancer Neg Hx    Rectal cancer Neg Hx    Stomach cancer Neg Hx      ROS:  Please see the history of present illness.   All other ROS reviewed and negative.     Physical Exam/Data:   Vitals:   09/16/20 1025 09/16/20 1030 09/16/20 1045 09/16/20 1050  BP: 108/66 102/66 110/65 (!) 121/95  Pulse: 72 69  69 72  Resp: 18 15 17 18   Temp:      TempSrc:      SpO2: 97% 97% 97% 97%  Weight:      Height:       No intake or output data in the 24 hours ending 09/16/20 1121 Last 3 Weights 09/16/2020 10/29/2019 10/01/2019  Weight (lbs) 160 lb 169 lb 6 oz 175 lb  Weight (kg) 72.576 kg 76.828 kg 79.379 kg     Body mass index is 33.44 kg/m.  General:  Well nourished, well developed, in no acute distress HEENT: normal Lymph: no adenopathy Neck: no JVD Endocrine:  No thryomegaly Vascular: No carotid bruits; FA pulses 2+ bilaterally without bruits  Cardiac:  normal S1, S2; RRR; no murmur  Lungs:  clear to auscultation bilaterally, no wheezing, rhonchi or rales  Abd: soft, nontender, no hepatomegaly  Ext: no edema Musculoskeletal:  No deformities, BUE and BLE strength normal and equal Skin: warm and dry  Neuro:  CNs 2-12 intact, no focal abnormalities noted Psych:  Normal affect   EKG:  The EKG was personally reviewed and demonstrates:  NSR with Q wave in lead V1 and V2 Telemetry:  Telemetry was personally reviewed and demonstrates:  NSR without significant ventricular ectopy  Relevant CV Studies:  Echo 10/24/2017 LV EF: 55% -   60%   -------------------------------------------------------------------  Indications:      Palpitations (R00.2).   -------------------------------------------------------------------  Study Conclusions   - Left ventricle: The cavity size was normal. Systolic function was    normal. The  estimated ejection fraction was in the range of 55%    to 60%. Wall motion was normal; there were no regional wall    motion abnormalities. Doppler parameters are consistent with    abnormal left ventricular relaxation (grade 1 diastolic    dysfunction).   Laboratory Data:  High Sensitivity Troponin:   Recent Labs  Lab 09/16/20 0431 09/16/20 0624  TROPONINIHS 10 95*     Chemistry Recent Labs  Lab 09/16/20 0431  NA 139  K 5.0  CL 106  CO2 22  GLUCOSE 156*  BUN 31*  CREATININE 1.30*  CALCIUM 9.5  GFRNONAA 43*  ANIONGAP 11    Recent Labs  Lab 09/16/20 0431  PROT 7.3  ALBUMIN 4.1  AST 37  ALT 36  ALKPHOS 80  BILITOT 1.3*   Hematology Recent Labs  Lab 09/16/20 0431  WBC 8.9  RBC 5.28*  HGB 16.0*  HCT 48.5*  MCV 91.9  MCH 30.3  MCHC 33.0  RDW 13.0  PLT 329   BNPNo results for input(s): BNP, PROBNP in the last 168 hours.  DDimer No results for input(s): DDIMER in the last 168 hours.   Radiology/Studies:  DG Chest Port 1 View  Result Date: 09/16/2020 CLINICAL DATA:  Chest pain EXAM: PORTABLE CHEST 1 VIEW COMPARISON:  None. FINDINGS: Borderline heart size accentuated by apical fat pad, confirmed by 2021 abdominal CT. Unremarkable mediastinal contours for technique. There is no edema, consolidation, effusion, or pneumothorax. Artifact from EKG leads. IMPRESSION: No acute finding. Electronically Signed   By: Monte Fantasia M.D.   On: 09/16/2020 05:07   CT Angio Chest/Abd/Pel for Dissection W and/or W/WO  Result Date: 09/16/2020 CLINICAL DATA:  Chest or back pain with aortic dissection suspected EXAM: CT ANGIOGRAPHY CHEST, ABDOMEN AND PELVIS TECHNIQUE: Non-contrast CT of the chest was initially obtained. Multidetector CT imaging through the chest, abdomen and pelvis was performed using the standard protocol during bolus administration of  intravenous contrast. Multiplanar reconstructed images and MIPs were obtained and reviewed to evaluate the vascular anatomy.  CONTRAST:  80mL OMNIPAQUE IOHEXOL 350 MG/ML SOLN COMPARISON:  None. FINDINGS: CTA CHEST FINDINGS Cardiovascular: Normal heart size. No pericardial effusion. No intramural aortic hematoma. Aortic and coronary atheromatous calcification. Negative for aortic dissection or aneurysm. Well opacified pulmonary arteries that are negative for filling defect Mediastinum/Nodes: Negative for adenopathy, mass, or hematoma. Lungs/Pleura: Areas of bronchial airway collapse/thickening. There is no edema, consolidation, effusion, or pneumothorax. Musculoskeletal: Spondylosis with multilevel thoracic ankylosis from bridging osteophyte. Remote T12 compression fracture with mild height loss. Review of the MIP images confirms the above findings. CTA ABDOMEN AND PELVIS FINDINGS VASCULAR Aorta: Atheromatous plaque diffusely.  No aneurysm or dissection. Celiac: Patent without evidence of aneurysm, dissection, vasculitis or significant stenosis. SMA: Patent without evidence of aneurysm, dissection, vasculitis or significant stenosis. Renals: Plaque at the ostia without stenosis, beading, or dissection. Negative for aneurysm. IMA: Patent Inflow: Atheromatous plaque of the iliac arteries. No stenosis or dissection. Veins: Negative Review of the MIP images confirms the above findings. NON-VASCULAR Hepatobiliary: Low-density of the liver may be related to contrast timing. Negative for visible liver lesion.No evidence of biliary obstruction or stone. Pancreas: Unremarkable. Spleen: Unremarkable. Adrenals/Urinary Tract: Negative adrenals. No hydronephrosis or stone. Bilateral renal cysts with simple CT appearance. Unremarkable bladder. Stomach/Bowel: No obstruction. No visible bowel inflammation. Innumerable colonic diverticula. Lymphatic: No mass or adenopathy. Reproductive:No pathologic findings. Other: No ascites or pneumoperitoneum. Musculoskeletal: Lumbar spine degeneration with L4-5 anterolisthesis. No acute finding. Review of the MIP  images confirms the above findings. IMPRESSION: 1. Negative for acute aortic syndrome or other specific cause of symptoms. 2.  Aortic Atherosclerosis (ICD10-I70.0).  Coronary atherosclerosis. 3. 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 4. Extensive colonic diverticulosis. Electronically Signed   By: Jonathon  Watts M.D.   On: 09/16/2020 09:29     Assessment and Plan:   NSTEMI  - vague burning pressure in the chest before she went to asleep last night, developed significant discomfort around 1AM. Chest pain on and off until around 10AM this morning. Currently 0/10 chest pain  - CTA of chest shows no dissection, but with coronary atheterosclerosis  - started on IV heparin.   - will obtain echocardiogram and discuss with MD regarding cath tomorrow after overnight hydration. Patient is concerned of her kidney function  - Risk and benefit of procedure explained to the patient who display clear understanding and agree to proceed. Discussed with patient possible procedural risk include bleeding, vascular injury, renal injury, arrythmia, MI, stroke and loss of limb or life.  HTN: on amlodipine and metoprolol tartrate at home, continue on current therapy  HLD: on pravachol, switch to lipitor   Prediabetes: hold metformin  Hypothyroidism: on levothyroxine  H/o breast CA s/p bilateral radical mastectomy 75 years old   Risk Assessment/Risk Scores:     TIMI Risk Score for Unstable Angina or Non-ST Elevation MI:   The patient's TIMI risk score is 4, which indicates a 20% risk of all cause mortality, new or recurrent myocardial infarction or need for urgent revascularization in the next 14 days.          For questions or updates, please contact [facility] Please consult [email or web address] for contact info under    Signed, Hao Meng, PA  09/16/2020 11:21 AM  Agree with note by Hao Meng PA-C  Ms. Akhavan  was admitted with chest pain last night and again this morning she is currently  pain-free on IV nitroglycerin.    Heparin is about to be hung.  She does have positive risk factors including prediabetes, treated hypertension hyperlipidemia.  Her EKG shows no acute changes.  Enzymes were positive with a troponin of 95.  Chest CTA showed no evidence for dissection or pulmonary embolus but there was coronary atherosclerosis noted.  Her exam is benign.  Plans are for diagnostic coronary angiography tomorrow second case by Dr. [place].  Lorretta Harp, M.D., [place], [facility], [place] [address] [place], [place]  [address]  [phone] 09/16/2020 12:42 PM

## 2020-09-16 NOTE — Progress Notes (Signed)
ANTICOAGULATION CONSULT NOTE - Initial Consult  Pharmacy Consult for Heparin Indication: chest pain/ACS  Allergies  Allergen Reactions  . Influenza Virus Vaccine Other (See Comments)    Patient Measurements: Height: 4\' 10"  (147.3 cm) Weight: 72.6 kg (160 lb) IBW/kg (Calculated) : 40.9 Heparin Dosing Weight: 57.6 kg  Vital Signs: Temp: 97.9 F (36.6 C) (02/16 0422) Temp Source: Oral (02/16 0422) BP: 125/64 (02/16 1009) Pulse Rate: 71 (02/16 1009)  Labs: Recent Labs    09/16/20 0431 09/16/20 0624  HGB 16.0*  --   HCT 48.5*  --   PLT 329  --   APTT 30  --   LABPROT 12.5  --   INR 1.0  --   CREATININE 1.30*  --   TROPONINIHS 10 95*    Estimated Creatinine Clearance: 31.6 mL/min (A) (by C-G formula based on SCr of 1.3 mg/dL (H)).   Medical History: Past Medical History:  Diagnosis Date  . Allergy    seasonal  . Arthritis   . Breast cancer ([place]) 2005   left  . Depression   . Diverticulitis   . Hyperlipidemia   . Hypertension   . Hypothyroidism (acquired)   . IBS (irritable bowel syndrome)   . Obesity   . Prediabetes     Assessment: 66 YOF who presented on 2/16 with CP concerning for ACS. Pharmacy consulted to start Heparin for anticoagulation.   No recent surgeries or hx CVA. Was only on a bASA PTA. Initial CBC okay.   Goal of Therapy:  Heparin level 0.3-0.7 units/ml Monitor platelets by anticoagulation protocol: Yes   Plan:  - Heparin 3500 unit bolus x 1 - Start Heparin at a rate of 700 units/hr - Will continue to monitor for any signs/symptoms of bleeding and will follow up with heparin level in 8 hours   Thank you for allowing pharmacy to be a part of this patient's care.  Alycia Rossetti, PharmD, BCPS Clinical Pharmacist Clinical phone for 09/16/2020: [phone] 09/16/2020 10:14 AM   **Pharmacist phone directory can now be found on [email or web address] (PW [email or web address]).  Listed under [place].

## 2020-09-16 NOTE — ED Notes (Signed)
Pt c/o spasms in esophagus and chest area that shoots to left jaw and right arm. Heart rate 68, NSR. VSS. No nausea, no diaphoresis.  Notified Dr. Lorin Mercy who is now at bedside.

## 2020-09-16 NOTE — Progress Notes (Signed)
Echocardiogram 2D Echocardiogram has been performed.  Jamie Benjamin 09/16/2020, 1:52 PM

## 2020-09-16 NOTE — ED Triage Notes (Signed)
Patient BIB [facility] from home. Patient stated she started having Crusting Sternal Chest Pain that radiated to her left jaw around 2am. Patient had 8/10 pain with EMS. EMS gave 0.4 Nitro and 324 ASA.  After Nitro patient SBP 115. Pain went to 0/10. Patient denies any cardiac history. Upon arrival patient stated her pain was returning 1/10.

## 2020-09-17 ENCOUNTER — Encounter (HOSPITAL_COMMUNITY)
Admission: EM | Disposition: A | Discharge status: Home / Self Care | Payer: Self-pay | Source: Home / Self Care | Attending: Internal Medicine

## 2020-09-17 DIAGNOSIS — I2510 Atherosclerotic heart disease of native coronary artery without angina pectoris: Secondary | ICD-10-CM

## 2020-09-17 DIAGNOSIS — I249 Acute ischemic heart disease, unspecified: Secondary | ICD-10-CM | POA: Diagnosis not present

## 2020-09-17 DIAGNOSIS — I214 Non-ST elevation (NSTEMI) myocardial infarction: Secondary | ICD-10-CM | POA: Diagnosis not present

## 2020-09-17 HISTORY — PX: INTRAVASCULAR IMAGING/OCT: CATH118326

## 2020-09-17 HISTORY — PX: CORONARY STENT INTERVENTION: CATH118234

## 2020-09-17 HISTORY — PX: LEFT HEART CATH AND CORONARY ANGIOGRAPHY: CATH118249

## 2020-09-17 LAB — CBC
HCT: 39.4 % (ref 36.0–46.0)
Hemoglobin: 13.8 g/dL (ref 12.0–15.0)
MCH: 31.6 pg (ref 26.0–34.0)
MCHC: 35 g/dL (ref 30.0–36.0)
MCV: 90.2 fL (ref 80.0–100.0)
Platelets: 273 10*3/uL (ref 150–400)
RBC: 4.37 MIL/uL (ref 3.87–5.11)
RDW: 13.3 % (ref 11.5–15.5)
WBC: 8 10*3/uL (ref 4.0–10.5)
nRBC: 0 % (ref 0.0–0.2)

## 2020-09-17 LAB — HEMOGLOBIN A1C
Hgb A1c MFr Bld: 6.3 % — ABNORMAL HIGH (ref 4.8–5.6)
Mean Plasma Glucose: 134 mg/dL

## 2020-09-17 LAB — GLUCOSE, CAPILLARY
Glucose-Capillary: 73 mg/dL (ref 70–99)
Glucose-Capillary: 105 mg/dL — ABNORMAL HIGH (ref 70–99)
Glucose-Capillary: 108 mg/dL — ABNORMAL HIGH (ref 70–99)
Glucose-Capillary: 164 mg/dL — ABNORMAL HIGH (ref 70–99)
Glucose-Capillary: 108 mg/dL — ABNORMAL HIGH (ref 70–99)

## 2020-09-17 LAB — POCT ACTIVATED CLOTTING TIME
Activated Clotting Time: 440 seconds
Activated Clotting Time: 357 seconds

## 2020-09-17 LAB — HEPARIN LEVEL (UNFRACTIONATED): Heparin Unfractionated: 0.53 IU/mL (ref 0.30–0.70)

## 2020-09-17 SURGERY — LEFT HEART CATH AND CORONARY ANGIOGRAPHY
Anesthesia: LOCAL

## 2020-09-17 MED ORDER — LIDOCAINE HCL (PF) 1 % IJ SOLN
INTRAMUSCULAR | Status: DC | PRN
  Administered 2020-09-17: 5 mL

## 2020-09-17 MED ORDER — HEPARIN SODIUM (PORCINE) 1000 UNIT/ML IJ SOLN
INTRAMUSCULAR | Status: DC | PRN
  Administered 2020-09-17 (×2): 4000 [IU] via INTRAVENOUS

## 2020-09-17 MED ORDER — MIDAZOLAM HCL 2 MG/2ML IJ SOLN
INTRAMUSCULAR | Status: AC
  Filled 2020-09-17: qty 2

## 2020-09-17 MED ORDER — TICAGRELOR 90 MG PO TABS
ORAL_TABLET | ORAL | Status: DC | PRN
  Administered 2020-09-17: 180 mg via ORAL

## 2020-09-17 MED ORDER — AMLODIPINE BESYLATE 10 MG PO TABS
10.0000 mg | ORAL_TABLET | Freq: Every day | ORAL | Status: DC
  Administered 2020-09-17 – 2020-09-19 (×3): 10 mg via ORAL
  Filled 2020-09-17 (×4): qty 1

## 2020-09-17 MED ORDER — MIDAZOLAM HCL 2 MG/2ML IJ SOLN
INTRAMUSCULAR | Status: DC | PRN
  Administered 2020-09-17: 1 mg via INTRAVENOUS

## 2020-09-17 MED ORDER — SODIUM CHLORIDE 0.9 % IV SOLN: 250.0000 mL | INTRAVENOUS | Status: DC | PRN

## 2020-09-17 MED ORDER — VERAPAMIL HCL 2.5 MG/ML IV SOLN
INTRAVENOUS | Status: AC
  Filled 2020-09-17: qty 2

## 2020-09-17 MED ORDER — ASPIRIN 81 MG PO CHEW: 81.0000 mg | CHEWABLE_TABLET | ORAL | Status: AC

## 2020-09-17 MED ORDER — HEPARIN SODIUM (PORCINE) 1000 UNIT/ML IJ SOLN
INTRAMUSCULAR | Status: AC
  Filled 2020-09-17: qty 1

## 2020-09-17 MED ORDER — NITROGLYCERIN 1 MG/10 ML FOR IR/CATH LAB
INTRA_ARTERIAL | Status: AC
  Filled 2020-09-17: qty 10

## 2020-09-17 MED ORDER — IOHEXOL 350 MG/ML SOLN
INTRAVENOUS | Status: DC | PRN
  Administered 2020-09-17: 120 mL

## 2020-09-17 MED ORDER — TICAGRELOR 90 MG PO TABS
ORAL_TABLET | ORAL | Status: AC
  Filled 2020-09-17: qty 2

## 2020-09-17 MED ORDER — FENTANYL CITRATE (PF) 100 MCG/2ML IJ SOLN
INTRAMUSCULAR | Status: AC
  Filled 2020-09-17: qty 2

## 2020-09-17 MED ORDER — LIDOCAINE HCL (PF) 1 % IJ SOLN
INTRAMUSCULAR | Status: AC
  Filled 2020-09-17: qty 30

## 2020-09-17 MED ORDER — LOSARTAN POTASSIUM 50 MG PO TABS
50.0000 mg | ORAL_TABLET | Freq: Every day | ORAL | Status: DC
  Administered 2020-09-17: 50 mg via ORAL
  Filled 2020-09-17 (×2): qty 1

## 2020-09-17 MED ORDER — HEPARIN (PORCINE) IN NACL 1000-0.9 UT/500ML-% IV SOLN
INTRAVENOUS | Status: AC
  Filled 2020-09-17: qty 1000

## 2020-09-17 MED ORDER — SODIUM CHLORIDE 0.9% FLUSH: 3.0000 mL | INTRAVENOUS | Status: DC | PRN

## 2020-09-17 MED ORDER — ENOXAPARIN SODIUM 40 MG/0.4ML ~~LOC~~ SOLN
40.0000 mg | SUBCUTANEOUS | Status: DC
  Filled 2020-09-17: qty 0.4

## 2020-09-17 MED ORDER — NITROGLYCERIN 1 MG/10 ML FOR IR/CATH LAB
INTRA_ARTERIAL | Status: DC | PRN
  Administered 2020-09-17 (×3): 200 ug via INTRACORONARY

## 2020-09-17 MED ORDER — FENTANYL CITRATE (PF) 100 MCG/2ML IJ SOLN
INTRAMUSCULAR | Status: DC | PRN
  Administered 2020-09-17: 25 ug via INTRAVENOUS

## 2020-09-17 MED ORDER — SODIUM CHLORIDE 0.9% FLUSH
3.0000 mL | Freq: Two times a day (BID) | INTRAVENOUS | Status: DC
  Administered 2020-09-17 – 2020-09-18 (×2): 3 mL via INTRAVENOUS

## 2020-09-17 MED ORDER — POLYETHYLENE GLYCOL 3350 17 G PO PACK
17.0000 g | PACK | Freq: Every day | ORAL | Status: DC
  Administered 2020-09-17 – 2020-09-18 (×2): 17 g via ORAL
  Filled 2020-09-17 (×2): qty 1

## 2020-09-17 MED ORDER — HEPARIN (PORCINE) IN NACL 1000-0.9 UT/500ML-% IV SOLN
INTRAVENOUS | Status: DC | PRN
  Administered 2020-09-17 (×2): 500 mL

## 2020-09-17 MED ORDER — SODIUM CHLORIDE 0.9 % WEIGHT BASED INFUSION
1.0000 mL/kg/h | INTRAVENOUS | Status: DC
  Administered 2020-09-17: 1 mL/kg/h via INTRAVENOUS

## 2020-09-17 MED ORDER — TICAGRELOR 90 MG PO TABS
90.0000 mg | ORAL_TABLET | Freq: Two times a day (BID) | ORAL | Status: DC
  Administered 2020-09-17 – 2020-09-19 (×4): 90 mg via ORAL
  Filled 2020-09-17 (×4): qty 1

## 2020-09-17 MED ORDER — ASPIRIN 81 MG PO CHEW: 81.0000 mg | CHEWABLE_TABLET | ORAL | Status: DC

## 2020-09-17 MED ORDER — SODIUM CHLORIDE 0.9 % WEIGHT BASED INFUSION
1.0000 mL/kg/h | INTRAVENOUS | Status: AC
  Administered 2020-09-17: 1 mL/kg/h via INTRAVENOUS

## 2020-09-17 MED ORDER — VERAPAMIL HCL 2.5 MG/ML IV SOLN
INTRAVENOUS | Status: DC | PRN
  Administered 2020-09-17: 10 mL via INTRA_ARTERIAL

## 2020-09-17 MED ORDER — FUROSEMIDE 10 MG/ML IJ SOLN
40.0000 mg | Freq: Once | INTRAMUSCULAR | Status: AC
  Administered 2020-09-17: 40 mg via INTRAVENOUS
  Filled 2020-09-17: qty 4

## 2020-09-17 SURGICAL SUPPLY — 22 items
BALLN SAPPHIRE 2.0X12 (BALLOONS) ×2
BALLN SAPPHIRE 2.5X12 (BALLOONS) ×2
BALLN SAPPHIRE ~~LOC~~ 3.25X15 (BALLOONS) ×2 IMPLANT
BALLOON SAPPHIRE 2.0X12 (BALLOONS) ×1 IMPLANT
BALLOON SAPPHIRE 2.5X12 (BALLOONS) ×1 IMPLANT
CATH 5FR JL3.5 JR4 ANG PIG MP (CATHETERS) ×2 IMPLANT
CATH DRAGONFLY OPSTAR (CATHETERS) ×2 IMPLANT
CATH LAUNCHER 5F EBU3.5 (CATHETERS) ×2 IMPLANT
CATH VISTA GUIDE 6FR XBLAD3.5 (CATHETERS) ×2 IMPLANT
DEVICE RAD COMP TR BAND LRG (VASCULAR PRODUCTS) ×2 IMPLANT
GLIDESHEATH SLEND SS 6F .021 (SHEATH) ×2 IMPLANT
GUIDEWIRE INQWIRE 1.5J.035X260 (WIRE) ×1 IMPLANT
INQWIRE 1.5J .035X260CM (WIRE) ×2
KIT ENCORE 26 ADVANTAGE (KITS) ×2 IMPLANT
KIT HEART LEFT (KITS) ×2 IMPLANT
PACK CARDIAC CATHETERIZATION (CUSTOM PROCEDURE TRAY) ×2 IMPLANT
STENT RESOLUTE ONYX 3.0X18 (Permanent Stent) ×2 IMPLANT
STENT RESOLUTE ONYX 3.0X8 (Permanent Stent) ×2 IMPLANT
TRANSDUCER W/STOPCOCK (MISCELLANEOUS) ×2 IMPLANT
TUBING CIL FLEX 10 FLL-RA (TUBING) ×2 IMPLANT
WIRE ASAHI PROWATER 180CM (WIRE) ×2 IMPLANT
WIRE HI TORQ VERSACORE-J 145CM (WIRE) ×2 IMPLANT

## 2020-09-17 NOTE — Research (Signed)
ID- [id number],  [email or web address]  Authorized Site Personnel  [x]   Shirley Muscat, RN []   Philemon Kingdom, RN  []   Amy Enid Derry, RCIS  []   Berneda Rose, RN   SUBJECT NAME: Jamie Cookey A. Pollina____ MRN: _[id number]_____________   Date of study introduction: __17-FEB-2022___   ([id number])    Time of introduction: __1600_______  (24 hour clock)  During the subject's hospital visit , the authorized site personnel discussed with  with the subject the possibility to participate in the SOS-AMI study, and provided  the subject with the approved Subject Information Leaflet (SIL)-Informed Consent form (ICF) in a language that he/she can understand.   The subject took the document to reflect on his/her participation in this study before signing it. She will also discuss this trial with her nephew, Dr. Betsey Holiday.    []   The Subject will return to the Cardiovascular Research office, at a later day, to            complete the consenting process.  OR  [x]   The Subject will be given ample time to reflect on this study and will be completing            consent process prior to his/ her discharge from this admission.   **Form based on IDORSIA [id number] [id number] [email or web address] [id number]

## 2020-09-17 NOTE — Progress Notes (Signed)
PROGRESS NOTE    Jamie Benjamin  DXA:[id number] DOB: October 21, 1945 DOA: 09/16/2020 PCP: Maurice Small, MD   Chief Complain: Chest pain  Brief Narrative: Patient is a 75 old female with history of hypothyroidism, obesity, hypertension, hyperlipidemia, remote history of breast cancer, depression who presented to the emergency department with complaints of chest pain. Chest pain started acutely, retrosternal, radiating into her throat and associated with right arm numbness. She was complaining of severe chest discomfort on presentation. Troponin was mildly elevated. CTA was negative for dissection. She was started on heparin drip and nitroglycerin for NSTEMI. Underwent cardiac cath today which  showed critical mid LAD stenosis -99%, status post stent placement.       Assessment & Plan:   Principal Problem:   ACS (acute coronary syndrome) (HCC) Active Problems:   Hyperlipidemia   Hypertension   Hypothyroidism   Major depressive disorder with current active episode   Class 1 obesity due to excess calories with body mass index (BMI) of 33.0 to 33.9 in adult   Prediabetes   Stage 3a chronic kidney disease (HCC)   Acute coronary syndrome/NSTEMI: Presented with substernal chest pain. Improved with nitroglycerin. Chest x-ray did not show any acute abnormality.  elevated troponin. EKG showed nonspecific ST changes. Started on heparin drip on presentation. Cardiology consulted. Underwent cardiac cath today with finding of critical LAD stenosis of 99%. Status post DES placement x2. Cardiology recommending DAPT for a year.She takes aspirin at home Follow-up with cardiology for further recommendation for discharge. Discharge planning tomorrow.  Hyperlipidemia: LDL of 78. Was taking pravastatin at home. Continue Lipitor 40 mg daily on discharge  Hypertension: On Norvasc/Cozaar at home. Also on beta-blocker. Continue these medications on discharge She remains hypertensive post cath. Will monitor her  blood pressure and adjust medications if needed.  Prediabetes: Hemoglobin A1c of 6.3. Monitor as an outpatient.Takes glipizide  CKD stage IIIa: Currently stable and kidney function at baseline. She follows with nephrology as an outpatient  Hypothyroidism: Continue Synthroid at current dose. TSH normal  Depression: On Wellbutrin at home  Obesity: BMI of 33.44          DVT prophylaxis:Heparin IV Code Status: Full Family Communication: Called son on phone ,call not received Status is: Inpatient  Remains inpatient appropriate because:Inpatient level of care appropriate due to severity of illness   Dispo: The patient is from: Home              Anticipated d/c is to: Home              Anticipated d/c date is: 1 day              Patient currently is not medically stable to d/c.   Difficult to place patient No   Consultants: Cardiology  Procedures:Cath  Antimicrobials:  Anti-infectives (From admission, onward)   None      Subjective: Patient seen and examined the bedside this morning.  Hemodynamically stable during my evaluation.  She was again having some retrosternal chest discomfort.  Waiting for cath.  Objective: Vitals:   09/17/20 1009 09/17/20 1030 09/17/20 1045 09/17/20 1130  BP: (!) 147/70 (!) 155/63 (!) 158/60 (!) 171/75  Pulse: 77 65 67 67  Resp: 17 18 19 19   Temp:      TempSrc:      SpO2: 100% 100% 99% 96%  Weight:      Height:        Intake/Output Summary (Last 24 hours) at 09/17/2020 1202 Last data filed  at 09/17/2020 1100 Gross per 24 hour  Intake --  Output 175 ml  Net -175 ml   Filed Weights   09/16/20 0434  Weight: 72.6 kg    Examination:  General exam: Appears calm and comfortable ,Not in distress,obese HEENT:PERRL,Oral mucosa moist, Ear/Nose normal on gross exam Respiratory system: Bilateral equal air entry, normal vesicular breath sounds, no wheezes or crackles  Cardiovascular system: S1 & S2 heard, RRR. No JVD, murmurs, rubs,  gallops or clicks. No pedal edema. Gastrointestinal system: Abdomen is nondistended, soft and nontender. No organomegaly or masses felt. Normal bowel sounds heard. Central nervous system: Alert and oriented. No focal neurological deficits. Extremities: No edema, no clubbing ,no cyanosis Skin: No rashes, lesions or ulcers,no icterus ,no pallor    Data Reviewed: I have personally reviewed following labs and imaging studies  CBC: Recent Labs  Lab 09/16/20 0431 09/17/20 0427  WBC 8.9 8.0  NEUTROABS 6.3  --   HGB 16.0* 13.8  HCT 48.5* 39.4  MCV 91.9 90.2  PLT 329 981   Basic Metabolic Panel: Recent Labs  Lab 09/16/20 0431  NA 139  K 5.0  CL 106  CO2 22  GLUCOSE 156*  BUN 31*  CREATININE 1.30*  CALCIUM 9.5   GFR: Estimated Creatinine Clearance: 31.6 mL/min (A) (by C-G formula based on SCr of 1.3 mg/dL (H)). Liver Function Tests: Recent Labs  Lab 09/16/20 0431  AST 37  ALT 36  ALKPHOS 80  BILITOT 1.3*  PROT 7.3  ALBUMIN 4.1   No results for input(s): LIPASE, AMYLASE in the last 168 hours. No results for input(s): AMMONIA in the last 168 hours. Coagulation Profile: Recent Labs  Lab 09/16/20 0431  INR 1.0   Cardiac Enzymes: No results for input(s): CKTOTAL, CKMB, CKMBINDEX, TROPONINI in the last 168 hours. BNP (last 3 results) No results for input(s): PROBNP in the last 8760 hours. HbA1C: Recent Labs    09/16/20 0431  HGBA1C 6.3*   CBG: Recent Labs  Lab 09/16/20 1154 09/16/20 1404 09/16/20 1711 09/17/20 0802 09/17/20 1058  GLUCAP 146* 107* 73 105* 108*   Lipid Profile: Recent Labs    09/16/20 0431  CHOL 157  HDL 45  LDLCALC 79  TRIG 167*  CHOLHDL 3.5   Thyroid Function Tests: Recent Labs    09/16/20 1218  TSH 4.189   Anemia Panel: No results for input(s): VITAMINB12, FOLATE, FERRITIN, TIBC, IRON, RETICCTPCT in the last 72 hours. Sepsis Labs: No results for input(s): PROCALCITON, LATICACIDVEN in the last 168 hours.  Recent Results  (from the past 240 hour(s))  Resp Panel by RT-PCR (Flu A&B, Covid) Nasopharyngeal Swab     Status: None   Collection Time: 09/16/20  4:40 AM   Specimen: Nasopharyngeal Swab; Nasopharyngeal(NP) swabs in vial transport medium  Result Value Ref Range Status   SARS Coronavirus 2 by RT PCR NEGATIVE NEGATIVE Final    Comment: (NOTE) SARS-CoV-2 target nucleic acids are NOT DETECTED.  The SARS-CoV-2 RNA is generally detectable in upper respiratory specimens during the acute phase of infection. The lowest concentration of SARS-CoV-2 viral copies this assay can detect is 138 copies/mL. A negative result does not preclude SARS-Cov-2 infection and should not be used as the sole basis for treatment or other patient management decisions. A negative result may occur with  improper specimen collection/handling, submission of specimen other than nasopharyngeal swab, presence of viral mutation(s) within the areas targeted by this assay, and inadequate number of viral copies(<138 copies/mL). A negative result must be  combined with clinical observations, patient history, and epidemiological information. The expected result is Negative.  Fact Sheet for Patients:  EntrepreneurPulse.com.au  Fact Sheet for Healthcare Providers:  IncredibleEmployment.be  This test is no t yet approved or cleared by the [place] FDA and  has been authorized for detection and/or diagnosis of SARS-CoV-2 by FDA under an Emergency Use Authorization (EUA). This EUA will remain  in effect (meaning this test can be used) for the duration of the COVID-19 declaration under Section 564(b)(1) of the Act, 21 U.S.C.section 360bbb-3(b)(1), unless the authorization is terminated  or revoked sooner.       Influenza A by PCR NEGATIVE NEGATIVE Final   Influenza B by PCR NEGATIVE NEGATIVE Final    Comment: (NOTE) The Xpert Xpress SARS-CoV-2/FLU/RSV plus assay is intended as an aid in the  diagnosis of influenza from Nasopharyngeal swab specimens and should not be used as a sole basis for treatment. Nasal washings and aspirates are unacceptable for Xpert Xpress SARS-CoV-2/FLU/RSV testing.  Fact Sheet for Patients: EntrepreneurPulse.com.au  Fact Sheet for Healthcare Providers: IncredibleEmployment.be  This test is not yet approved or cleared by the [place] FDA and has been authorized for detection and/or diagnosis of SARS-CoV-2 by FDA under an Emergency Use Authorization (EUA). This EUA will remain in effect (meaning this test can be used) for the duration of the COVID-19 declaration under Section 564(b)(1) of the Act, 21 U.S.C. section 360bbb-3(b)(1), unless the authorization is terminated or revoked.  Performed at [facility], [place] [address], [place], [place] [address]          Radiology Studies: CARDIAC CATHETERIZATION  Result Date: 09/17/2020  Prox LAD to Mid LAD lesion is 99% stenosed.  Post intervention, there is a 0% residual stenosis.  A drug-eluting stent was successfully placed using a STENT RESOLUTE ONYX 3.0X18.  A drug-eluting stent was successfully placed using a STENT RESOLUTE ONYX 3.0X8.  The left ventricular systolic function is normal.  LV end diastolic pressure is moderately elevated.  The left ventricular ejection fraction is 50-55% by visual estimate.  1. Single vessel occlusive CAD. 99% mid LAD with TIMI 1 flow. 2. Inferoapical HK with low normal EF 3. Moderately elevated LVEDP 4. Successful PCI of the mid LAD with OCT guidance and DES x 2. Plan: DAPT for one year. Risk factor modification. Anticipate DC in am.   DG Chest Port 1 View  Result Date: 09/16/2020 CLINICAL DATA:  Chest pain EXAM: PORTABLE CHEST 1 VIEW COMPARISON:  None. FINDINGS: Borderline heart size accentuated by apical fat pad, confirmed by 2021 abdominal CT. Unremarkable mediastinal contours for technique. There is no edema,  consolidation, effusion, or pneumothorax. Artifact from EKG leads. IMPRESSION: No acute finding. Electronically Signed   By: Monte Fantasia M.D.   On: 09/16/2020 05:07   ECHOCARDIOGRAM COMPLETE  Result Date: 09/16/2020    ECHOCARDIOGRAM REPORT   Patient Name:   SHARIYA GASTER Date of Exam: 09/16/2020 Medical Rec #:  [id number]       Height:       58.0 in Accession #:    [id number]      Weight:       160.0 lb Date of Birth:  February 15, 1946      BSA:          1.656 m Patient Age:    49 years        BP:           121/95 mmHg Patient Gender: F  HR:           66 bpm. Exam Location:  Inpatient Procedure: 2D Echo, Color Doppler and Cardiac Doppler Indications:    R07.9* Chest pain, unspecified  History:        Patient has prior history of Echocardiogram examinations, most                 recent 10/24/2017. Risk Factors:Hypertension and Dyslipidemia.  Sonographer:    Raquel Sarna Senior RDCS Referring Phys: [id number] JENNIFER YATES  Sonographer Comments: Technically difficult due to mastectomy. IMPRESSIONS  1. Technically difficult study. Left ventricular ejection fraction, by estimation, is 50 to 55%. The left ventricle has grossly normal function. Left ventricular endocardial border not optimally defined to evaluate regional wall motion. Appears to be hypokinesis of mid to apical anteroseptum. Left ventricular diastolic parameters are consistent with Grade I diastolic dysfunction (impaired relaxation).  2. Right ventricular systolic function is mildly reduced. The right ventricular size is normal. Tricuspid regurgitation signal is inadequate for assessing PA pressure.  3. The mitral valve is normal in structure. No evidence of mitral valve regurgitation.  4. The aortic valve is tricuspid. Aortic valve regurgitation is not visualized. No aortic stenosis is present.  5. The inferior vena cava is normal in size with greater than 50% respiratory variability, suggesting right atrial pressure of 3 mmHg. FINDINGS  Left  Ventricle: Left ventricular ejection fraction, by estimation, is 50 to 55%. The left ventricle has low normal function. Left ventricular endocardial border not optimally defined to evaluate regional wall motion. The left ventricular internal cavity  size was normal in size. There is no left ventricular hypertrophy. Left ventricular diastolic parameters are consistent with Grade I diastolic dysfunction (impaired relaxation). Right Ventricle: The right ventricular size is normal. Right vetricular wall thickness was not well visualized. Right ventricular systolic function is mildly reduced. Tricuspid regurgitation signal is inadequate for assessing PA pressure. Left Atrium: Left atrial size was normal in size. Right Atrium: Right atrial size was normal in size. Pericardium: There is no evidence of pericardial effusion. Mitral Valve: The mitral valve is normal in structure. No evidence of mitral valve regurgitation. Tricuspid Valve: The tricuspid valve is normal in structure. Tricuspid valve regurgitation is not demonstrated. Aortic Valve: The aortic valve is tricuspid. Aortic valve regurgitation is not visualized. No aortic stenosis is present. Pulmonic Valve: The pulmonic valve was not well visualized. Pulmonic valve regurgitation is not visualized. Aorta: The aortic root and ascending aorta are structurally normal, with no evidence of dilitation. Venous: The inferior vena cava is normal in size with greater than 50% respiratory variability, suggesting right atrial pressure of 3 mmHg. IAS/Shunts: The interatrial septum was not well visualized.  LEFT VENTRICLE PLAX 2D LVIDd:         4.20 cm  Diastology LVIDs:         2.70 cm  LV e' medial:    4.03 cm/s LV PW:         0.90 cm  LV E/e' medial:  11.6 LV IVS:        0.80 cm  LV e' lateral:   4.35 cm/s LVOT diam:     1.80 cm  LV E/e' lateral: 10.7 LV SV:         42 LV SV Index:   26 LVOT Area:     2.54 cm  RIGHT VENTRICLE RV S prime:     9.25 cm/s TAPSE (M-mode): 1.5 cm  LEFT ATRIUM  Index       RIGHT ATRIUM          Index LA diam:        3.50 cm 2.11 cm/m  RA Area:     8.47 cm LA Vol (A2C):   31.0 ml 18.72 ml/m RA Volume:   14.40 ml 8.69 ml/m LA Vol (A4C):   36.4 ml 21.98 ml/m LA Biplane Vol: 35.5 ml 21.43 ml/m  AORTIC VALVE LVOT Vmax:   74.40 cm/s LVOT Vmean:  59.200 cm/s LVOT VTI:    0.166 m  AORTA Ao Root diam: 2.40 cm Ao Asc diam:  2.80 cm MITRAL VALVE MV Area (PHT): 1.46 cm    SHUNTS MV Decel Time: 518 msec    Systemic VTI:  0.17 m MV E velocity: 46.70 cm/s  Systemic Diam: 1.80 cm MV A velocity: 81.80 cm/s MV E/A ratio:  0.57 Oswaldo Milian MD Electronically signed by Oswaldo Milian MD Signature Date/Time: 09/16/2020/3:40:32 PM    Final    CT Angio Chest/Abd/Pel for Dissection W and/or W/WO  Result Date: 09/16/2020 CLINICAL DATA:  Chest or back pain with aortic dissection suspected EXAM: CT ANGIOGRAPHY CHEST, ABDOMEN AND PELVIS TECHNIQUE: Non-contrast CT of the chest was initially obtained. Multidetector CT imaging through the chest, abdomen and pelvis was performed using the standard protocol during bolus administration of intravenous contrast. Multiplanar reconstructed images and MIPs were obtained and reviewed to evaluate the vascular anatomy. CONTRAST:  78mL OMNIPAQUE IOHEXOL 350 MG/ML SOLN COMPARISON:  None. FINDINGS: CTA CHEST FINDINGS Cardiovascular: Normal heart size. No pericardial effusion. No intramural aortic hematoma. Aortic and coronary atheromatous calcification. Negative for aortic dissection or aneurysm. Well opacified pulmonary arteries that are negative for filling defect Mediastinum/Nodes: Negative for adenopathy, mass, or hematoma. Lungs/Pleura: Areas of bronchial airway collapse/thickening. There is no edema, consolidation, effusion, or pneumothorax. Musculoskeletal: Spondylosis with multilevel thoracic ankylosis from bridging osteophyte. Remote T12 compression fracture with mild height loss. Review of the MIP images  confirms the above findings. CTA ABDOMEN AND PELVIS FINDINGS VASCULAR Aorta: Atheromatous plaque diffusely.  No aneurysm or dissection. Celiac: Patent without evidence of aneurysm, dissection, vasculitis or significant stenosis. SMA: Patent without evidence of aneurysm, dissection, vasculitis or significant stenosis. Renals: Plaque at the ostia without stenosis, beading, or dissection. Negative for aneurysm. IMA: Patent Inflow: Atheromatous plaque of the iliac arteries. No stenosis or dissection. Veins: Negative Review of the MIP images confirms the above findings. NON-VASCULAR Hepatobiliary: Low-density of the liver may be related to contrast timing. Negative for visible liver lesion.No evidence of biliary obstruction or stone. Pancreas: Unremarkable. Spleen: Unremarkable. Adrenals/Urinary Tract: Negative adrenals. No hydronephrosis or stone. Bilateral renal cysts with simple CT appearance. Unremarkable bladder. Stomach/Bowel: No obstruction. No visible bowel inflammation. Innumerable colonic diverticula. Lymphatic: No mass or adenopathy. Reproductive:No pathologic findings. Other: No ascites or pneumoperitoneum. Musculoskeletal: Lumbar spine degeneration with L4-5 anterolisthesis. No acute finding. Review of the MIP images confirms the above findings. IMPRESSION: 1. Negative for acute aortic syndrome or other specific cause of symptoms. 2.  Aortic Atherosclerosis (ICD10-I70.0).  Coronary atherosclerosis. 3. 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 4. Extensive colonic diverticulosis. Electronically Signed   By: Monte Fantasia M.D.   On: 09/16/2020 09:29        Scheduled Meds: . [MAR Hold] aspirin EC  81 mg Oral Daily  . [MAR Hold] atorvastatin  40 mg Oral QHS  . [MAR Hold] buPROPion  300 mg Oral Daily  . [MAR Hold] insulin aspart  0-15 Units Subcutaneous TID WC  . [facility]  Hold] insulin aspart  0-5 Units Subcutaneous QHS  . [MAR Hold] levothyroxine  50 mcg Oral QAC breakfast  . [MAR Hold]  metoprolol tartrate  100 mg Oral BID  . [MAR Hold] sodium chloride flush  3 mL Intravenous Q12H   Continuous Infusions: . sodium chloride    . sodium chloride 1 mL/kg/hr (09/17/20 0523)  . heparin 900 Units/hr (09/16/20 2046)  . [MAR Hold] nitroGLYCERIN Stopped (09/17/20 1008)     LOS: 1 day    Time spent: 25 mins.More than 50% of that time was spent in counseling and/or coordination of care.      Shelly Coss, MD [facility] P2/17/2022, 12:02 PM

## 2020-09-17 NOTE — Progress Notes (Signed)
Patient left for cath lab and was notified that patient will not be returning back to unit 4E. I have gathered her personal belongings and placed them in patient belongings bag at the nurses station until we find out which unit she will be admitted to.  Daymon Larsen, RN

## 2020-09-17 NOTE — Interval H&P Note (Signed)
History and Physical Interval Note:  09/17/2020 8:37 AM  Maureen Chatters  has presented today for surgery, with the diagnosis of chest pain.  The various methods of treatment have been discussed with the patient and family. After consideration of risks, benefits and other options for treatment, the patient has consented to  Procedure(s): LEFT HEART CATH AND CORONARY ANGIOGRAPHY (N/A) as a surgical intervention.  The patient's history has been reviewed, patient examined, no change in status, stable for surgery.  I have reviewed the patient's chart and labs.  Questions were answered to the patient's satisfaction.   Cath Lab Visit (complete for each Cath Lab visit)  Clinical Evaluation Leading to the Procedure:   ACS: Yes.    Non-ACS:    Anginal Classification: CCS IV  Anti-ischemic medical therapy: Maximal Therapy (2 or more classes of medications)  Non-Invasive Test Results: No non-invasive testing performed  Prior CABG: No previous CABG        Collier Salina [facility] 09/17/2020 8:37 AM

## 2020-09-17 NOTE — Progress Notes (Signed)
ANTICOAGULATION CONSULT NOTE - Follow Up Consult  Pharmacy Consult for heparin Indication: NSTEMI  Labs: Recent Labs    09/16/20 0431 09/16/20 0624 09/16/20 1916 09/17/20 0427  HGB 16.0*  --   --  13.8  HCT 48.5*  --   --  39.4  PLT 329  --   --  273  APTT 30  --   --   --   LABPROT 12.5  --   --   --   INR 1.0  --   --   --   HEPARINUNFRC  --   --  0.16* 0.53  CREATININE 1.30*  --   --   --   TROPONINIHS 10 95* 746*  --     Assessment/Plan:  75yo female therapeutic on heparin after rate change. Will continue gtt at current rate and confirm stable with additional level.   Wynona Neat, PharmD, BCPS  09/17/2020,5:13 AM

## 2020-09-18 ENCOUNTER — Other Ambulatory Visit: Payer: Self-pay | Admitting: Internal Medicine

## 2020-09-18 ENCOUNTER — Encounter (HOSPITAL_COMMUNITY): Payer: Self-pay | Admitting: Cardiology

## 2020-09-18 ENCOUNTER — Encounter: Payer: Self-pay | Admitting: *Deleted

## 2020-09-18 DIAGNOSIS — I249 Acute ischemic heart disease, unspecified: Secondary | ICD-10-CM | POA: Diagnosis not present

## 2020-09-18 DIAGNOSIS — E782 Mixed hyperlipidemia: Secondary | ICD-10-CM

## 2020-09-18 DIAGNOSIS — I214 Non-ST elevation (NSTEMI) myocardial infarction: Secondary | ICD-10-CM | POA: Diagnosis not present

## 2020-09-18 DIAGNOSIS — Z006 Encounter for examination for normal comparison and control in clinical research program: Secondary | ICD-10-CM

## 2020-09-18 LAB — BASIC METABOLIC PANEL
Anion gap: 11 (ref 5–15)
Anion gap: 13 (ref 5–15)
BUN: 24 mg/dL — ABNORMAL HIGH (ref 8–23)
BUN: 19 mg/dL (ref 8–23)
CO2: 24 mmol/L (ref 22–32)
CO2: 22 mmol/L (ref 22–32)
Calcium: 9 mg/dL (ref 8.9–10.3)
Calcium: 9.5 mg/dL (ref 8.9–10.3)
Chloride: 104 mmol/L (ref 98–111)
Chloride: 102 mmol/L (ref 98–111)
Creatinine, Ser: 1.44 mg/dL — ABNORMAL HIGH (ref 0.44–1.00)
Creatinine, Ser: 1.55 mg/dL — ABNORMAL HIGH (ref 0.44–1.00)
GFR, Estimated: 38 mL/min — ABNORMAL LOW (ref >60)
GFR, Estimated: 35 mL/min — ABNORMAL LOW (ref >60)
Glucose, Bld: 134 mg/dL — ABNORMAL HIGH (ref 70–99)
Glucose, Bld: 247 mg/dL — ABNORMAL HIGH (ref 70–99)
Potassium: 3.7 mmol/L (ref 3.5–5.1)
Potassium: 4 mmol/L (ref 3.5–5.1)
Sodium: 139 mmol/L (ref 135–145)
Sodium: 137 mmol/L (ref 135–145)

## 2020-09-18 LAB — CBC
HCT: 41.9 % (ref 36.0–46.0)
Hemoglobin: 14.2 g/dL (ref 12.0–15.0)
MCH: 30.5 pg (ref 26.0–34.0)
MCHC: 33.9 g/dL (ref 30.0–36.0)
MCV: 89.9 fL (ref 80.0–100.0)
Platelets: 265 10*3/uL (ref 150–400)
RBC: 4.66 MIL/uL (ref 3.87–5.11)
RDW: 13.3 % (ref 11.5–15.5)
WBC: 10.1 10*3/uL (ref 4.0–10.5)
nRBC: 0 % (ref 0.0–0.2)

## 2020-09-18 LAB — GLUCOSE, CAPILLARY
Glucose-Capillary: 155 mg/dL — ABNORMAL HIGH (ref 70–99)
Glucose-Capillary: 98 mg/dL (ref 70–99)
Glucose-Capillary: 67 mg/dL — ABNORMAL LOW (ref 70–99)
Glucose-Capillary: 106 mg/dL — ABNORMAL HIGH (ref 70–99)
Glucose-Capillary: 159 mg/dL — ABNORMAL HIGH (ref 70–99)

## 2020-09-18 MED ORDER — TICAGRELOR 90 MG PO TABS: 90.0000 mg | ORAL_TABLET | Freq: Two times a day (BID) | ORAL | 3 refills | Status: DC

## 2020-09-18 MED ORDER — SODIUM CHLORIDE 0.9 % IV SOLN: INTRAVENOUS | Status: DC

## 2020-09-18 MED ORDER — PRAVASTATIN SODIUM 40 MG PO TABS
40.0000 mg | ORAL_TABLET | Freq: Every day | ORAL | Status: DC
  Administered 2020-09-18: 40 mg via ORAL
  Filled 2020-09-18: qty 1

## 2020-09-18 MED FILL — BRILINTA 90 MG TABLET: 90 | 30 days supply | Qty: 60 | Fill #0

## 2020-09-18 NOTE — Progress Notes (Addendum)
Progress Note  Patient Name: Jamie Benjamin Date of Encounter: 09/18/2020  Primary Cardiologist: Dr. Gwenlyn Found, MD   Subjective   Doing well post cath. No chest pain.  Inpatient Medications    Scheduled Meds: . amLODipine  10 mg Oral Daily  . aspirin EC  81 mg Oral Daily  . atorvastatin  40 mg Oral QHS  . buPROPion  300 mg Oral Daily  . enoxaparin (LOVENOX) injection  40 mg Subcutaneous Q24H  . insulin aspart  0-15 Units Subcutaneous TID WC  . insulin aspart  0-5 Units Subcutaneous QHS  . levothyroxine  50 mcg Oral QAC breakfast  . metoprolol tartrate  100 mg Oral BID  . polyethylene glycol  17 g Oral Daily  . sodium chloride flush  3 mL Intravenous Q12H  . ticagrelor  90 mg Oral BID   Continuous Infusions: . sodium chloride     PRN Meds: sodium chloride, acetaminophen, hydrALAZINE, melatonin, morphine injection, ondansetron (ZOFRAN) IV, sodium chloride flush   Vital Signs    Vitals:   09/17/20 2125 09/17/20 2155 09/18/20 0012 09/18/20 0336  BP: (!) 118/103 122/67 112/66 125/61  Pulse: 71 71 77 72  Resp:   18 18  Temp:   98.4 F (36.9 C) 98.2 F (36.8 C)  TempSrc:   Oral Oral  SpO2: 97% 97% 98% 99%  Weight:    74.3 kg  Height:        Intake/Output Summary (Last 24 hours) at 09/18/2020 0723 Last data filed at 09/17/2020 1930 Gross per 24 hour  Intake 240 ml  Output 2275 ml  Net -2035 ml   Filed Weights   09/16/20 0434 09/18/20 0336  Weight: 72.6 kg 74.3 kg    Physical Exam   General: Well developed, well nourished, NAD Neck: Negative for carotid bruits. No JVD Lungs:Clear to ausculation bilaterally. No wheezes, rales, or rhonchi. Breathing is unlabored. Cardiovascular: RRR with S1 S2. No murmurs Abdomen: Soft, non-tender, non-distended. No obvious abdominal masses. Extremities: No edema. Radial pulses 2+ bilaterally Neuro: Alert and oriented. No focal deficits. No facial asymmetry. MAE spontaneously. Psych: Responds to questions appropriately  with normal affect.    Labs    Chemistry Recent Labs  Lab 09/16/20 0431 09/18/20 0140  NA 139 139  K 5.0 3.7  CL 106 104  CO2 22 24  GLUCOSE 156* 134*  BUN 31* 24*  CREATININE 1.30* 1.44*  CALCIUM 9.5 9.0  PROT 7.3  --   ALBUMIN 4.1  --   AST 37  --   ALT 36  --   ALKPHOS 80  --   BILITOT 1.3*  --   GFRNONAA 43* 38*  ANIONGAP 11 11     Hematology Recent Labs  Lab 09/16/20 0431 09/17/20 0427 09/18/20 0140  WBC 8.9 8.0 10.1  RBC 5.28* 4.37 4.66  HGB 16.0* 13.8 14.2  HCT 48.5* 39.4 41.9  MCV 91.9 90.2 89.9  MCH 30.3 31.6 30.5  MCHC 33.0 35.0 33.9  RDW 13.0 13.3 13.3  PLT 329 273 265    Cardiac EnzymesNo results for input(s): TROPONINI in the last 168 hours. No results for input(s): TROPIPOC in the last 168 hours.   BNPNo results for input(s): BNP, PROBNP in the last 168 hours.   DDimer No results for input(s): DDIMER in the last 168 hours.   Radiology    CARDIAC CATHETERIZATION  Result Date: 09/17/2020  Prox LAD to Mid LAD lesion is 99% stenosed.  Post intervention, there is  a 0% residual stenosis.  A drug-eluting stent was successfully placed using a STENT RESOLUTE ONYX 3.0X18.  A drug-eluting stent was successfully placed using a STENT RESOLUTE ONYX 3.0X8.  The left ventricular systolic function is normal.  LV end diastolic pressure is moderately elevated.  The left ventricular ejection fraction is 50-55% by visual estimate.  1. Single vessel occlusive CAD. 99% mid LAD with TIMI 1 flow. 2. Inferoapical HK with low normal EF 3. Moderately elevated LVEDP 4. Successful PCI of the mid LAD with OCT guidance and DES x 2. Plan: DAPT for one year. Risk factor modification. Anticipate DC in am.   ECHOCARDIOGRAM COMPLETE  Result Date: 09/16/2020    ECHOCARDIOGRAM REPORT   Patient Name:   GHAZAL PEVEY Date of Exam: 09/16/2020 Medical Rec #:  [id number]       Height:       58.0 in Accession #:    [id number]      Weight:       160.0 lb Date of Birth:  Jun 18, 1946       BSA:          1.656 m Patient Age:    75 years        BP:           121/95 mmHg Patient Gender: F               HR:           66 bpm. Exam Location:  Inpatient Procedure: 2D Echo, Color Doppler and Cardiac Doppler Indications:    R07.9* Chest pain, unspecified  History:        Patient has prior history of Echocardiogram examinations, most                 recent 10/24/2017. Risk Factors:Hypertension and Dyslipidemia.  Sonographer:    Raquel Sarna Senior RDCS Referring Phys: [id number] JENNIFER YATES  Sonographer Comments: Technically difficult due to mastectomy. IMPRESSIONS  1. Technically difficult study. Left ventricular ejection fraction, by estimation, is 50 to 55%. The left ventricle has grossly normal function. Left ventricular endocardial border not optimally defined to evaluate regional wall motion. Appears to be hypokinesis of mid to apical anteroseptum. Left ventricular diastolic parameters are consistent with Grade I diastolic dysfunction (impaired relaxation).  2. Right ventricular systolic function is mildly reduced. The right ventricular size is normal. Tricuspid regurgitation signal is inadequate for assessing PA pressure.  3. The mitral valve is normal in structure. No evidence of mitral valve regurgitation.  4. The aortic valve is tricuspid. Aortic valve regurgitation is not visualized. No aortic stenosis is present.  5. The inferior vena cava is normal in size with greater than 50% respiratory variability, suggesting right atrial pressure of 3 mmHg. FINDINGS  Left Ventricle: Left ventricular ejection fraction, by estimation, is 50 to 55%. The left ventricle has low normal function. Left ventricular endocardial border not optimally defined to evaluate regional wall motion. The left ventricular internal cavity  size was normal in size. There is no left ventricular hypertrophy. Left ventricular diastolic parameters are consistent with Grade I diastolic dysfunction (impaired relaxation). Right Ventricle: The right  ventricular size is normal. Right vetricular wall thickness was not well visualized. Right ventricular systolic function is mildly reduced. Tricuspid regurgitation signal is inadequate for assessing PA pressure. Left Atrium: Left atrial size was normal in size. Right Atrium: Right atrial size was normal in size. Pericardium: There is no evidence of pericardial effusion. Mitral Valve: The mitral valve is normal in  structure. No evidence of mitral valve regurgitation. Tricuspid Valve: The tricuspid valve is normal in structure. Tricuspid valve regurgitation is not demonstrated. Aortic Valve: The aortic valve is tricuspid. Aortic valve regurgitation is not visualized. No aortic stenosis is present. Pulmonic Valve: The pulmonic valve was not well visualized. Pulmonic valve regurgitation is not visualized. Aorta: The aortic root and ascending aorta are structurally normal, with no evidence of dilitation. Venous: The inferior vena cava is normal in size with greater than 50% respiratory variability, suggesting right atrial pressure of 3 mmHg. IAS/Shunts: The interatrial septum was not well visualized.  LEFT VENTRICLE PLAX 2D LVIDd:         4.20 cm  Diastology LVIDs:         2.70 cm  LV e' medial:    4.03 cm/s LV PW:         0.90 cm  LV E/e' medial:  11.6 LV IVS:        0.80 cm  LV e' lateral:   4.35 cm/s LVOT diam:     1.80 cm  LV E/e' lateral: 10.7 LV SV:         42 LV SV Index:   26 LVOT Area:     2.54 cm  RIGHT VENTRICLE RV S prime:     9.25 cm/s TAPSE (M-mode): 1.5 cm LEFT ATRIUM             Index       RIGHT ATRIUM          Index LA diam:        3.50 cm 2.11 cm/m  RA Area:     8.47 cm LA Vol (A2C):   31.0 ml 18.72 ml/m RA Volume:   14.40 ml 8.69 ml/m LA Vol (A4C):   36.4 ml 21.98 ml/m LA Biplane Vol: 35.5 ml 21.43 ml/m  AORTIC VALVE LVOT Vmax:   74.40 cm/s LVOT Vmean:  59.200 cm/s LVOT VTI:    0.166 m  AORTA Ao Root diam: 2.40 cm Ao Asc diam:  2.80 cm MITRAL VALVE MV Area (PHT): 1.46 cm    SHUNTS MV Decel  Time: 518 msec    Systemic VTI:  0.17 m MV E velocity: 46.70 cm/s  Systemic Diam: 1.80 cm MV A velocity: 81.80 cm/s MV E/A ratio:  0.57 Oswaldo Milian MD Electronically signed by Oswaldo Milian MD Signature Date/Time: 09/16/2020/3:40:32 PM    Final    CT Angio Chest/Abd/Pel for Dissection W and/or W/WO  Result Date: 09/16/2020 CLINICAL DATA:  Chest or back pain with aortic dissection suspected EXAM: CT ANGIOGRAPHY CHEST, ABDOMEN AND PELVIS TECHNIQUE: Non-contrast CT of the chest was initially obtained. Multidetector CT imaging through the chest, abdomen and pelvis was performed using the standard protocol during bolus administration of intravenous contrast. Multiplanar reconstructed images and MIPs were obtained and reviewed to evaluate the vascular anatomy. CONTRAST:  83mL OMNIPAQUE IOHEXOL 350 MG/ML SOLN COMPARISON:  None. FINDINGS: CTA CHEST FINDINGS Cardiovascular: Normal heart size. No pericardial effusion. No intramural aortic hematoma. Aortic and coronary atheromatous calcification. Negative for aortic dissection or aneurysm. Well opacified pulmonary arteries that are negative for filling defect Mediastinum/Nodes: Negative for adenopathy, mass, or hematoma. Lungs/Pleura: Areas of bronchial airway collapse/thickening. There is no edema, consolidation, effusion, or pneumothorax. Musculoskeletal: Spondylosis with multilevel thoracic ankylosis from bridging osteophyte. Remote T12 compression fracture with mild height loss. Review of the MIP images confirms the above findings. CTA ABDOMEN AND PELVIS FINDINGS VASCULAR Aorta: Atheromatous plaque diffusely.  No aneurysm or dissection. Celiac:  Patent without evidence of aneurysm, dissection, vasculitis or significant stenosis. SMA: Patent without evidence of aneurysm, dissection, vasculitis or significant stenosis. Renals: Plaque at the ostia without stenosis, beading, or dissection. Negative for aneurysm. IMA: Patent Inflow: Atheromatous plaque of the  iliac arteries. No stenosis or dissection. Veins: Negative Review of the MIP images confirms the above findings. NON-VASCULAR Hepatobiliary: Low-density of the liver may be related to contrast timing. Negative for visible liver lesion.No evidence of biliary obstruction or stone. Pancreas: Unremarkable. Spleen: Unremarkable. Adrenals/Urinary Tract: Negative adrenals. No hydronephrosis or stone. Bilateral renal cysts with simple CT appearance. Unremarkable bladder. Stomach/Bowel: No obstruction. No visible bowel inflammation. Innumerable colonic diverticula. Lymphatic: No mass or adenopathy. Reproductive:No pathologic findings. Other: No ascites or pneumoperitoneum. Musculoskeletal: Lumbar spine degeneration with L4-5 anterolisthesis. No acute finding. Review of the MIP images confirms the above findings. IMPRESSION: 1. Negative for acute aortic syndrome or other specific cause of symptoms. 2.  Aortic Atherosclerosis (ICD10-I70.0).  Coronary atherosclerosis. 3. 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 4. Extensive colonic diverticulosis. Electronically Signed   By: Monte Fantasia M.D.   On: 09/16/2020 09:29   Telemetry    09/18/20 NSR - Personally Reviewed  ECG    No new tracing as of 09/18/20- Personally Reviewed  Cardiac Studies   LHC 09/17/2020:   Prox LAD to Mid LAD lesion is 99% stenosed.  Post intervention, there is a 0% residual stenosis.  A drug-eluting stent was successfully placed using a STENT RESOLUTE ONYX 3.0X18.  A drug-eluting stent was successfully placed using a STENT RESOLUTE ONYX 3.0X8.  The left ventricular systolic function is normal.  LV end diastolic pressure is moderately elevated.  The left ventricular ejection fraction is 50-55% by visual estimate.   1. Single vessel occlusive CAD. 99% mid LAD with TIMI 1 flow. 2. Inferoapical HK with low normal EF 3. Moderately elevated LVEDP 4. Successful PCI of the mid LAD with OCT guidance and DES x 2.  Plan: DAPT  for one year. Risk factor modification. Anticipate DC in am.   Echocardiogram 09/16/2020:  1. Technically difficult study. Left ventricular ejection fraction, by  estimation, is 50 to 55%. The left ventricle has grossly normal function.  Left ventricular endocardial border not optimally defined to evaluate  regional wall motion. Appears to be  hypokinesis of mid to apical anteroseptum. Left ventricular diastolic  parameters are consistent with Grade I diastolic dysfunction (impaired  relaxation).  2. Right ventricular systolic function is mildly reduced. The right  ventricular size is normal. Tricuspid regurgitation signal is inadequate  for assessing PA pressure.  3. The mitral valve is normal in structure. No evidence of mitral valve  regurgitation.  4. The aortic valve is tricuspid. Aortic valve regurgitation is not  visualized. No aortic stenosis is present.  5. The inferior vena cava is normal in size with greater than 50%  respiratory variability, suggesting right atrial pressure of 3 mmHg.   Patient Profile     75 y.o. female with a hx of HTN, HLD, prediabetes, hypothyroidism, depression and history of breast cancer s/p bilateral radical mastectomy 16 years ago who is being seen today for the evaluation of chest pain at the request of Dr. Lorin Mercy.  Assessment & Plan   1. NSTEMI: -Patient presented with burning chest pressure who presented to the ED. CTA with no dissection however coronary atherosclerosis. Given elevated troponin, she was started on IV heparin with plan for cardiac catheterization -LHC performed 09/17/2020 that showed single-vessel CAD with 99% mid LAD treated with  DES x2 with plan for DAPT for 1 year with risk factor modification -Echo 09/16/2020 with low normal LVEF at 50 to 55% with hypokinesis of the mid to apical anterior septum with G1 DD and no valvular disease -Peak troponin, 746 -Appears euvolemic on exam -Continue ASA , Brilinta, statin and  beta-blocker -Post cath creatinine 1.44 today which appears to be slightly up from admission creatinine at 1.3 with no baseline for comparison  2. HTN: -Stable, 125/61, 112/66, 122/67 -On PTA amlodipine 10 and metoprolol titrate  3. HLD: -LDL, 79 on 09/16/20 with a goal of <70 given CAD -Transition from Pravachol to atorvastatin this admission -Repeat labs in 6 to 8 weeks  4. Prediabetes: -Hemoglobin A1c, 6.3 -Pharm to speak to her about the benefits of SGLT2 given renal d/s and CAD   5. Hypothyroidism: -Maintained with levothyroxine per primary team  6. History of breast CA with bilateral radical mastectomy: -Stable with no issues this admission   Signed, Kathyrn Drown NP-C [facility] Pager: [phone] 09/18/2020, 7:23 AM    Agree with note by Kathyrn Drown NP  Ms. Cerny was admitted with non-STEMI.  Her peak troponin was in the 700 range.  She underwent diagnostic coronary angiography via the right radial approach yesterday by Dr. Martinique.  She had OCT guided PCI and drug-eluting stenting of the proximal LAD after the first diagonal branch with 2 stents because of distal edge dissection in the for stent.  The remainder of her coronary anatomy was free of significant disease.  Her EF was preserved.  She did have an apical wall motion abnormality.  She is on dual antiplatelet therapy.  Her exam is benign this morning.  Her LDL is not at goal on pravastatin 40.  We will discuss Zetia and/or PCSK9 when I see her back in the office in 3 to 4 weeks.  She stable for discharge.   Lorretta Harp, M.D., [place], [facility], [place] [address] [place], [place]  [address]  [phone] 09/18/2020 9:58 AM  For questions or updates, please contact   Please consult [email or web address] for contact info under Cardiology/STEMI.

## 2020-09-18 NOTE — Care Management (Signed)
[id number] 09-18-20 Benefits check submitted for Brilinta. Case Manager will follow for cost. Graves-Bigelow, Ocie Cornfield, RN,BSN Case Manager

## 2020-09-18 NOTE — TOC Benefit Eligibility Note (Signed)
Transition of Care [facility]) Benefit Eligibility Note    Patient Details  Name: Jamie Benjamin MRN: [id number] Date of Birth: Nov 19, 1945   Medication/Dose: Kary Kos 90mg . bid  Covered?: Yes  Tier: 2 Drug  Prescription Coverage [facility]erred [facility]: [facility]  Spoke with Person/Company/Phone Number:: Sharnitta T. W/[facility] PH# [phone]  Co-Pay: $30.00  Prior Approval: No  Deductible:  (no deductible)       Shelda Altes Phone Number: 09/18/2020, 8:24 AM

## 2020-09-18 NOTE — Progress Notes (Addendum)
PROGRESS NOTE    Jamie Benjamin  XTK:[id number] DOB: 15-Jul-1946 DOA: 09/16/2020 PCP: Maurice Small, MD   Brief Narrative: 75 year old with past medical history significant for hypothyroidism, obesity, hypertension, hyperlipidemia, remote history of breast cancer, depression who presented to the emergency department with complaints of chest pain.  Chest pain started acutely, retrosternal, radiating into her throat with associated with right arm numbness.  She was complaining of severe chest discomfort on presentation.  Troponin was mildly elevated.  CTA was negative for dissection.  She was a started on heparin drip and nitroglycerin drip for non-STEMI.  Underwent cardiac cath which showed critical LAD stenosis 99%, status post a stent     Assessment & Plan:   Principal Problem:   ACS (acute coronary syndrome) (HCC) Active Problems:   Hyperlipidemia   Hypertension   Hypothyroidism   Major depressive disorder with current active episode   Class 1 obesity due to excess calories with body mass index (BMI) of 33.0 to 33.9 in adult   Prediabetes   Stage 3a chronic kidney disease (HCC)  1-non-STEMI acute coronary syndrome: Patient presented with chest pain, troponin increased up to 700. Initially she was treated with IV nitroglycerin drip, heparin drip. Underwent cardiac cath which showed critical LAD stenosis of 99%.  Status post DES placement x2. Cardiology recommend DAPT for a year  2-HLD; : LDL 78. Patient is not able to tolerate Lipitor.  Plan to resume home pravastatin.  She will follow up with Dr Gwenlyn Found to discuss Zetia and or PCSK9    3-Hypertension: Continue with Norvasc and metoprolol. Will hold Cozaar due to AKI  4-prediabetes: Hemoglobin A1c 6.3. She takes glipizide at home. She will follow-up with her PCP on Monday to consider changing glipizide for empagliflozin.  5-CKD stage IIIa: Creatinine increased to 1.5 post cath Discussed with Dr. Gwenlyn Found, plan to start IV  fluids for 1214 hrs.  And observe overnight  6-hypothyroidism: Continue with Synthroid 7-depression: Continue with  Wellbutrin  Estimated body mass index is 34.26 kg/m as calculated from the following:   Height as of this encounter: 4\' 10"  (1.473 m).   Weight as of this encounter: 74.3 kg.   DVT prophylaxis: Lovenox Code Status: Full code Family Communication: Son over phone Disposition Plan:  Status is: Inpatient  Remains inpatient appropriate because:IV treatments appropriate due to intensity of illness or inability to take PO   Dispo: The patient is from: Home              Anticipated d/c is to: Home              Anticipated d/c date is: 1 day              Patient currently is not medically stable to d/c.   Difficult to place patient No        Consultants:   Cardiology   Procedures:   Cath 2/17  Antimicrobials:    Subjective: She is feeling better. Chest pain free.    Objective: Vitals:   09/18/20 0336 09/18/20 0809 09/18/20 1202 09/18/20 1529  BP: 125/61 (!) 162/86 (!) 150/81 (!) 146/65  Pulse: 72   76  Resp: 18   19  Temp: 98.2 F (36.8 C)   (!) 97.4 F (36.3 C)  TempSrc: Oral   Oral  SpO2: 99%   98%  Weight: 74.3 kg     Height:        Intake/Output Summary (Last 24 hours) at 09/18/2020 1549 Last data filed  at 09/17/2020 1930 Gross per 24 hour  Intake 240 ml  Output 1800 ml  Net -1560 ml   Filed Weights   09/16/20 0434 09/18/20 0336  Weight: 72.6 kg 74.3 kg    Examination:  General exam: Appears calm and comfortable  Respiratory system: Clear to auscultation. Respiratory effort normal. Cardiovascular system: S1 & S2 heard, RRR. No JVD, murmurs, rubs, gallops or clicks. No pedal edema. Gastrointestinal system: Abdomen is nondistended, soft and nontender. No organomegaly or masses felt. Normal bowel sounds heard. Central nervous system: Alert and oriented. No focal neurological deficits. Extremities: Symmetric 5 x 5 power. Skin: No  rashes, lesions or ulcers   Data Reviewed: I have personally reviewed following labs and imaging studies  CBC: Recent Labs  Lab 09/16/20 0431 09/17/20 0427 09/18/20 0140  WBC 8.9 8.0 10.1  NEUTROABS 6.3  --   --   HGB 16.0* 13.8 14.2  HCT 48.5* 39.4 41.9  MCV 91.9 90.2 89.9  PLT 329 273 956   Basic Metabolic Panel: Recent Labs  Lab 09/16/20 0431 09/18/20 0140 09/18/20 0942  NA 139 139 137  K 5.0 3.7 4.0  CL 106 104 102  CO2 22 24 22   GLUCOSE 156* 134* 247*  BUN 31* 24* 19  CREATININE 1.30* 1.44* 1.55*  CALCIUM 9.5 9.0 9.5   GFR: Estimated Creatinine Clearance: 26.9 mL/min (A) (by C-G formula based on SCr of 1.55 mg/dL (H)). Liver Function Tests: Recent Labs  Lab 09/16/20 0431  AST 37  ALT 36  ALKPHOS 80  BILITOT 1.3*  PROT 7.3  ALBUMIN 4.1   No results for input(s): LIPASE, AMYLASE in the last 168 hours. No results for input(s): AMMONIA in the last 168 hours. Coagulation Profile: Recent Labs  Lab 09/16/20 0431  INR 1.0   Cardiac Enzymes: No results for input(s): CKTOTAL, CKMB, CKMBINDEX, TROPONINI in the last 168 hours. BNP (last 3 results) No results for input(s): PROBNP in the last 8760 hours. HbA1C: Recent Labs    09/16/20 0431  HGBA1C 6.3*   CBG: Recent Labs  Lab 09/17/20 1058 09/17/20 1634 09/17/20 2231 09/18/20 0737 09/18/20 1159  GLUCAP 108* 164* 108* 155* 98   Lipid Profile: Recent Labs    09/16/20 0431  CHOL 157  HDL 45  LDLCALC 79  TRIG 167*  CHOLHDL 3.5   Thyroid Function Tests: Recent Labs    09/16/20 1218  TSH 4.189   Anemia Panel: No results for input(s): VITAMINB12, FOLATE, FERRITIN, TIBC, IRON, RETICCTPCT in the last 72 hours. Sepsis Labs: No results for input(s): PROCALCITON, LATICACIDVEN in the last 168 hours.  Recent Results (from the past 240 hour(s))  Resp Panel by RT-PCR (Flu A&B, Covid) Nasopharyngeal Swab     Status: None   Collection Time: 09/16/20  4:40 AM   Specimen: Nasopharyngeal Swab;  Nasopharyngeal(NP) swabs in vial transport medium  Result Value Ref Range Status   SARS Coronavirus 2 by RT PCR NEGATIVE NEGATIVE Final    Comment: (NOTE) SARS-CoV-2 target nucleic acids are NOT DETECTED.  The SARS-CoV-2 RNA is generally detectable in upper respiratory specimens during the acute phase of infection. The lowest concentration of SARS-CoV-2 viral copies this assay can detect is 138 copies/mL. A negative result does not preclude SARS-Cov-2 infection and should not be used as the sole basis for treatment or other patient management decisions. A negative result may occur with  improper specimen collection/handling, submission of specimen other than nasopharyngeal swab, presence of viral mutation(s) within the areas targeted by this  assay, and inadequate number of viral copies(<138 copies/mL). A negative result must be combined with clinical observations, patient history, and epidemiological information. The expected result is Negative.  Fact Sheet for Patients:  EntrepreneurPulse.com.au  Fact Sheet for Healthcare Providers:  IncredibleEmployment.be  This test is no t yet approved or cleared by the [place] FDA and  has been authorized for detection and/or diagnosis of SARS-CoV-2 by FDA under an Emergency Use Authorization (EUA). This EUA will remain  in effect (meaning this test can be used) for the duration of the COVID-19 declaration under Section 564(b)(1) of the Act, 21 U.S.C.section 360bbb-3(b)(1), unless the authorization is terminated  or revoked sooner.       Influenza A by PCR NEGATIVE NEGATIVE Final   Influenza B by PCR NEGATIVE NEGATIVE Final    Comment: (NOTE) The Xpert Xpress SARS-CoV-2/FLU/RSV plus assay is intended as an aid in the diagnosis of influenza from Nasopharyngeal swab specimens and should not be used as a sole basis for treatment. Nasal washings and aspirates are unacceptable for Xpert Xpress  SARS-CoV-2/FLU/RSV testing.  Fact Sheet for Patients: EntrepreneurPulse.com.au  Fact Sheet for Healthcare Providers: IncredibleEmployment.be  This test is not yet approved or cleared by the [place] FDA and has been authorized for detection and/or diagnosis of SARS-CoV-2 by FDA under an Emergency Use Authorization (EUA). This EUA will remain in effect (meaning this test can be used) for the duration of the COVID-19 declaration under Section 564(b)(1) of the Act, 21 U.S.C. section 360bbb-3(b)(1), unless the authorization is terminated or revoked.  Performed at [facility], [place] [address], [place], [place] [address]          Radiology Studies: CARDIAC CATHETERIZATION  Result Date: 09/17/2020  Prox LAD to Mid LAD lesion is 99% stenosed.  Post intervention, there is a 0% residual stenosis.  A drug-eluting stent was successfully placed using a STENT RESOLUTE ONYX 3.0X18.  A drug-eluting stent was successfully placed using a STENT RESOLUTE ONYX 3.0X8.  The left ventricular systolic function is normal.  LV end diastolic pressure is moderately elevated.  The left ventricular ejection fraction is 50-55% by visual estimate.  1. Single vessel occlusive CAD. 99% mid LAD with TIMI 1 flow. 2. Inferoapical HK with low normal EF 3. Moderately elevated LVEDP 4. Successful PCI of the mid LAD with OCT guidance and DES x 2. Plan: DAPT for one year. Risk factor modification. Anticipate DC in am.        Scheduled Meds: . amLODipine  10 mg Oral Daily  . aspirin EC  81 mg Oral Daily  . atorvastatin  40 mg Oral QHS  . buPROPion  300 mg Oral Daily  . enoxaparin (LOVENOX) injection  40 mg Subcutaneous Q24H  . insulin aspart  0-15 Units Subcutaneous TID WC  . insulin aspart  0-5 Units Subcutaneous QHS  . levothyroxine  50 mcg Oral QAC breakfast  . metoprolol tartrate  100 mg Oral BID  . polyethylene glycol  17 g Oral Daily  . sodium chloride  flush  3 mL Intravenous Q12H  . ticagrelor  90 mg Oral BID   Continuous Infusions: . sodium chloride    . sodium chloride 75 mL/hr at 09/18/20 1159     LOS: 2 days    Time spent: 35 minutes    Majed Pellegrin A Berdie Malter, MD [facility]   If 7PM-7AM, please contact night-coverage [email or web address]  09/18/2020, 3:49 PM

## 2020-09-18 NOTE — Research (Signed)
Mrs. Wickstrom is very interested in participating in the SOS-AMI trial (Selatogrel vs Placebo autoinjector). We will make her an appointment to come in after her discharge to sign her consent and have her autoinjector training. I will call her next week and coordinate this appointment with her other postop appointments. I have notified Dr. Harrell Gave.

## 2020-09-18 NOTE — Progress Notes (Signed)
CARDIAC REHAB PHASE I   PRE:  Rate/Rhythm: 68 SR  BP:  Sitting: 155/73 (leg)    SaO2: 95 RA  MODE:  Ambulation: 240 ft   POST:  Rate/Rhythm: 93 SR  BP:  Sitting:164/75    SaO2: 95 RA  Pt tolerated walking well with no assistive device and stand by assist. Pt Amb 240 with no c/o CP, DOE or pain. Pt could have gone further, but was having stomach upset and cramps from laxatives and wanted to stay close to room. Pt returned to bed with call bell and started education. Educated pt on brilinta, ASA for stent, heart health diet and diabetic diet, MI restrictions, NTG use, walking for exercise at home, referral placed for [facility] CPR-2. Pt questions were answered and she verbalized understanding.    [id number] Jamie Benjamin ACSM-EP 09/18/2020 11:02 AM

## 2020-09-19 ENCOUNTER — Observation Stay (HOSPITAL_COMMUNITY): Payer: Medicare Other

## 2020-09-19 ENCOUNTER — Encounter (HOSPITAL_COMMUNITY): Payer: Self-pay | Admitting: Emergency Medicine

## 2020-09-19 ENCOUNTER — Emergency Department (HOSPITAL_COMMUNITY): Payer: Medicare Other

## 2020-09-19 ENCOUNTER — Inpatient Hospital Stay (HOSPITAL_COMMUNITY)
Admission: EM | Admit: 2020-09-19 | Discharge: 2020-09-21 | DRG: 064 | Disposition: A | Discharge status: Home / Self Care | Payer: Medicare Other | Attending: Internal Medicine | Admitting: Internal Medicine

## 2020-09-19 ENCOUNTER — Other Ambulatory Visit: Payer: Self-pay

## 2020-09-19 DIAGNOSIS — G9341 Metabolic encephalopathy: Secondary | ICD-10-CM | POA: Diagnosis present

## 2020-09-19 DIAGNOSIS — R7303 Prediabetes: Secondary | ICD-10-CM | POA: Diagnosis present

## 2020-09-19 DIAGNOSIS — R29700 NIHSS score 0: Secondary | ICD-10-CM | POA: Diagnosis present

## 2020-09-19 DIAGNOSIS — Z803 Family history of malignant neoplasm of breast: Secondary | ICD-10-CM

## 2020-09-19 DIAGNOSIS — I2510 Atherosclerotic heart disease of native coronary artery without angina pectoris: Secondary | ICD-10-CM | POA: Diagnosis not present

## 2020-09-19 DIAGNOSIS — I639 Cerebral infarction, unspecified: Secondary | ICD-10-CM | POA: Diagnosis not present

## 2020-09-19 DIAGNOSIS — R4701 Aphasia: Secondary | ICD-10-CM | POA: Diagnosis present

## 2020-09-19 DIAGNOSIS — Z79890 Hormone replacement therapy (postmenopausal): Secondary | ICD-10-CM

## 2020-09-19 DIAGNOSIS — I129 Hypertensive chronic kidney disease with stage 1 through stage 4 chronic kidney disease, or unspecified chronic kidney disease: Secondary | ICD-10-CM | POA: Diagnosis not present

## 2020-09-19 DIAGNOSIS — E66811 Obesity, class 1: Secondary | ICD-10-CM

## 2020-09-19 DIAGNOSIS — Z20822 Contact with and (suspected) exposure to covid-19: Secondary | ICD-10-CM | POA: Diagnosis present

## 2020-09-19 DIAGNOSIS — R404 Transient alteration of awareness: Secondary | ICD-10-CM

## 2020-09-19 DIAGNOSIS — Z79899 Other long term (current) drug therapy: Secondary | ICD-10-CM

## 2020-09-19 DIAGNOSIS — R410 Disorientation, unspecified: Secondary | ICD-10-CM | POA: Diagnosis present

## 2020-09-19 DIAGNOSIS — Z7982 Long term (current) use of aspirin: Secondary | ICD-10-CM

## 2020-09-19 DIAGNOSIS — R4182 Altered mental status, unspecified: Secondary | ICD-10-CM | POA: Diagnosis not present

## 2020-09-19 DIAGNOSIS — Z853 Personal history of malignant neoplasm of breast: Secondary | ICD-10-CM

## 2020-09-19 DIAGNOSIS — E6609 Other obesity due to excess calories: Secondary | ICD-10-CM | POA: Diagnosis present

## 2020-09-19 DIAGNOSIS — N183 Chronic kidney disease, stage 3 unspecified: Secondary | ICD-10-CM | POA: Diagnosis present

## 2020-09-19 DIAGNOSIS — Z955 Presence of coronary angioplasty implant and graft: Secondary | ICD-10-CM

## 2020-09-19 DIAGNOSIS — I10 Essential (primary) hypertension: Secondary | ICD-10-CM | POA: Diagnosis not present

## 2020-09-19 DIAGNOSIS — E785 Hyperlipidemia, unspecified: Secondary | ICD-10-CM | POA: Diagnosis present

## 2020-09-19 DIAGNOSIS — N1832 Chronic kidney disease, stage 3b: Secondary | ICD-10-CM | POA: Diagnosis not present

## 2020-09-19 DIAGNOSIS — N1831 Chronic kidney disease, stage 3a: Secondary | ICD-10-CM | POA: Diagnosis not present

## 2020-09-19 DIAGNOSIS — Z6833 Body mass index (BMI) 33.0-33.9, adult: Secondary | ICD-10-CM

## 2020-09-19 DIAGNOSIS — F32A Depression, unspecified: Secondary | ICD-10-CM | POA: Diagnosis present

## 2020-09-19 DIAGNOSIS — I252 Old myocardial infarction: Secondary | ICD-10-CM

## 2020-09-19 DIAGNOSIS — Z9013 Acquired absence of bilateral breasts and nipples: Secondary | ICD-10-CM

## 2020-09-19 DIAGNOSIS — Z9861 Coronary angioplasty status: Secondary | ICD-10-CM

## 2020-09-19 LAB — BASIC METABOLIC PANEL
Anion gap: 11 (ref 5–15)
BUN: 18 mg/dL (ref 8–23)
CO2: 23 mmol/L (ref 22–32)
Calcium: 9.2 mg/dL (ref 8.9–10.3)
Chloride: 109 mmol/L (ref 98–111)
Creatinine, Ser: 1.26 mg/dL — ABNORMAL HIGH (ref 0.44–1.00)
GFR, Estimated: 45 mL/min — ABNORMAL LOW (ref >60)
Glucose, Bld: 106 mg/dL — ABNORMAL HIGH (ref 70–99)
Potassium: 3.8 mmol/L (ref 3.5–5.1)
Sodium: 143 mmol/L (ref 135–145)

## 2020-09-19 LAB — COMPREHENSIVE METABOLIC PANEL
ALT: 25 U/L (ref 0–44)
AST: 28 U/L (ref 15–41)
Albumin: 4 g/dL (ref 3.5–5.0)
Alkaline Phosphatase: 76 U/L (ref 38–126)
Anion gap: 13 (ref 5–15)
BUN: 25 mg/dL — ABNORMAL HIGH (ref 8–23)
CO2: 21 mmol/L — ABNORMAL LOW (ref 22–32)
Calcium: 10 mg/dL (ref 8.9–10.3)
Chloride: 104 mmol/L (ref 98–111)
Creatinine, Ser: 1.33 mg/dL — ABNORMAL HIGH (ref 0.44–1.00)
GFR, Estimated: 42 mL/min — ABNORMAL LOW (ref >60)
Glucose, Bld: 136 mg/dL — ABNORMAL HIGH (ref 70–99)
Potassium: 4.1 mmol/L (ref 3.5–5.1)
Sodium: 138 mmol/L (ref 135–145)
Total Bilirubin: 0.5 mg/dL (ref 0.3–1.2)
Total Protein: 7.3 g/dL (ref 6.5–8.1)

## 2020-09-19 LAB — DIFFERENTIAL
Abs Immature Granulocytes: 0.02 10*3/uL (ref 0.00–0.07)
Basophils Absolute: 0.1 10*3/uL (ref 0.0–0.1)
Basophils Relative: 1 %
Eosinophils Absolute: 0.2 10*3/uL (ref 0.0–0.5)
Eosinophils Relative: 2 %
Immature Granulocytes: 0 %
Lymphocytes Relative: 17 %
Lymphs Abs: 1.4 10*3/uL (ref 0.7–4.0)
Monocytes Absolute: 1.3 10*3/uL — ABNORMAL HIGH (ref 0.1–1.0)
Monocytes Relative: 15 %
Neutro Abs: 5.6 10*3/uL (ref 1.7–7.7)
Neutrophils Relative %: 65 %

## 2020-09-19 LAB — CBC
HCT: 44.3 % (ref 36.0–46.0)
Hemoglobin: 14.6 g/dL (ref 12.0–15.0)
MCH: 30.4 pg (ref 26.0–34.0)
MCHC: 33 g/dL (ref 30.0–36.0)
MCV: 92.1 fL (ref 80.0–100.0)
Platelets: 278 10*3/uL (ref 150–400)
RBC: 4.81 MIL/uL (ref 3.87–5.11)
RDW: 13.2 % (ref 11.5–15.5)
WBC: 8.5 10*3/uL (ref 4.0–10.5)
nRBC: 0 % (ref 0.0–0.2)

## 2020-09-19 LAB — APTT: aPTT: 36 seconds (ref 24–36)

## 2020-09-19 LAB — PROTIME-INR
INR: 1 (ref 0.8–1.2)
Prothrombin Time: 12.7 seconds (ref 11.4–15.2)

## 2020-09-19 LAB — GLUCOSE, CAPILLARY: Glucose-Capillary: 97 mg/dL (ref 70–99)

## 2020-09-19 LAB — CBG MONITORING, ED: Glucose-Capillary: 122 mg/dL — ABNORMAL HIGH (ref 70–99)

## 2020-09-19 MED ORDER — PRAVASTATIN SODIUM 40 MG PO TABS
40.0000 mg | ORAL_TABLET | Freq: Every day | ORAL | Status: DC
  Administered 2020-09-19: 40 mg via ORAL
  Filled 2020-09-19: qty 1

## 2020-09-19 MED ORDER — ENOXAPARIN SODIUM 40 MG/0.4ML ~~LOC~~ SOLN
40.0000 mg | SUBCUTANEOUS | Status: DC
  Filled 2020-09-19 (×2): qty 0.4

## 2020-09-19 MED ORDER — ACETAMINOPHEN 325 MG PO TABS
650.0000 mg | ORAL_TABLET | Freq: Four times a day (QID) | ORAL | Status: DC | PRN
  Administered 2020-09-21: 650 mg via ORAL
  Filled 2020-09-19: qty 2

## 2020-09-19 MED ORDER — SODIUM CHLORIDE 0.9% FLUSH
3.0000 mL | Freq: Once | INTRAVENOUS | Status: AC
  Administered 2020-09-19: 3 mL via INTRAVENOUS

## 2020-09-19 MED ORDER — ACETAMINOPHEN 650 MG RE SUPP: 650.0000 mg | Freq: Four times a day (QID) | RECTAL | Status: DC | PRN

## 2020-09-19 MED ORDER — METOPROLOL TARTRATE 50 MG PO TABS
100.0000 mg | ORAL_TABLET | Freq: Two times a day (BID) | ORAL | Status: DC
  Administered 2020-09-19 – 2020-09-21 (×4): 100 mg via ORAL
  Filled 2020-09-19: qty 2
  Filled 2020-09-19 (×2): qty 4
  Filled 2020-09-19: qty 2

## 2020-09-19 MED ORDER — TICAGRELOR 90 MG PO TABS: 90.0000 mg | ORAL_TABLET | Freq: Two times a day (BID) | ORAL | 11 refills | Status: DC

## 2020-09-19 MED ORDER — BUPROPION HCL ER (XL) 150 MG PO TB24
300.0000 mg | ORAL_TABLET | Freq: Every day | ORAL | Status: DC
  Administered 2020-09-20 – 2020-09-21 (×2): 300 mg via ORAL
  Filled 2020-09-19 (×2): qty 2

## 2020-09-19 MED ORDER — VITAMIN D 25 MCG (1000 UNIT) PO TABS
2000.0000 [IU] | ORAL_TABLET | Freq: Every day | ORAL | Status: DC
  Administered 2020-09-20 – 2020-09-21 (×2): 2000 [IU] via ORAL
  Filled 2020-09-19 (×2): qty 2

## 2020-09-19 MED ORDER — LEVOTHYROXINE SODIUM 50 MCG PO TABS
50.0000 ug | ORAL_TABLET | Freq: Every day | ORAL | Status: DC
  Administered 2020-09-20 – 2020-09-21 (×2): 50 ug via ORAL
  Filled 2020-09-19 (×2): qty 1

## 2020-09-19 MED ORDER — ALBUTEROL SULFATE (2.5 MG/3ML) 0.083% IN NEBU: 2.5000 mg | INHALATION_SOLUTION | RESPIRATORY_TRACT | Status: DC | PRN

## 2020-09-19 MED ORDER — ONDANSETRON HCL 4 MG/2ML IJ SOLN: 4.0000 mg | Freq: Four times a day (QID) | INTRAMUSCULAR | Status: DC | PRN

## 2020-09-19 MED ORDER — LOSARTAN POTASSIUM 50 MG PO TABS: 50.0000 mg | ORAL_TABLET | Freq: Every day | ORAL | Status: DC

## 2020-09-19 MED ORDER — TICAGRELOR 90 MG PO TABS
90.0000 mg | ORAL_TABLET | Freq: Two times a day (BID) | ORAL | Status: DC
  Administered 2020-09-19 – 2020-09-21 (×4): 90 mg via ORAL
  Filled 2020-09-19 (×4): qty 1

## 2020-09-19 MED ORDER — MELATONIN 5 MG PO TABS
10.0000 mg | ORAL_TABLET | Freq: Every evening | ORAL | Status: DC | PRN
Start: 2020-09-19 — End: 2020-09-21
  Filled 2020-09-19: qty 2

## 2020-09-19 MED ORDER — ASPIRIN EC 81 MG PO TBEC
81.0000 mg | DELAYED_RELEASE_TABLET | Freq: Every day | ORAL | Status: DC
  Administered 2020-09-20 – 2020-09-21 (×2): 81 mg via ORAL
  Filled 2020-09-19 (×2): qty 1

## 2020-09-19 MED ORDER — AMLODIPINE BESYLATE 5 MG PO TABS: 10.0000 mg | ORAL_TABLET | Freq: Every day | ORAL | Status: DC

## 2020-09-19 MED ORDER — ONDANSETRON HCL 4 MG PO TABS: 4.0000 mg | ORAL_TABLET | Freq: Four times a day (QID) | ORAL | Status: DC | PRN

## 2020-09-19 NOTE — Plan of Care (Signed)
Problem: Education: Goal: Knowledge of General Education information will improve Description: Including pain rating scale, medication(s)/side effects and non-pharmacologic comfort measures 09/19/2020 0846 by Camillia Herter, RN Outcome: Adequate for Discharge 09/19/2020 0748 by Camillia Herter, RN Outcome: Progressing   Problem: Health Behavior/Discharge Planning: Goal: Ability to manage health-related needs will improve 09/19/2020 0846 by Camillia Herter, RN Outcome: Adequate for Discharge 09/19/2020 0748 by Camillia Herter, RN Outcome: Progressing   Problem: Clinical Measurements: Goal: Ability to maintain clinical measurements within normal limits will improve 09/19/2020 0846 by Camillia Herter, RN Outcome: Adequate for Discharge 09/19/2020 0748 by Camillia Herter, RN Outcome: Progressing Goal: Will remain free from infection 09/19/2020 0846 by Camillia Herter, RN Outcome: Adequate for Discharge 09/19/2020 0748 by Camillia Herter, RN Outcome: Progressing Goal: Diagnostic test results will improve 09/19/2020 0846 by Camillia Herter, RN Outcome: Adequate for Discharge 09/19/2020 0748 by Camillia Herter, RN Outcome: Progressing Goal: Respiratory complications will improve 09/19/2020 0846 by Camillia Herter, RN Outcome: Adequate for Discharge 09/19/2020 0748 by Camillia Herter, RN Outcome: Progressing Goal: Cardiovascular complication will be avoided 09/19/2020 0846 by Camillia Herter, RN Outcome: Adequate for Discharge 09/19/2020 0748 by Camillia Herter, RN Outcome: Progressing   Problem: Activity: Goal: Risk for activity intolerance will decrease 09/19/2020 0846 by Camillia Herter, RN Outcome: Adequate for Discharge 09/19/2020 0748 by Camillia Herter, RN Outcome: Progressing   Problem: Nutrition: Goal: Adequate nutrition will be maintained 09/19/2020 0846 by Camillia Herter, RN Outcome: Adequate for Discharge 09/19/2020 0748 by Camillia Herter, RN Outcome: Progressing   Problem:  Coping: Goal: Level of anxiety will decrease 09/19/2020 0846 by Camillia Herter, RN Outcome: Adequate for Discharge 09/19/2020 0748 by Camillia Herter, RN Outcome: Progressing   Problem: Elimination: Goal: Will not experience complications related to bowel motility 09/19/2020 0846 by Camillia Herter, RN Outcome: Adequate for Discharge 09/19/2020 0748 by Camillia Herter, RN Outcome: Progressing Goal: Will not experience complications related to urinary retention 09/19/2020 0846 by Camillia Herter, RN Outcome: Adequate for Discharge 09/19/2020 0748 by Camillia Herter, RN Outcome: Progressing   Problem: Pain Managment: Goal: General experience of comfort will improve 09/19/2020 0846 by Camillia Herter, RN Outcome: Adequate for Discharge 09/19/2020 0748 by Camillia Herter, RN Outcome: Progressing   Problem: Safety: Goal: Ability to remain free from injury will improve 09/19/2020 0846 by Camillia Herter, RN Outcome: Adequate for Discharge 09/19/2020 0748 by Camillia Herter, RN Outcome: Progressing   Problem: Skin Integrity: Goal: Risk for impaired skin integrity will decrease 09/19/2020 0846 by Camillia Herter, RN Outcome: Adequate for Discharge 09/19/2020 0748 by Camillia Herter, RN Outcome: Progressing   Problem: Education: Goal: Understanding of CV disease, CV risk reduction, and recovery process will improve 09/19/2020 0846 by Camillia Herter, RN Outcome: Adequate for Discharge 09/19/2020 0748 by Camillia Herter, RN Outcome: Progressing Goal: Individualized Educational Video(s) 09/19/2020 0846 by Camillia Herter, RN Outcome: Adequate for Discharge 09/19/2020 0748 by Camillia Herter, RN Outcome: Progressing   Problem: Activity: Goal: Ability to return to baseline activity level will improve 09/19/2020 0846 by Camillia Herter, RN Outcome: Adequate for Discharge 09/19/2020 0748 by Camillia Herter, RN Outcome: Progressing   Problem: Cardiovascular: Goal: Ability to achieve and maintain adequate  cardiovascular perfusion will improve 09/19/2020 0846 by Camillia Herter, RN Outcome: Adequate for Discharge 09/19/2020 0748 by Camillia Herter, RN Outcome: Progressing Goal: Vascular access  site(s) Level 0-1 will be maintained 09/19/2020 0846 by Camillia Herter, RN Outcome: Adequate for Discharge 09/19/2020 0748 by Camillia Herter, RN Outcome: Progressing   Problem: Health Behavior/Discharge Planning: Goal: Ability to safely manage health-related needs after discharge will improve 09/19/2020 0846 by Camillia Herter, RN Outcome: Adequate for Discharge 09/19/2020 0748 by Camillia Herter, RN Outcome: Progressing

## 2020-09-19 NOTE — TOC Transition Note (Signed)
Transition of Care [facility]) - CM/SW Discharge Note   Patient Details  Name: MORNINGSTAR TOFT MRN: [id number] Date of Birth: [birth year]  [facility] CM/SW Contact:  Zenon Mayo, RN Phone Number: 09/19/2020, 9:08 AM   Clinical Narrative:    NCM spoke with patient at bedside, brought up the 30 day brilinta coupon for her, she states she has one already and they filled it for her.  She has no other needs.   Final next level of care: Home/Self Care Barriers to Discharge: No Barriers Identified   Patient Goals and CMS Choice        Discharge Placement                       Discharge Plan and Services                                     Social Determinants of Health (SDOH) Interventions     Readmission Risk Interventions No flowsheet data found.

## 2020-09-19 NOTE — ED Notes (Signed)
Pt given warm blanket.

## 2020-09-19 NOTE — Discharge Summary (Signed)
Physician Discharge Summary  EMILYN RUBLE CWC:[id number] DOB: April 16, 1946 DOA: 09/16/2020  PCP: Maurice Small, MD  Admit date: 09/16/2020 Discharge date: 09/19/2020  Admitted From: Home  Disposition:  Home   Recommendations for Outpatient Follow-up:  1. Follow up with PCP in 1-2 weeks 2. Please obtain BMP/CBC in one week 3. Needs to follow up with PCP to discuss changing glipizide to Jardiance. 4. Follow up with Dr Gwenlyn Found to discuss starting zetia vs Repatha.  5. Follow up with cardiac Rehab.    Home Health: None  Discharge Condition: Stable.  CODE STATUS: Full Code Diet recommendation: Heart Healthy    Brief/Interim Summary: 75 year old with past medical history significant for hypothyroidism, obesity, hypertension, hyperlipidemia, remote history of breast cancer, depression who presented to the emergency department with complaints of chest pain.  Chest pain started acutely, retrosternal, radiating into her throat with associated with right arm numbness.  She was complaining of severe chest discomfort on presentation.  Troponin was mildly elevated.  CTA was negative for dissection.  She was a started on heparin drip and nitroglycerin drip for non-STEMI.  Underwent cardiac cath which showed critical LAD stenosis 99%, status post stent placement.     1-non-STEMI acute coronary syndrome: Patient presented with chest pain, troponin increased up to 700. Initially she was treated with IV nitroglycerin drip, heparin drip. Underwent cardiac cath which showed critical LAD stenosis of 99%.  Status post DES placement x2. Cardiology recommend DAPT for a year Plan to follow up with cardiac Rehab.   2-HLD; : LDL 78. Patient is not able to tolerate Lipitor.  Plan to resume home pravastatin.  She will follow up with Dr Gwenlyn Found to discuss Zetia and or PCSK9    3-Hypertension: Continue with Norvasc and metoprolol. Resume cozaar, renal function normalized.   4-prediabetes: Hemoglobin A1c  6.3. She takes glipizide at home. She will follow-up with her PCP on Monday to consider changing glipizide for empagliflozin.   5-CKD stage IIIa: Creatinine increased to 1.5 post cath Discussed with Dr. Gwenlyn Found, plan to hydrate with IV fluids.  Cr this am down to 1.2.  Stable for discharge.   6-hypothyroidism: Continue with Synthroid 7-depression: Continue with  Wellbutrin   Discharge Diagnoses:  Principal Problem:   ACS (acute coronary syndrome) (HCC) Active Problems:   Hyperlipidemia   Hypertension   Hypothyroidism   Major depressive disorder with current active episode   Class 1 obesity due to excess calories with body mass index (BMI) of 33.0 to 33.9 in adult   Prediabetes   Stage 3a chronic kidney disease ([place])    Discharge Instructions  Discharge Instructions    AMB Referral to [facility]   Complete by: As directed    Reason For Referral: Lipids   Amb Referral to Cardiac Rehabilitation   Complete by: As directed    Diagnosis:  NSTEMI Coronary Stents     After initial evaluation and assessments completed: Virtual Based Care may be provided alone or in conjunction with Phase 2 Cardiac Rehab based on patient barriers.: Yes   Diet - low sodium heart healthy   Complete by: As directed    Increase activity slowly   Complete by: As directed      Allergies as of 09/19/2020      Reactions   Influenza Virus Vaccine Other (See Comments)      Medication List    TAKE these medications   amLODipine 10 MG tablet Commonly known as: NORVASC Take 10 mg by mouth daily.  aspirin EC 81 MG tablet Take 81 mg by mouth daily.   buPROPion 300 MG 24 hr tablet Commonly known as: WELLBUTRIN XL Take 300 mg by mouth daily.   cholecalciferol 1000 units tablet Commonly known as: VITAMIN D Take 2,000 Units by mouth daily.   glipiZIDE 2.5 MG 24 hr tablet Commonly known as: GLUCOTROL XL Take 2.5 mg by mouth daily with breakfast.   levothyroxine 50 MCG  tablet Commonly known as: SYNTHROID Take 50 mcg by mouth daily before breakfast.   losartan 50 MG tablet Commonly known as: COZAAR Take 50 mg by mouth daily.   Melatonin 10 MG Tabs Take by mouth at bedtime as needed.   metoprolol tartrate 100 MG tablet Commonly known as: LOPRESSOR Take 100 mg by mouth 2 (two) times daily.   pravastatin 40 MG tablet Commonly known as: PRAVACHOL Take 40 mg by mouth at bedtime.   ticagrelor 90 MG Tabs tablet Commonly known as: BRILINTA Take 1 tablet (90 mg total) by mouth 2 (two) times daily.       Follow-up Information    Erlene Quan, PA-C Follow up on 10/05/2020.   Specialties: Cardiology, Radiology Why: at 9:45am  Contact information: [address] [place] [address] [phone]        Maurice Small, MD Follow up in 1 week(s).   Specialty: Family Medicine Why: keep your appointment.  Contact information: [address] [place] [address] [phone]              Allergies  Allergen Reactions  . Influenza Virus Vaccine Other (See Comments)    Consultations:  Cardiology    Procedures/Studies: CARDIAC CATHETERIZATION  Result Date: 09/17/2020  Prox LAD to Mid LAD lesion is 99% stenosed.  Post intervention, there is a 0% residual stenosis.  A drug-eluting stent was successfully placed using a STENT RESOLUTE ONYX 3.0X18.  A drug-eluting stent was successfully placed using a STENT RESOLUTE ONYX 3.0X8.  The left ventricular systolic function is normal.  LV end diastolic pressure is moderately elevated.  The left ventricular ejection fraction is 50-55% by visual estimate.  1. Single vessel occlusive CAD. 99% mid LAD with TIMI 1 flow. 2. Inferoapical HK with low normal EF 3. Moderately elevated LVEDP 4. Successful PCI of the mid LAD with OCT guidance and DES x 2. Plan: DAPT for one year. Risk factor modification. Anticipate DC in am.   DG Chest Port 1 View  Result Date:  09/16/2020 CLINICAL DATA:  Chest pain EXAM: PORTABLE CHEST 1 VIEW COMPARISON:  None. FINDINGS: Borderline heart size accentuated by apical fat pad, confirmed by 2021 abdominal CT. Unremarkable mediastinal contours for technique. There is no edema, consolidation, effusion, or pneumothorax. Artifact from EKG leads. IMPRESSION: No acute finding. Electronically Signed   By: Monte Fantasia M.D.   On: 09/16/2020 05:07   ECHOCARDIOGRAM COMPLETE  Result Date: 09/16/2020    ECHOCARDIOGRAM REPORT   Patient Name:   SHAKEETA GODETTE Date of Exam: 09/16/2020 Medical Rec #:  [id number]       Height:       58.0 in Accession #:    [id number]      Weight:       160.0 lb Date of Birth:  January 13, 1946      BSA:          1.656 m Patient Age:    75 years        BP:  121/95 mmHg Patient Gender: F               HR:           66 bpm. Exam Location:  Inpatient Procedure: 2D Echo, Color Doppler and Cardiac Doppler Indications:    R07.9* Chest pain, unspecified  History:        Patient has prior history of Echocardiogram examinations, most                 recent 10/24/2017. Risk Factors:Hypertension and Dyslipidemia.  Sonographer:    Raquel Sarna Senior RDCS Referring Phys: [id number] JENNIFER YATES  Sonographer Comments: Technically difficult due to mastectomy. IMPRESSIONS  1. Technically difficult study. Left ventricular ejection fraction, by estimation, is 50 to 55%. The left ventricle has grossly normal function. Left ventricular endocardial border not optimally defined to evaluate regional wall motion. Appears to be hypokinesis of mid to apical anteroseptum. Left ventricular diastolic parameters are consistent with Grade I diastolic dysfunction (impaired relaxation).  2. Right ventricular systolic function is mildly reduced. The right ventricular size is normal. Tricuspid regurgitation signal is inadequate for assessing PA pressure.  3. The mitral valve is normal in structure. No evidence of mitral valve regurgitation.  4. The aortic valve is  tricuspid. Aortic valve regurgitation is not visualized. No aortic stenosis is present.  5. The inferior vena cava is normal in size with greater than 50% respiratory variability, suggesting right atrial pressure of 3 mmHg. FINDINGS  Left Ventricle: Left ventricular ejection fraction, by estimation, is 50 to 55%. The left ventricle has low normal function. Left ventricular endocardial border not optimally defined to evaluate regional wall motion. The left ventricular internal cavity  size was normal in size. There is no left ventricular hypertrophy. Left ventricular diastolic parameters are consistent with Grade I diastolic dysfunction (impaired relaxation). Right Ventricle: The right ventricular size is normal. Right vetricular wall thickness was not well visualized. Right ventricular systolic function is mildly reduced. Tricuspid regurgitation signal is inadequate for assessing PA pressure. Left Atrium: Left atrial size was normal in size. Right Atrium: Right atrial size was normal in size. Pericardium: There is no evidence of pericardial effusion. Mitral Valve: The mitral valve is normal in structure. No evidence of mitral valve regurgitation. Tricuspid Valve: The tricuspid valve is normal in structure. Tricuspid valve regurgitation is not demonstrated. Aortic Valve: The aortic valve is tricuspid. Aortic valve regurgitation is not visualized. No aortic stenosis is present. Pulmonic Valve: The pulmonic valve was not well visualized. Pulmonic valve regurgitation is not visualized. Aorta: The aortic root and ascending aorta are structurally normal, with no evidence of dilitation. Venous: The inferior vena cava is normal in size with greater than 50% respiratory variability, suggesting right atrial pressure of 3 mmHg. IAS/Shunts: The interatrial septum was not well visualized.  LEFT VENTRICLE PLAX 2D LVIDd:         4.20 cm  Diastology LVIDs:         2.70 cm  LV e' medial:    4.03 cm/s LV PW:         0.90 cm  LV E/e'  medial:  11.6 LV IVS:        0.80 cm  LV e' lateral:   4.35 cm/s LVOT diam:     1.80 cm  LV E/e' lateral: 10.7 LV SV:         42 LV SV Index:   26 LVOT Area:     2.54 cm  RIGHT VENTRICLE RV S prime:  9.25 cm/s TAPSE (M-mode): 1.5 cm LEFT ATRIUM             Index       RIGHT ATRIUM          Index LA diam:        3.50 cm 2.11 cm/m  RA Area:     8.47 cm LA Vol (A2C):   31.0 ml 18.72 ml/m RA Volume:   14.40 ml 8.69 ml/m LA Vol (A4C):   36.4 ml 21.98 ml/m LA Biplane Vol: 35.5 ml 21.43 ml/m  AORTIC VALVE LVOT Vmax:   74.40 cm/s LVOT Vmean:  59.200 cm/s LVOT VTI:    0.166 m  AORTA Ao Root diam: 2.40 cm Ao Asc diam:  2.80 cm MITRAL VALVE MV Area (PHT): 1.46 cm    SHUNTS MV Decel Time: 518 msec    Systemic VTI:  0.17 m MV E velocity: 46.70 cm/s  Systemic Diam: 1.80 cm MV A velocity: 81.80 cm/s MV E/A ratio:  0.57 Oswaldo Milian MD Electronically signed by Oswaldo Milian MD Signature Date/Time: 09/16/2020/3:40:32 PM    Final    CT Angio Chest/Abd/Pel for Dissection W and/or W/WO  Result Date: 09/16/2020 CLINICAL DATA:  Chest or back pain with aortic dissection suspected EXAM: CT ANGIOGRAPHY CHEST, ABDOMEN AND PELVIS TECHNIQUE: Non-contrast CT of the chest was initially obtained. Multidetector CT imaging through the chest, abdomen and pelvis was performed using the standard protocol during bolus administration of intravenous contrast. Multiplanar reconstructed images and MIPs were obtained and reviewed to evaluate the vascular anatomy. CONTRAST:  66mL OMNIPAQUE IOHEXOL 350 MG/ML SOLN COMPARISON:  None. FINDINGS: CTA CHEST FINDINGS Cardiovascular: Normal heart size. No pericardial effusion. No intramural aortic hematoma. Aortic and coronary atheromatous calcification. Negative for aortic dissection or aneurysm. Well opacified pulmonary arteries that are negative for filling defect Mediastinum/Nodes: Negative for adenopathy, mass, or hematoma. Lungs/Pleura: Areas of bronchial airway collapse/thickening.  There is no edema, consolidation, effusion, or pneumothorax. Musculoskeletal: Spondylosis with multilevel thoracic ankylosis from bridging osteophyte. Remote T12 compression fracture with mild height loss. Review of the MIP images confirms the above findings. CTA ABDOMEN AND PELVIS FINDINGS VASCULAR Aorta: Atheromatous plaque diffusely.  No aneurysm or dissection. Celiac: Patent without evidence of aneurysm, dissection, vasculitis or significant stenosis. SMA: Patent without evidence of aneurysm, dissection, vasculitis or significant stenosis. Renals: Plaque at the ostia without stenosis, beading, or dissection. Negative for aneurysm. IMA: Patent Inflow: Atheromatous plaque of the iliac arteries. No stenosis or dissection. Veins: Negative Review of the MIP images confirms the above findings. NON-VASCULAR Hepatobiliary: Low-density of the liver may be related to contrast timing. Negative for visible liver lesion.No evidence of biliary obstruction or stone. Pancreas: Unremarkable. Spleen: Unremarkable. Adrenals/Urinary Tract: Negative adrenals. No hydronephrosis or stone. Bilateral renal cysts with simple CT appearance. Unremarkable bladder. Stomach/Bowel: No obstruction. No visible bowel inflammation. Innumerable colonic diverticula. Lymphatic: No mass or adenopathy. Reproductive:No pathologic findings. Other: No ascites or pneumoperitoneum. Musculoskeletal: Lumbar spine degeneration with L4-5 anterolisthesis. No acute finding. Review of the MIP images confirms the above findings. IMPRESSION: 1. Negative for acute aortic syndrome or other specific cause of symptoms. 2.  Aortic Atherosclerosis (ICD10-I70.0).  Coronary atherosclerosis. 3. 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 4. Extensive colonic diverticulosis. Electronically Signed   By: Monte Fantasia M.D.   On: 09/16/2020 09:29    Subjective: She is chest pain free.  We talk about Plant base diet. Whole wheat   Discharge Exam: Vitals:    09/18/20 1933 09/19/20 0554  BP: (!) 175/69  125/70  Pulse: 84 75  Resp: 17 15  Temp: 98.6 F (37 C) 97.7 F (36.5 C)  SpO2: 100% 98%     General: Pt is alert, awake, not in acute distress Cardiovascular: RRR, S1/S2 +, no rubs, no gallops Respiratory: CTA bilaterally, no wheezing, no rhonchi Abdominal: Soft, NT, ND, bowel sounds + Extremities: no edema, no cyanosis    The results of significant diagnostics from this hospitalization (including imaging, microbiology, ancillary and laboratory) are listed below for reference.     Microbiology: Recent Results (from the past 240 hour(s))  Resp Panel by RT-PCR (Flu A&B, Covid) Nasopharyngeal Swab     Status: None   Collection Time: 09/16/20  4:40 AM   Specimen: Nasopharyngeal Swab; Nasopharyngeal(NP) swabs in vial transport medium  Result Value Ref Range Status   SARS Coronavirus 2 by RT PCR NEGATIVE NEGATIVE Final    Comment: (NOTE) SARS-CoV-2 target nucleic acids are NOT DETECTED.  The SARS-CoV-2 RNA is generally detectable in upper respiratory specimens during the acute phase of infection. The lowest concentration of SARS-CoV-2 viral copies this assay can detect is 138 copies/mL. A negative result does not preclude SARS-Cov-2 infection and should not be used as the sole basis for treatment or other patient management decisions. A negative result may occur with  improper specimen collection/handling, submission of specimen other than nasopharyngeal swab, presence of viral mutation(s) within the areas targeted by this assay, and inadequate number of viral copies(<138 copies/mL). A negative result must be combined with clinical observations, patient history, and epidemiological information. The expected result is Negative.  Fact Sheet for Patients:  EntrepreneurPulse.com.au  Fact Sheet for Healthcare Providers:  IncredibleEmployment.be  This test is no t yet approved or cleared by the  [place] FDA and  has been authorized for detection and/or diagnosis of SARS-CoV-2 by FDA under an Emergency Use Authorization (EUA). This EUA will remain  in effect (meaning this test can be used) for the duration of the COVID-19 declaration under Section 564(b)(1) of the Act, 21 U.S.C.section 360bbb-3(b)(1), unless the authorization is terminated  or revoked sooner.       Influenza A by PCR NEGATIVE NEGATIVE Final   Influenza B by PCR NEGATIVE NEGATIVE Final    Comment: (NOTE) The Xpert Xpress SARS-CoV-2/FLU/RSV plus assay is intended as an aid in the diagnosis of influenza from Nasopharyngeal swab specimens and should not be used as a sole basis for treatment. Nasal washings and aspirates are unacceptable for Xpert Xpress SARS-CoV-2/FLU/RSV testing.  Fact Sheet for Patients: EntrepreneurPulse.com.au  Fact Sheet for Healthcare Providers: IncredibleEmployment.be  This test is not yet approved or cleared by the [place] FDA and has been authorized for detection and/or diagnosis of SARS-CoV-2 by FDA under an Emergency Use Authorization (EUA). This EUA will remain in effect (meaning this test can be used) for the duration of the COVID-19 declaration under Section 564(b)(1) of the Act, 21 U.S.C. section 360bbb-3(b)(1), unless the authorization is terminated or revoked.  Performed at [facility], [place] [address] [place], [place] [address]      Labs: BNP (last 3 results) No results for input(s): BNP in the last 8760 hours. Basic Metabolic Panel: Recent Labs  Lab 09/16/20 0431 09/18/20 0140 09/18/20 0942 09/19/20 0307  NA 139 139 137 143  K 5.0 3.7 4.0 3.8  CL 106 104 102 109  CO2 22 24 22 23   GLUCOSE 156* 134* 247* 106*  BUN 31* 24* 19 18  CREATININE 1.30* 1.44* 1.55* 1.26*  CALCIUM 9.5 9.0 9.5 9.2   Liver Function Tests: Recent Labs  Lab 09/16/20 0431  AST 37  ALT 36  ALKPHOS 80  BILITOT 1.3*  PROT 7.3   ALBUMIN 4.1   No results for input(s): LIPASE, AMYLASE in the last 168 hours. No results for input(s): AMMONIA in the last 168 hours. CBC: Recent Labs  Lab 09/16/20 0431 09/17/20 0427 09/18/20 0140  WBC 8.9 8.0 10.1  NEUTROABS 6.3  --   --   HGB 16.0* 13.8 14.2  HCT 48.5* 39.4 41.9  MCV 91.9 90.2 89.9  PLT 329 273 265   Cardiac Enzymes: No results for input(s): CKTOTAL, CKMB, CKMBINDEX, TROPONINI in the last 168 hours. BNP: Invalid input(s): POCBNP CBG: Recent Labs  Lab 09/18/20 1159 09/18/20 1620 09/18/20 1746 09/18/20 2059 09/19/20 0806  GLUCAP 98 67* 106* 159* 97   D-Dimer No results for input(s): DDIMER in the last 72 hours. Hgb A1c No results for input(s): HGBA1C in the last 72 hours. Lipid Profile No results for input(s): CHOL, HDL, LDLCALC, TRIG, CHOLHDL, LDLDIRECT in the last 72 hours. Thyroid function studies No results for input(s): TSH, T4TOTAL, T3FREE, THYROIDAB in the last 72 hours.  Invalid input(s): FREET3 Anemia work up No results for input(s): VITAMINB12, FOLATE, FERRITIN, TIBC, IRON, RETICCTPCT in the last 72 hours. Urinalysis No results found for: COLORURINE, APPEARANCEUR, [place], [place], GLUCOSEU, [place], [place], [place], PROTEINUR, UROBILINOGEN, NITRITE, LEUKOCYTESUR Sepsis Labs Invalid input(s): PROCALCITONIN,  WBC,  LACTICIDVEN Microbiology Recent Results (from the past 240 hour(s))  Resp Panel by RT-PCR (Flu A&B, Covid) Nasopharyngeal Swab     Status: None   Collection Time: 09/16/20  4:40 AM   Specimen: Nasopharyngeal Swab; Nasopharyngeal(NP) swabs in vial transport medium  Result Value Ref Range Status   SARS Coronavirus 2 by RT PCR NEGATIVE NEGATIVE Final    Comment: (NOTE) SARS-CoV-2 target nucleic acids are NOT DETECTED.  The SARS-CoV-2 RNA is generally detectable in upper respiratory specimens during the acute phase of infection. The lowest concentration of SARS-CoV-2 viral copies this assay can detect is 138  copies/mL. A negative result does not preclude SARS-Cov-2 infection and should not be used as the sole basis for treatment or other patient management decisions. A negative result may occur with  improper specimen collection/handling, submission of specimen other than nasopharyngeal swab, presence of viral mutation(s) within the areas targeted by this assay, and inadequate number of viral copies(<138 copies/mL). A negative result must be combined with clinical observations, patient history, and epidemiological information. The expected result is Negative.  Fact Sheet for Patients:  EntrepreneurPulse.com.au  Fact Sheet for Healthcare Providers:  IncredibleEmployment.be  This test is no t yet approved or cleared by the [place] FDA and  has been authorized for detection and/or diagnosis of SARS-CoV-2 by FDA under an Emergency Use Authorization (EUA). This EUA will remain  in effect (meaning this test can be used) for the duration of the COVID-19 declaration under Section 564(b)(1) of the Act, 21 U.S.C.section 360bbb-3(b)(1), unless the authorization is terminated  or revoked sooner.       Influenza A by PCR NEGATIVE NEGATIVE Final   Influenza B by PCR NEGATIVE NEGATIVE Final    Comment: (NOTE) The Xpert Xpress SARS-CoV-2/FLU/RSV plus assay is intended as an aid in the diagnosis of influenza from Nasopharyngeal swab specimens and should not be used as a sole basis for treatment. Nasal washings and aspirates are unacceptable for Xpert Xpress SARS-CoV-2/FLU/RSV testing.  Fact Sheet for Patients: EntrepreneurPulse.com.au  Fact Sheet for  Healthcare Providers: IncredibleEmployment.be  This test is not yet approved or cleared by the [place] and has been authorized for detection and/or diagnosis of SARS-CoV-2 by FDA under an Emergency Use Authorization (EUA). This EUA will remain in effect (meaning  this test can be used) for the duration of the COVID-19 declaration under Section 564(b)(1) of the Act, 21 U.S.C. section 360bbb-3(b)(1), unless the authorization is terminated or revoked.  Performed at [facility], [place] [address] [place], [place] [address]      Time coordinating discharge: 40 minutes  SIGNED:   Elmarie Shiley, MD  [facility]

## 2020-09-19 NOTE — ED Provider Notes (Addendum)
[facility] EMERGENCY DEPARTMENT Provider Note   CSN: [id number] Arrival date & time: 09/19/20  1805     History Chief Complaint  Patient presents with  . Altered Mental Status    Jamie Benjamin is a 75 y.o. female.  Patient is a 75 year old female with a history of hypertension, hyperlipidemia, hypothyroidism, CKD stage III a, recent NSTEMI with stent placed in the LAD and discharged from the hospital yesterday who presents today with change in behavior. Patient reports the symptoms started when she was still hospitalized but became much more notable after getting home today. She reports that she has spells where she seems to go into a tunnel and not feel herself. Her son who is present reports that when he talk to her today she had lapses in memory and didn't sound herself but denied any aphasia, word salad or slurred speech. Patient states she has been having brief sharp headache in the right temporal area that last for seconds and comes and goes throughout the day. They are not necessarily directly associated with these episodes. She denies any hallucinations. She has had no chest pain, fever, cough, abdominal pain or vomiting. She denies any unilateral weakness or numbness. She has not noticed any visual changes. No prior history of stroke, atrial fibrillation. She has had an episode of delirium in the past when she was hospitalized for shoulder surgery and was using a scopolamine patch but nothing since that time. She also reports she did feel little off balance when she was walking that persisted throughout the day. While was talking to her she did report she had one of the episodes which was very brief but she just stared off into the distance for a few seconds and then ask what we were talking about.  The history is provided by the patient and a relative.  Altered Mental Status      Past Medical History:  Diagnosis Date  . Allergy    seasonal  . Arthritis   .  Breast cancer ([place]) 2005   left  . Depression   . Diverticulitis   . Hyperlipidemia   . Hypertension   . Hypothyroidism (acquired)   . IBS (irritable bowel syndrome)   . Obesity   . Prediabetes   . Stage 3a chronic kidney disease ([place]) 09/16/2020    Patient Active Problem List   Diagnosis Date Noted  . ACS (acute coronary syndrome) ([place]) 09/16/2020  . Stage 3a chronic kidney disease ([place]) 09/16/2020  . Breast cancer ([place]) 07/02/2019  . Diverticulitis 07/02/2019  . Hyperlipidemia 07/02/2019  . Hypertension 07/02/2019  . IBS (irritable bowel syndrome) 07/02/2019  . Hypothyroidism 07/02/2019  . Major depressive disorder with current active episode 07/02/2019  . Class 1 obesity due to excess calories with body mass index (BMI) of 33.0 to 33.9 in adult 07/02/2019  . Osteoporosis 07/02/2019  . Pernicious anemia 07/02/2019  . Prediabetes 07/02/2019  . Recurrent major depressive disorder, in partial remission ([facility]) 07/02/2019  . Sciatica of left side 07/02/2019    Past Surgical History:  Procedure Laterality Date  . COLONOSCOPY    . CORONARY STENT INTERVENTION N/A 09/17/2020   Procedure: CORONARY STENT INTERVENTION;  Surgeon: Martinique, Peter M, MD;  Location: [facility];  Service: Cardiovascular;  Laterality: N/A;  . INTRAVASCULAR IMAGING/OCT N/A 09/17/2020   Procedure: INTRAVASCULAR IMAGING/OCT;  Surgeon: Martinique, Peter M, MD;  Location: [facility];  Service: Cardiovascular;  Laterality: N/A;  . LEFT HEART CATH AND  CORONARY ANGIOGRAPHY N/A 09/17/2020   Procedure: LEFT HEART CATH AND CORONARY ANGIOGRAPHY;  Surgeon: Martinique, Peter M, MD;  Location: [facility];  Service: Cardiovascular;  Laterality: N/A;  . MASTECTOMY, RADICAL Bilateral 10 years ago  . ROTATOR CUFF REPAIR       OB History   No obstetric history on file.     Family History  Problem Relation Age of Onset  . Breast cancer Sister   . Ulcerative colitis Sister   . Colon cancer Neg Hx   . Colon  polyps Neg Hx   . Esophageal cancer Neg Hx   . Rectal cancer Neg Hx   . Stomach cancer Neg Hx     Social History   Tobacco Use  . Smoking status: Never Smoker  . Smokeless tobacco: Never Used  Vaping Use  . Vaping Use: Never used  Substance Use Topics  . Alcohol use: No  . Drug use: No    Home Medications Prior to Admission medications   Medication Sig Start Date End Date Taking? Authorizing Provider  amLODipine (NORVASC) 10 MG tablet Take 10 mg by mouth daily. 05/26/19   [provider]  aspirin EC 81 MG tablet Take 81 mg by mouth daily.    [provider]  buPROPion (WELLBUTRIN XL) 300 MG 24 hr tablet Take 300 mg by mouth daily.    [provider]  cholecalciferol (VITAMIN D) 1000 UNITS tablet Take 2,000 Units by mouth daily.    [provider]  glipiZIDE (GLUCOTROL XL) 2.5 MG 24 hr tablet Take 2.5 mg by mouth daily with breakfast.    [provider]  levothyroxine (SYNTHROID, LEVOTHROID) 50 MCG tablet Take 50 mcg by mouth daily before breakfast.    [provider]  losartan (COZAAR) 50 MG tablet Take 50 mg by mouth daily.    [provider]  Melatonin 10 MG TABS Take by mouth at bedtime as needed.    [provider]  metoprolol tartrate (LOPRESSOR) 100 MG tablet Take 100 mg by mouth 2 (two) times daily.    [provider]  pravastatin (PRAVACHOL) 40 MG tablet Take 40 mg by mouth at bedtime. 08/25/14   [provider]  ticagrelor (BRILINTA) 90 MG TABS tablet Take 1 tablet (90 mg total) by mouth 2 (two) times daily. 09/19/20   Regalado, Jerald Kief A, MD    Allergies    Influenza virus vaccine  Review of Systems   Review of Systems  All other systems reviewed and are negative.   Physical Exam Updated Vital Signs BP (!) 143/113   Pulse 86   Temp 98 F (36.7 C)   Resp 16   SpO2 99%   Physical Exam Vitals and nursing note reviewed.  Constitutional:      General: She is not in acute  distress.    Appearance: Normal appearance. She is well-developed and well-nourished.  HENT:     Head: Normocephalic and atraumatic.     Nose: Nose normal.  Eyes:     General: No visual field deficit.    Extraocular Movements: EOM normal.     Pupils: Pupils are equal, round, and reactive to light.  Neck:     Vascular: Carotid bruit present. No JVD.     Comments: Faint carotid bruit noted on the right Cardiovascular:     Rate and Rhythm: Normal rate and regular rhythm.     Pulses: Normal pulses and intact distal pulses.     Heart sounds: Normal  heart sounds. No murmur heard. No friction rub.  Pulmonary:     Effort: Pulmonary effort is normal.     Breath sounds: Normal breath sounds. No wheezing or rales.  Abdominal:     General: Bowel sounds are normal. There is no distension.     Palpations: Abdomen is soft.     Tenderness: There is no abdominal tenderness. There is no guarding or rebound.  Musculoskeletal:        General: No tenderness. Normal range of motion.     Cervical back: Normal range of motion and neck supple.     Comments: No edema.  Cath site in the right radial clean and dry bandage.  Healing ecchymosis.  Skin:    General: Skin is warm and dry.     Findings: No rash.  Neurological:     General: No focal deficit present.     Mental Status: She is alert and oriented to person, place, and time. Mental status is at baseline.     Cranial Nerves: No cranial nerve deficit or facial asymmetry.     Sensory: Sensation is intact. No sensory deficit.     Motor: No weakness or pronator drift.     Coordination: Coordination normal.     Gait: Gait is intact. Gait normal.  Psychiatric:        Mood and Affect: Mood and affect and mood normal.        Behavior: Behavior normal.        Thought Content: Thought content normal.     ED Results / Procedures / Treatments   Labs (all labs ordered are listed, but only abnormal results are displayed) Labs Reviewed  DIFFERENTIAL -  Abnormal; Notable for the following components:      Result Value   Monocytes Absolute 1.3 (*)    All other components within normal limits  COMPREHENSIVE METABOLIC PANEL - Abnormal; Notable for the following components:   CO2 21 (*)    Glucose, Bld 136 (*)    BUN 25 (*)    Creatinine, Ser 1.33 (*)    GFR, Estimated 42 (*)    All other components within normal limits  CBG MONITORING, ED - Abnormal; Notable for the following components:   Glucose-Capillary 122 (*)    All other components within normal limits  PROTIME-INR  APTT  CBC    EKG EKG Interpretation  Date/Time:  Saturday September 19 2020 19:29:58 EST Ventricular Rate:  72 PR Interval:    QRS Duration: 83 QT Interval:  402 QTC Calculation: 440 R Axis:   -18 Text Interpretation: Sinus rhythm Borderline left axis deviation Low voltage, precordial leads Probable anteroseptal infarct, old No significant change since last tracing Confirmed by Blanchie Dessert [phone]) on 09/19/2020 8:03:17 PM   Radiology CT HEAD WO CONTRAST  Result Date: 09/19/2020 CLINICAL DATA:  Altered mental status. EXAM: CT HEAD WITHOUT CONTRAST TECHNIQUE: Contiguous axial images were obtained from the base of the skull through the vertex without intravenous contrast. COMPARISON:  None. FINDINGS: Brain: There is mild cerebral atrophy with widening of the extra-axial spaces and ventricular dilatation. There are areas of decreased attenuation within the white matter tracts of the supratentorial brain, consistent with microvascular disease changes. Vascular: No hyperdense vessel or unexpected calcification. Skull: Negative for acute fracture or focal lesion. Hyperostosis frontalis is seen. Sinuses/Orbits: No acute finding. Other: None. IMPRESSION: No acute intracranial abnormality. Electronically Signed   By: Virgina Norfolk M.D.   On: 09/19/2020 19:18    Procedures  Procedures   Medications Ordered in ED Medications  sodium chloride flush (NS) 0.9 %  injection 3 mL (has no administration in time range)    ED Course  I have reviewed the triage vital signs and the nursing notes.  Pertinent labs & imaging results that were available during my care of the patient were reviewed by me and considered in my medical decision making (see chart for details).    MDM Rules/Calculators/A&P                          Patient presenting today with unusual symptoms of episodes of not being herself. Patient with a significant history of recent NSTEMI resulting in catheterization and stents placed in the LAD. Patient did start Brilinta but denied any other medication changes. She started noticing the episodes while hospitalized but today when she was discharged home she noticed that the episodes were worsening. She feels like she is in a tunnel and cannot remember things for a brief second is also been having some sharp pains in her head that are also brief. She denies any feeling like she is going to pass out and does not seem to be related with activity or position. One episode while she was here consisted of her staring off into space for a few seconds and then asking me what we were talking about. Otherwise her exam is normal. Her heart rate has been in the 80s and there were no sinus pauses or ectopy. Head CT was negative for acute bleed and labs are at baseline. Spoke with Dr. Leonel Ramsay with neurology who evaluated the patient. It is possible that this is mild delirium due to 4 days of lack of sleep and the stress of having a heart attack and being hospitalized however cannot rule out seizures. He requested MR/MRA and also would like patient to be admitted for observation and EEG. Findings were discussed with the patient and her son. She is willing to stay for further evaluation.  MDM Number of Diagnoses or Management Options   Amount and/or Complexity of Data Reviewed Clinical lab tests: ordered and reviewed Tests in the radiology section of CPT: ordered  and reviewed Tests in the medicine section of CPT: ordered and reviewed Decide to obtain previous medical records or to obtain history from someone other than the patient: yes Obtain history from someone other than the patient: yes Review and summarize past medical records: yes Discuss the patient with other providers: yes Independent visualization of images, tracings, or specimens: yes  Risk of Complications, Morbidity, and/or Mortality Presenting problems: moderate Diagnostic procedures: low Management options: low  Patient Progress Patient progress: stable    Final Clinical Impression(s) / ED Diagnoses Final diagnoses:  Transient alteration of awareness    Rx / DC Orders ED Discharge Orders    None       Blanchie Dessert, MD 09/19/20 2110    Blanchie Dessert, MD 09/19/20 2110

## 2020-09-19 NOTE — Consult Note (Addendum)
Neurology Consultation Reason for Consult: Transient alterations of awareness Referring Physician: Maryan Benjamin, W  CC: Transient alterations of awareness  History is obtained from: Patient, son  HPI: Jamie Benjamin is a 75 y.o. female with a recent admission for an NSTEMI status post intervention to the LAD now on brilinta.  She was discharged from the hospital earlier today.  Today, she has had problems with keeping track of conversations, suddenly becoming confused in the middle of the transform.  She also was not quite her normal self throughout much of the afternoon.  She had some problems with dizziness and possible similar symptoms earlier in the hospitalization, but her son reports that this was significantly different today.  Her mental status has improved per the son, but she continues to have intermittent episodes where she loses track of the conversation.  Twice during my evaluation, the patient momentarily lost track of what she was saying, but was able to regain her focus once I reoriented her to the conversation.  Her son states that this is significantly better than earlier in the day when even after reprompting, she had difficulty resuming the conversation.  There have not been any actual episodes of unresponsiveness or syncope.  She denies any lightheadedness.  She had an episode of delirium associated with scopolamine patch in the past and states the current episode felt similar, though this time she had no hallucinations.  LKW: Unclear given possible episodes during hospitalization tpa given?: no, mild symptoms, unclear time of onset   ROS: A 14 point ROS was performed and is negative except as noted in the HPI.  Past Medical History:  Diagnosis Date  . Allergy    seasonal  . Arthritis   . Breast cancer ([place]) 2005   left  . Depression   . Diverticulitis   . Hyperlipidemia   . Hypertension   . Hypothyroidism (acquired)   . IBS (irritable bowel syndrome)   . Obesity    . Prediabetes   . Stage 3a chronic kidney disease ([place]) 09/16/2020     Family History  Problem Relation Age of Onset  . Breast cancer Sister   . Ulcerative colitis Sister   . Colon cancer Neg Hx   . Colon polyps Neg Hx   . Esophageal cancer Neg Hx   . Rectal cancer Neg Hx   . Stomach cancer Neg Hx      Social History:  reports that she has never smoked. She has never used smokeless tobacco. She reports that she does not drink alcohol and does not use drugs.   Exam: Current vital signs: BP (!) 143/113   Pulse 86   Temp 98 F (36.7 C)   Resp 16   SpO2 99%  Vital signs in last 24 hours: Temp:  [97.7 F (36.5 C)-98 F (36.7 C)] 98 F (36.7 C) (02/19 1827) Pulse Rate:  [75-86] 86 (02/19 1910) Resp:  [15-16] 16 (02/19 1910) BP: (125-150)/(70-113) 143/113 (02/19 1910) SpO2:  [95 %-99 %] 99 % (02/19 1910) Weight:  [74 kg] 74 kg (02/19 0554)   Physical Exam  Constitutional: Appears well-developed and well-nourished.  Psych: Affect appropriate to situation Eyes: No scleral injection HENT: No OP obstruction MSK: no joint deformities.  Cardiovascular: Normal rate and regular rhythm.  Respiratory: Effort normal, non-labored breathing GI: Soft.  No distension. There is no tenderness.  Skin: WDI  Neuro: Mental Status: Patient is awake, alert, oriented to person, place, month, year, and situation. Patient is able to give a  clear and coherent history. No signs of aphasia or neglect Able to spell world backwards without delay and given number of quarters in $2.75 Cranial Nerves: II: Visual Fields are full. Pupils are equal, round, and reactive to light.   III,IV, VI: EOMI without ptosis or diploplia.  V: Facial sensation is symmetric to temperature VII: Facial movement is symmetric.  VIII: hearing is intact to voice X: Uvula elevates symmetrically XI: Shoulder shrug is symmetric. XII: tongue is midline without atrophy or fasciculations.  Motor: Tone is normal. Bulk  is normal. 5/5 strength was present in all four extremities.  Sensory: Sensation is symmetric to light touch and temperature in the arms and legs. Cerebellar: FNF and HKS are intact bilaterally   I have reviewed labs in epic and the results pertinent to this consultation are: CBC-normal Sodium 138 BUN 25 Calcium 10.0  I have reviewed the images obtained: CT head-unremarkable  Impression: 75 year old female with recurrent episodes of transient attentional problems.  My suspicion is that this does represent a disorder of attention (e.g. mild delirium) associated with her general medical condition and will likely improve over time.  I would expect more difficulty with world backwards, however in would favor assessing for other conditions such as stroke or seizure.  Recommendations: 1) MRI brain, MRA head and neck 2) EEG 3) ammonia, UA   Roland Rack, MD [facility] [phone]  If 7pm- 7am, please page neurology on call as listed in [place].

## 2020-09-19 NOTE — Plan of Care (Signed)

## 2020-09-19 NOTE — H&P (Signed)
History and Physical  Patient Name: Jamie Benjamin     WCB:[id number]    DOB: 01/25/1946    DOA: 09/19/2020 PCP: Maurice Small, MD  Patient coming from: Home  Chief Complaint: Metabolic encephalopathy    HPI: Jamie Benjamin is a 75 y.o. female with a history of hypertension, hyperlipidemia, hypothyroidism, CKD stage III a, recent NSTEMI with stent placed in the LAD and discharged from the hospital today who presented back to the ER tonight, 09/19/2020, with change in behavior.  Patient was just recently hospitalized for NSTEMI and had stent placement in LAD with discharge this morning. However patient has noticed lapses in memory, even while she was hospitalized. She says she has these spells when she seems to go into the tunnel and does not remember things. She noticed more today that she had these lapses in memory. She also has occasional brief sharp headaches in the right temporal area for the last for few seconds that occur several times throughout the day. No dysphagia or loss of consciousness. Denies any fever, shortness of breath, chest pain, nausea or vomiting. She has been compliant with her medications. Her son, who is a ER provider, saw her during 1 of these episodes and said she just was not acting right. No prior history of CVA or A. fib. She did have an episode of delirium during her previous hospitalization for shoulder surgery however nothing since. Due to these symptoms, she came into the ER to be evaluated.  ED course: -On arrival, afebrile, heart rate 75, respiratory rate 16, blood pressure 150/75, maintaining sats on room air -Labs on admission: Sodium 130, potassium 4.1, chloride 104, bicarb 21, glucose 136, BUN 25, creatinine 1.33, WBC 8.5, hemoglobin 14.6 -Neurology evaluated in the ED. Recommended EEG, MRI brain, and MRA head and neck, with placement observation -The hospitalist service was contacted for placement observation     ROS: A complete and thorough review of  systems obtained, negative unless stated in the HPI.      Past Medical History:  Diagnosis Date  . Allergy    seasonal  . Arthritis   . Breast cancer ([place]) 2005   left  . Depression   . Diverticulitis   . Hyperlipidemia   . Hypertension   . Hypothyroidism (acquired)   . IBS (irritable bowel syndrome)   . Obesity   . Prediabetes   . Stage 3a chronic kidney disease ([place]) 09/16/2020    Past Surgical History:  Procedure Laterality Date  . COLONOSCOPY    . CORONARY STENT INTERVENTION N/A 09/17/2020   Procedure: CORONARY STENT INTERVENTION;  Surgeon: Martinique, Peter M, MD;  Location: [facility];  Service: Cardiovascular;  Laterality: N/A;  . INTRAVASCULAR IMAGING/OCT N/A 09/17/2020   Procedure: INTRAVASCULAR IMAGING/OCT;  Surgeon: Martinique, Peter M, MD;  Location: [facility];  Service: Cardiovascular;  Laterality: N/A;  . LEFT HEART CATH AND CORONARY ANGIOGRAPHY N/A 09/17/2020   Procedure: LEFT HEART CATH AND CORONARY ANGIOGRAPHY;  Surgeon: Martinique, Peter M, MD;  Location: [facility];  Service: Cardiovascular;  Laterality: N/A;  . MASTECTOMY, RADICAL Bilateral 10 years ago  . ROTATOR CUFF REPAIR      Social History: Patient lives at home.  The patient walks without assistance.  Non-smoker.  Allergies  Allergen Reactions  . Influenza Virus Vaccine Other (See Comments)  . Lisinopril Cough    Family history: family history includes Breast cancer in her sister; Ulcerative colitis in her sister.  Prior to Admission  medications   Medication Sig Start Date End Date Taking? Authorizing Provider  amLODipine (NORVASC) 10 MG tablet Take 10 mg by mouth daily. 05/26/19  Yes [provider]  aspirin EC 81 MG tablet Take 81 mg by mouth daily.   Yes [provider]  buPROPion (WELLBUTRIN XL) 300 MG 24 hr tablet Take 300 mg by mouth daily.   Yes [provider]  cholecalciferol (VITAMIN D) 1000 UNITS tablet Take 2,000 Units by mouth daily.   Yes  [provider]  glipiZIDE (GLUCOTROL XL) 2.5 MG 24 hr tablet Take 2.5 mg by mouth daily with breakfast.   Yes [provider]  levothyroxine (SYNTHROID, LEVOTHROID) 50 MCG tablet Take 50 mcg by mouth daily before breakfast.   Yes [provider]  losartan (COZAAR) 50 MG tablet Take 50 mg by mouth daily.   Yes [provider]  Melatonin 10 MG TABS Take 10 mg by mouth at bedtime as needed.   Yes [provider]  metoprolol tartrate (LOPRESSOR) 100 MG tablet Take 100 mg by mouth 2 (two) times daily.   Yes [provider]  montelukast (SINGULAIR) 10 MG tablet Take 10 mg by mouth daily as needed (allergies).   Yes [provider]  pravastatin (PRAVACHOL) 40 MG tablet Take 40 mg by mouth at bedtime. 08/25/14  Yes [provider]  ticagrelor (BRILINTA) 90 MG TABS tablet Take 1 tablet (90 mg total) by mouth 2 (two) times daily. 09/19/20  Yes Regalado, Belkys A, MD       Physical Exam: BP (!) 143/113   Pulse 86   Temp 98 F (36.7 C)   Resp 16   SpO2 99%  General appearance: Well-developed, adult female, alert and in no acute distress.   Eyes: Anicteric, conjunctiva pink, lids and lashes normal. PERRL.    ENT: No nasal deformity, discharge, epistaxis.  Hearing intact. OP moist without lesions.   Neck: No neck masses.  Trachea midline.  No thyromegaly/tenderness. Lymph: No cervical or supraclavicular lymphadenopathy. Skin: Warm and dry.  No jaundice.  No suspicious rashes or lesions. Cardiac: RRR, nl S1-S2, no murmurs appreciated.  Capillary refill is brisk.  No LE edema.  Radial and pedal pulses 2+ and symmetric. Respiratory: Normal respiratory rate and rhythm.  CTAB without rales or wheezes. Abdomen: Abdomen soft. Nontender, bowel sounds present. No ascites, distension, hepatosplenomegaly.   MSK: No deformities or effusions of the large joints of the upper or lower extremities bilaterally.  No cyanosis or clubbing. Neuro:  Cranial nerves 2 through 12 grossly intact.  Sensation intact to light touch. Speech is fluent.    Psych: Sensorium intact and responding to questions, attention normal.  Behavior appropriate.  Affect normal.  Judgment and insight appear normal.     Labs on Admission:  I have personally reviewed following labs and imaging studies: CBC: Recent Labs  Lab 09/16/20 0431 09/17/20 0427 09/18/20 0140 09/19/20 1848  WBC 8.9 8.0 10.1 8.5  NEUTROABS 6.3  --   --  5.6  HGB 16.0* 13.8 14.2 14.6  HCT 48.5* 39.4 41.9 44.3  MCV 91.9 90.2 89.9 92.1  PLT 329 273 265 161   Basic Metabolic Panel: Recent Labs  Lab 09/16/20 0431 09/18/20 0140 09/18/20 0942 09/19/20 0307 09/19/20 1848  NA 139 139 137 143 138  K 5.0 3.7 4.0 3.8 4.1  CL 106 104 102 109 104  CO2 22 24 22 23  21*  GLUCOSE 156* 134* 247* 106* 136*  BUN 31* 24* 19  18 25*  CREATININE 1.30* 1.44* 1.55* 1.26* 1.33*  CALCIUM 9.5 9.0 9.5 9.2 10.0   GFR: Estimated Creatinine Clearance: 31.2 mL/min (A) (by C-G formula based on SCr of 1.33 mg/dL (H)).  Liver Function Tests: Recent Labs  Lab 09/16/20 0431 09/19/20 1848  AST 37 28  ALT 36 25  ALKPHOS 80 76  BILITOT 1.3* 0.5  PROT 7.3 7.3  ALBUMIN 4.1 4.0   No results for input(s): LIPASE, AMYLASE in the last 168 hours. No results for input(s): AMMONIA in the last 168 hours. Coagulation Profile: Recent Labs  Lab 09/16/20 0431 09/19/20 1848  INR 1.0 1.0   Cardiac Enzymes: No results for input(s): CKTOTAL, CKMB, CKMBINDEX, TROPONINI in the last 168 hours. BNP (last 3 results) No results for input(s): PROBNP in the last 8760 hours. HbA1C: No results for input(s): HGBA1C in the last 72 hours. CBG: Recent Labs  Lab 09/18/20 1620 09/18/20 1746 09/18/20 2059 09/19/20 0806 09/19/20 1837  GLUCAP 67* 106* 159* 97 122*   Lipid Profile: No results for input(s): CHOL, HDL, LDLCALC, TRIG, CHOLHDL, LDLDIRECT in the last 72 hours. Thyroid Function Tests: No results for  input(s): TSH, T4TOTAL, FREET4, T3FREE, THYROIDAB in the last 72 hours. Anemia Panel: No results for input(s): VITAMINB12, FOLATE, FERRITIN, TIBC, IRON, RETICCTPCT in the last 72 hours.   Recent Results (from the past 240 hour(s))  Resp Panel by RT-PCR (Flu A&B, Covid) Nasopharyngeal Swab     Status: None   Collection Time: 09/16/20  4:40 AM   Specimen: Nasopharyngeal Swab; Nasopharyngeal(NP) swabs in vial transport medium  Result Value Ref Range Status   SARS Coronavirus 2 by RT PCR NEGATIVE NEGATIVE Final    Comment: (NOTE) SARS-CoV-2 target nucleic acids are NOT DETECTED.  The SARS-CoV-2 RNA is generally detectable in upper respiratory specimens during the acute phase of infection. The lowest concentration of SARS-CoV-2 viral copies this assay can detect is 138 copies/mL. A negative result does not preclude SARS-Cov-2 infection and should not be used as the sole basis for treatment or other patient management decisions. A negative result may occur with  improper specimen collection/handling, submission of specimen other than nasopharyngeal swab, presence of viral mutation(s) within the areas targeted by this assay, and inadequate number of viral copies(<138 copies/mL). A negative result must be combined with clinical observations, patient history, and epidemiological information. The expected result is Negative.  Fact Sheet for Patients:  EntrepreneurPulse.com.au  Fact Sheet for Healthcare Providers:  IncredibleEmployment.be  This test is no t yet approved or cleared by the [place] FDA and  has been authorized for detection and/or diagnosis of SARS-CoV-2 by FDA under an Emergency Use Authorization (EUA). This EUA will remain  in effect (meaning this test can be used) for the duration of the COVID-19 declaration under Section 564(b)(1) of the Act, 21 U.S.C.section 360bbb-3(b)(1), unless the authorization is terminated  or revoked  sooner.       Influenza A by PCR NEGATIVE NEGATIVE Final   Influenza B by PCR NEGATIVE NEGATIVE Final    Comment: (NOTE) The Xpert Xpress SARS-CoV-2/FLU/RSV plus assay is intended as an aid in the diagnosis of influenza from Nasopharyngeal swab specimens and should not be used as a sole basis for treatment. Nasal washings and aspirates are unacceptable for Xpert Xpress SARS-CoV-2/FLU/RSV testing.  Fact Sheet for Patients: EntrepreneurPulse.com.au  Fact Sheet for Healthcare Providers: IncredibleEmployment.be  This test is not yet approved or cleared by the [place] FDA and has been authorized for detection and/or diagnosis  of SARS-CoV-2 by FDA under an Emergency Use Authorization (EUA). This EUA will remain in effect (meaning this test can be used) for the duration of the COVID-19 declaration under Section 564(b)(1) of the Act, 21 U.S.C. section 360bbb-3(b)(1), unless the authorization is terminated or revoked.  Performed at [facility], [place] [address], [place], [place] [address]            Radiological Exams on Admission: Personally reviewed CT head, no acute processes. CT HEAD WO CONTRAST  Result Date: 09/19/2020 CLINICAL DATA:  Altered mental status. EXAM: CT HEAD WITHOUT CONTRAST TECHNIQUE: Contiguous axial images were obtained from the base of the skull through the vertex without intravenous contrast. COMPARISON:  None. FINDINGS: Brain: There is mild cerebral atrophy with widening of the extra-axial spaces and ventricular dilatation. There are areas of decreased attenuation within the white matter tracts of the supratentorial brain, consistent with microvascular disease changes. Vascular: No hyperdense vessel or unexpected calcification. Skull: Negative for acute fracture or focal lesion. Hyperostosis frontalis is seen. Sinuses/Orbits: No acute finding. Other: None. IMPRESSION: No acute intracranial abnormality. Electronically  Signed   By: Virgina Norfolk M.D.   On: 09/19/2020 19:18       Assessment/Plan   1. Metabolic encephalopathy: -CT head on admission showed no acute processes -Neurology evaluated in the ED. Felt possibly due to mild delirium however cannot rule out seizures. Recommended EEG, MRI brain, and MRA head and neck -Delirium precautions -Encouraged sleeping at night. Melatonin as needed.   2. Recent non-STEMI acute coronary syndrome: -On 09/18/2019, underwent cardiac cath which showed critical LAD stenosis of 99%. Status post DES placement x2. -Continue home aspirin, Brilinta, statin, metoprolol, losartan    3-Hypertension: - Continue with Norvasc and metoprolol. -Resume cozaar, renal function normalized.   4. prediabetes:Hemoglobin A1c 6.3. -She takes glipizide at home, will hold for now -Commend following up with her PCP to considerchanging glipizide forempagliflozin.   5. CKD stage IIIa/b: -Creatinine increased to 1.5 post cath, but now down to 1.3 -Avoid nephrotoxic agents -We will continue to monitor  6. hypothyroidism: -Continue with Synthroid  7. depression: -Continue with Wellbutrin  8. HLD  -LDL 78. -Patient is not able to tolerate Lipitor. Continue home pravastatin.  -She will follow up with Dr Gwenlyn Found to discuss Zetia and or PCSK9      DVT prophylaxis: Lovenox Code Status: Full Family Communication: None Disposition Plan: Anticipate discharge home once stable Consults called: Neurology contacted by ER Admission status: Observation   At the point of initial evaluation, it is my clinical opinion that admission for OBSERVATION is reasonable and necessary because the patient's presenting complaints in the context of their chronic conditions represent sufficient risk of deterioration or significant morbidity to constitute reasonable grounds for close observation in the hospital setting, but that the patient may be medically stable for discharge from  the hospital within 24 to 48 hours.    Medical decision making: Patient seen at 10:07 PM on 09/19/2020.  The patient was discussed with ER provider.  What exists of the patient's chart was reviewed in depth and summarized above.  Clinical condition: Stable.        Doran Heater [facility] Please page though [place] or Epic secure chat:  For password, contact charge nurse

## 2020-09-19 NOTE — ED Notes (Signed)
Pt transferred to MRI

## 2020-09-19 NOTE — ED Triage Notes (Signed)
Pt discharged from hospital today after stent placement on Thursday.  Pt with AMS and episodes of not being able to think since 1630 today.  Son (EDP) reports speech is different.  Pt reports intermittent sharp R temporal headache and dizziness.  Denies pain at present.  No focal neuro deficits.

## 2020-09-19 NOTE — ED Notes (Signed)
Pt staying in Triage 1 until room is ready.

## 2020-09-19 NOTE — Discharge Instructions (Signed)
Heart Attack A heart attack occurs when blood and oxygen supply to the heart is cut off. A heart attack causes damage to the heart that cannot be fixed. A heart attack is also called a myocardial infarction, or MI. If you think you are having a heart attack, do not wait to see if the symptoms will go away. Get medical help right away. What are the causes? This condition may be caused by:  A fatty substance (plaque) in the blood vessels (arteries). This can block the flow of blood to the heart.  A blood clot in the blood vessels that go to the heart. The blood clot blocks blood flow.  Low blood pressure.  An abnormal heartbeat.  Some diseases, such as problems in red blood cells (anemia)orproblems in breathing (respiratory failure).  Tightening (spasm) of a blood vessel that cuts off blood to the heart.  A tear in a blood vessel of the heart.  High blood pressure.   What increases the risk? The following factors may make you more likely to develop this condition:  Aging. The older you are, the higher your risk.  Having a personal or family history of chest pain, heart attack, stroke, or narrowing of the arteries in the legs, arms, head, or stomach (peripheral artery disease).  Being female.  Smoking.  Not getting regular exercise.  Being overweight or obese.  Having high blood pressure.  Having high cholesterol.  Having diabetes.  Drinking too much alcohol.  Using illegal drugs, such as cocaine or methamphetamine. What are the signs or symptoms? Symptoms of this condition include:  Chest pain. It may feel like: ? Crushing or squeezing. ? Tightness, pressure, fullness, or heaviness.  Pain in the arm, neck, jaw, back, or upper body.  Shortness of breath.  Heartburn.  Upset stomach (indigestion).  Feeling like you may vomit (nauseous).  Cold sweats.  Feeling tired.  Sudden light-headedness. How is this treated? A heart attack must be treated as soon as  possible. Treatment may include:  Medicines to: ? Break up or dissolve blood clots. ? Thin blood and help prevent blood clots. ? Treat blood pressure. ? Improve blood flow to the heart. ? Reduce pain. ? Reduce cholesterol.  Procedures to widen a blocked artery and keep it open.  Open heart surgery.  Receiving oxygen.  Making your heart strong again (cardiac rehabilitation) through exercise, education, and counseling.   Follow these instructions at home: Medicines  Take over-the-counter and prescription medicines only as told by your doctor. You may need to take medicine: ? To keep your blood from clotting too easily. ? To control blood pressure. ? To lower cholesterol. ? To control heart rhythms.  Do not take these medicines unless your doctor says it is okay: ? NSAIDs, such as ibuprofen. ? Supplements that have vitamin A, vitamin E, or both. ? Hormone replacement therapy that has estrogen with or without progestin. Lifestyle  Do not use any products that have nicotine or tobacco, such as cigarettes, e-cigarettes, and chewing tobacco. If you need help quitting, ask your doctor.  Avoid secondhand smoke.  Exercise regularly. Ask your doctor about a cardiac rehab program.  Eat heart-healthy foods. Your doctor will tell you what foods to eat.  Stay at a healthy weight.  Lower your stress level.  Do not use illegal drugs.      Alcohol use  Do not drink alcohol if: ? Your doctor tells you not to drink. ? You are pregnant, may be pregnant,  or are planning to become pregnant.  If you drink alcohol: ? Limit how much you use to:  0-1 drink a day for women.  0-2 drinks a day for men. ? Know how much alcohol is in your drink. In the [place] one drink equals one 12 oz bottle of beer (355 mL), one 5 oz glass of wine (148 mL), or one 1 oz glass of hard liquor (44 mL). General instructions  Work with your doctor to treat other problems you may have, such as diabetes or  high blood pressure.  Get screened for depression. Get treatment if needed.  Keep your vaccines up to date. Get the flu shot (influenza vaccine) every year.  Keep all follow-up visits as told by your doctor. This is important. Contact a doctor if:  You feel very sad.  You have trouble doing your daily activities. Get help right away if:  You have sudden, unexplained discomfort in your chest, arms, back, neck, jaw, or upper body.  You have shortness of breath.  You have sudden sweating or clammy skin.  You feel like you may vomit.  You vomit.  You feel tired or weak.  You get light-headed or dizzy.  You feel your heart beating fast.  You feel your heart skipping beats.  You have blood pressure that is higher than 180/120. These symptoms may be an emergency. Do not wait to see if the symptoms will go away. Get medical help right away. Call your local emergency services (911 in the [place] Do not drive yourself to the hospital. Summary  A heart attack occurs when blood and oxygen supply to the heart is cut off.  Do not take NSAIDs unless your doctor says it is okay.  Do not smoke. Avoid secondhand smoke.  Exercise regularly. Ask your doctor about a cardiac rehab program. This information is not intended to replace advice given to you by your health care provider. Make sure you discuss any questions you have with your health care provider. Document Revised: 10/29/2018 Document Reviewed: 10/29/2018 Elsevier Patient Education  2021 [place].   Chest Wall Pain Chest wall pain is pain in or around the bones and muscles of your chest. Chest wall pain may be caused by:  An injury.  Coughing a lot.  Using your chest and arm muscles too much. Sometimes, the cause may not be known. This pain may take a few weeks or longer to get better. Follow these instructions at home: Managing pain, stiffness, and swelling If told, put ice on the painful area:  Put ice in a  plastic bag.  Place a towel between your skin and the bag.  Leave the ice on for 20 minutes, 2-3 times a day.   Activity  Rest as told by your doctor.  Avoid doing things that cause pain. This includes lifting heavy items.  Ask your doctor what activities are safe for you. General instructions  Take over-the-counter and prescription medicines only as told by your doctor.  Do not use any products that contain nicotine or tobacco, such as cigarettes, e-cigarettes, and chewing tobacco. If you need help quitting, ask your doctor.  Keep all follow-up visits as told by your doctor. This is important.   Contact a doctor if:  You have a fever.  Your chest pain gets worse.  You have new symptoms. Get help right away if:  You feel sick to your stomach (nauseous) or you throw up (vomit).  You feel sweaty or light-headed.  You  have a cough with mucus from your lungs (sputum) or you cough up blood.  You are short of breath. These symptoms may be an emergency. Do not wait to see if the symptoms will go away. Get medical help right away. Call your local emergency services (911 in the [place] Do not drive yourself to the hospital. Summary  Chest wall pain is pain in or around the bones and muscles of your chest.  It may be treated with ice, rest, and medicines. Your condition may also get better if you avoid doing things that cause pain.  Contact a doctor if you have a fever, chest pain that gets worse, or new symptoms.  Get help right away if you feel light-headed or you get short of breath. These symptoms may be an emergency. This information is not intended to replace advice given to you by your health care provider. Make sure you discuss any questions you have with your health care provider. Document Revised: 01/18/2018 Document Reviewed: 01/18/2018 Elsevier Patient Education  [place] need to take Brilinta  and aspirin for 12 months.  Follow up with Dr Gwenlyn Found in 3  weeks to discuss medications for high cholesterol ( zetia vs Repatha)  Follow up with your PCP to discuss changing glipizide to Jardiance.     Heart Attack The heart is a muscle that needs oxygen to survive. A heart attack is a condition that occurs when your heart does not get enough oxygen. When this happens, the heart muscle begins to die. This can cause permanent damage if not treated right away. A heart attack is a medical emergency. This condition may be called a myocardial infarction, or MI. It is also known as acute coronary syndrome (ACS). ACS is a term used to describe a group of conditions that affect blood flow to the heart. What are the causes? This condition may be caused by:  Atherosclerosis. This occurs when a fatty substance called plaque builds up in the arteries and blocks or reduces blood supply to the heart.  A blood clot. A blood clot can develop suddenly when plaque breaks up within an artery and blocks blood flow to the heart.  Low blood pressure.  An abnormal heartbeat (arrhythmia).  Conditions that cause a decrease of oxygen to the heart, such as anemiaorrespiratory failure.  A spasm, or severe tightening, of a blood vessel that cuts off blood flow to the heart.  Tearing of a coronary artery (spontaneous coronary artery dissection).  High blood pressure.   What increases the risk? The following factors may make you more likely to develop this condition:  Aging. The older you are, the higher your risk.  Having a personal or family history of chest pain, heart attack, stroke, or narrowing of the arteries in the legs, arms, head, or stomach (peripheral artery disease).  Being female.  Smoking.  Not getting regular exercise.  Being overweight or obese.  Having high blood pressure.  Having high cholesterol (hypercholesterolemia).  Having diabetes.  Drinking too much alcohol.  Using illegal drugs, such as cocaine or methamphetamine. What are the  signs or symptoms? Symptoms of this condition may vary, depending on factors like gender and age. Symptoms may include:  Chest pain. It may feel like: ? Crushing or squeezing. ? Tightness, pressure, fullness, or heaviness.  Pain in the arm, neck, jaw, back, or upper body.  Shortness of breath.  Heartburn or upset stomach.  Nausea.  Sudden cold sweats.  Feeling tired.  Sudden  light-headedness. How is this diagnosed? This condition may be diagnosed through tests, such as:  Electrocardiogram (ECG) to measure the electrical activity of your heart.  Blood tests to check for cardiac markers. These chemicals are released by a damaged heart muscle.  A test to evaluate blood flow and heart function (coronary angiogram).  CT scan to see the heart more clearly.  A test to evaluate the pumping action of the heart (echocardiogram). How is this treated? A heart attack must be treated as soon as possible. Treatment may include:  Medicines to: ? Break up or dissolve blood clots (fibrinolytic therapy). ? Thin blood and help prevent blood clots. ? Treat blood pressure. ? Improve blood flow to the heart. ? Reduce pain. ? Reduce cholesterol.  Angioplasty and stent placement. These are procedures to widen a blocked artery and keep it open.  Coronary artery bypass graft, CABG, or open heart surgery. This enables blood to flow to the heart by going around the blocked part of the artery.  Oxygen therapy if needed.  Cardiac rehabilitation. This improves your health and well-being through exercise, education, and counseling.   Follow these instructions at home: Medicines  Take over-the-counter and prescription medicines only as told by your health care provider.  Do not take the following medicines unless your health care provider says it is okay to take them: ? NSAIDs, such as ibuprofen. ? Supplements that contain vitamin A, vitamin E, or both. ? Hormone replacement therapy that  contains estrogen with or without progestin. Lifestyle  Do not use any products that contain nicotine or tobacco, such as cigarettes, e-cigarettes, and chewing tobacco. If you need help quitting, ask your health care provider.  Avoid secondhand smoke.  Exercise regularly. Ask your health care provider about participating in a cardiac rehabilitation program that helps you start exercising safely after a heart attack.  Eat a heart-healthy diet. Your health care provider will tell you what foods to eat.  Maintain a healthy weight.  Learn ways to manage stress.  Do not use illegal drugs.   Alcohol use  Do not drink alcohol if: ? Your health care provider tells you not to drink. ? You are pregnant, may be pregnant, or are planning to become pregnant.  If you drink alcohol: ? Limit how much you use to:  0-1 drink a day for women.  0-2 drinks a day for men. ? Be aware of how much alcohol is in your drink. In the [place] one drink equals one 12 oz bottle of beer (355 mL), one 5 oz glass of wine (148 mL), or one 1 oz glass of hard liquor (44 mL). General instructions  Work with your health care provider to manage any other conditions you have, such as high blood pressure or diabetes. These conditions affect your heart.  Get screened for depression, and seek treatment if needed.  Keep your vaccinations up to date. Get the flu vaccine every year.  Keep all follow-up visits as told by your health care provider. This is important. Contact a health care provider if:  You feel overwhelmed or sad.  You have trouble doing your daily activities. Get help right away if:  You have sudden, unexplained discomfort in your chest, arms, back, neck, jaw, or upper body.  You have shortness of breath.  You suddenly start to sweat or your skin gets clammy.  You feel nauseous or you vomit.  You have unexplained tiredness or weakness.  You suddenly feel light-headed or dizzy.  You  notice  your heart starts to beat fast or feels like it is skipping beats.  You have blood pressure that is higher than 180/120. These symptoms may represent a serious problem that is an emergency. Do not wait to see if the symptoms will go away. Get medical help right away. Call your local emergency services (911 in the [place] Do not drive yourself to the hospital. Summary  A heart attack, also called myocardial infarction, is a condition that occurs when your heart does not get enough oxygen. This is caused by anything that blocks or reduces blood flow to the heart.  Treatment is a combination of medicines and surgeries, if needed, to open the blocked arteries and restore blood flow to the heart.  A heart attack is an emergency. Get help right away if you have sudden discomfort in your chest, arms, back, neck, jaw, or upper body. Seek help if you feel nauseous, you vomit, or you feel light-headed or dizzy. This information is not intended to replace advice given to you by your health care provider. Make sure you discuss any questions you have with your health care provider. Document Revised: 10/25/2018 Document Reviewed: 10/29/2018 Elsevier Patient Education  [place].

## 2020-09-20 ENCOUNTER — Inpatient Hospital Stay (HOSPITAL_COMMUNITY): Payer: Medicare Other

## 2020-09-20 DIAGNOSIS — E6609 Other obesity due to excess calories: Secondary | ICD-10-CM | POA: Diagnosis present

## 2020-09-20 DIAGNOSIS — Z6833 Body mass index (BMI) 33.0-33.9, adult: Secondary | ICD-10-CM | POA: Diagnosis not present

## 2020-09-20 DIAGNOSIS — Z79890 Hormone replacement therapy (postmenopausal): Secondary | ICD-10-CM | POA: Diagnosis not present

## 2020-09-20 DIAGNOSIS — I639 Cerebral infarction, unspecified: Secondary | ICD-10-CM | POA: Diagnosis present

## 2020-09-20 DIAGNOSIS — I63432 Cerebral infarction due to embolism of left posterior cerebral artery: Secondary | ICD-10-CM

## 2020-09-20 DIAGNOSIS — R404 Transient alteration of awareness: Secondary | ICD-10-CM | POA: Diagnosis not present

## 2020-09-20 DIAGNOSIS — R29700 NIHSS score 0: Secondary | ICD-10-CM | POA: Diagnosis present

## 2020-09-20 DIAGNOSIS — Z20822 Contact with and (suspected) exposure to covid-19: Secondary | ICD-10-CM | POA: Diagnosis present

## 2020-09-20 DIAGNOSIS — F32A Depression, unspecified: Secondary | ICD-10-CM | POA: Diagnosis present

## 2020-09-20 DIAGNOSIS — Z9013 Acquired absence of bilateral breasts and nipples: Secondary | ICD-10-CM | POA: Diagnosis not present

## 2020-09-20 DIAGNOSIS — I252 Old myocardial infarction: Secondary | ICD-10-CM | POA: Diagnosis not present

## 2020-09-20 DIAGNOSIS — G9341 Metabolic encephalopathy: Secondary | ICD-10-CM | POA: Diagnosis present

## 2020-09-20 DIAGNOSIS — R4701 Aphasia: Secondary | ICD-10-CM | POA: Diagnosis present

## 2020-09-20 DIAGNOSIS — N1832 Chronic kidney disease, stage 3b: Secondary | ICD-10-CM | POA: Diagnosis not present

## 2020-09-20 DIAGNOSIS — I10 Essential (primary) hypertension: Secondary | ICD-10-CM | POA: Diagnosis not present

## 2020-09-20 DIAGNOSIS — E785 Hyperlipidemia, unspecified: Secondary | ICD-10-CM | POA: Diagnosis present

## 2020-09-20 DIAGNOSIS — I2510 Atherosclerotic heart disease of native coronary artery without angina pectoris: Secondary | ICD-10-CM | POA: Diagnosis present

## 2020-09-20 DIAGNOSIS — Z79899 Other long term (current) drug therapy: Secondary | ICD-10-CM | POA: Diagnosis not present

## 2020-09-20 DIAGNOSIS — R410 Disorientation, unspecified: Secondary | ICD-10-CM | POA: Diagnosis present

## 2020-09-20 DIAGNOSIS — R7303 Prediabetes: Secondary | ICD-10-CM | POA: Diagnosis present

## 2020-09-20 DIAGNOSIS — Z853 Personal history of malignant neoplasm of breast: Secondary | ICD-10-CM | POA: Diagnosis not present

## 2020-09-20 DIAGNOSIS — Z803 Family history of malignant neoplasm of breast: Secondary | ICD-10-CM | POA: Diagnosis not present

## 2020-09-20 DIAGNOSIS — Z955 Presence of coronary angioplasty implant and graft: Secondary | ICD-10-CM | POA: Diagnosis not present

## 2020-09-20 DIAGNOSIS — I129 Hypertensive chronic kidney disease with stage 1 through stage 4 chronic kidney disease, or unspecified chronic kidney disease: Secondary | ICD-10-CM | POA: Diagnosis present

## 2020-09-20 DIAGNOSIS — Z7982 Long term (current) use of aspirin: Secondary | ICD-10-CM | POA: Diagnosis not present

## 2020-09-20 DIAGNOSIS — N1831 Chronic kidney disease, stage 3a: Secondary | ICD-10-CM | POA: Diagnosis present

## 2020-09-20 LAB — COMPREHENSIVE METABOLIC PANEL
ALT: 24 U/L (ref 0–44)
AST: 24 U/L (ref 15–41)
Albumin: 3.5 g/dL (ref 3.5–5.0)
Alkaline Phosphatase: 69 U/L (ref 38–126)
Anion gap: 11 (ref 5–15)
BUN: 19 mg/dL (ref 8–23)
CO2: 22 mmol/L (ref 22–32)
Calcium: 9.6 mg/dL (ref 8.9–10.3)
Chloride: 107 mmol/L (ref 98–111)
Creatinine, Ser: 1.23 mg/dL — ABNORMAL HIGH (ref 0.44–1.00)
GFR, Estimated: 46 mL/min — ABNORMAL LOW (ref >60)
Glucose, Bld: 102 mg/dL — ABNORMAL HIGH (ref 70–99)
Potassium: 3.7 mmol/L (ref 3.5–5.1)
Sodium: 140 mmol/L (ref 135–145)
Total Bilirubin: 1.2 mg/dL (ref 0.3–1.2)
Total Protein: 6.4 g/dL — ABNORMAL LOW (ref 6.5–8.1)

## 2020-09-20 LAB — SARS CORONAVIRUS 2 (TAT 6-24 HRS): SARS Coronavirus 2: NEGATIVE

## 2020-09-20 LAB — CBC
HCT: 40.8 % (ref 36.0–46.0)
Hemoglobin: 14.3 g/dL (ref 12.0–15.0)
MCH: 31.7 pg (ref 26.0–34.0)
MCHC: 35 g/dL (ref 30.0–36.0)
MCV: 90.5 fL (ref 80.0–100.0)
Platelets: 256 10*3/uL (ref 150–400)
RBC: 4.51 MIL/uL (ref 3.87–5.11)
RDW: 13.2 % (ref 11.5–15.5)
WBC: 7.9 10*3/uL (ref 4.0–10.5)
nRBC: 0 % (ref 0.0–0.2)

## 2020-09-20 LAB — GLUCOSE, CAPILLARY
Glucose-Capillary: 122 mg/dL — ABNORMAL HIGH (ref 70–99)
Glucose-Capillary: 139 mg/dL — ABNORMAL HIGH (ref 70–99)

## 2020-09-20 LAB — URINALYSIS, ROUTINE W REFLEX MICROSCOPIC
Bilirubin Urine: NEGATIVE
Glucose, UA: NEGATIVE mg/dL
Hgb urine dipstick: NEGATIVE
Ketones, ur: NEGATIVE mg/dL
Leukocytes,Ua: NEGATIVE
Nitrite: NEGATIVE
Protein, ur: NEGATIVE mg/dL
Specific Gravity, Urine: 1.006 (ref 1.005–1.030)
pH: 7 (ref 5.0–8.0)

## 2020-09-20 LAB — AMMONIA: Ammonia: 21 umol/L (ref 9–35)

## 2020-09-20 LAB — VITAMIN B12: Vitamin B-12: 371 pg/mL (ref 180–914)

## 2020-09-20 MED ORDER — STROKE: EARLY STAGES OF RECOVERY BOOK: Freq: Once | Status: DC

## 2020-09-20 MED ORDER — PRAVASTATIN SODIUM 40 MG PO TABS
80.0000 mg | ORAL_TABLET | Freq: Every day | ORAL | Status: DC
  Administered 2020-09-20: 80 mg via ORAL
  Filled 2020-09-20: qty 2

## 2020-09-20 MED ORDER — INSULIN ASPART 100 UNIT/ML ~~LOC~~ SOLN: 0.0000 [IU] | Freq: Three times a day (TID) | SUBCUTANEOUS | Status: DC

## 2020-09-20 NOTE — ED Notes (Signed)
Lunch Tray Ordered @ 1016. 

## 2020-09-20 NOTE — Progress Notes (Signed)
PT Cancellation Note  Patient Details Name: Jamie Benjamin MRN: [id number] DOB: 06/08/1946   Cancelled Treatment:    Reason Eval/Treat Not Completed: Patient at procedure or test/unavailable. Pt undergoing spot EEG. Pt to return as able to complete PT eval.  [name], PT, DPT Acute Rehabilitation Services Pager #: [phone] Office #: [phone]    [name] 09/20/2020, 3:38 PM

## 2020-09-20 NOTE — ED Notes (Signed)
Breakfast ordered 

## 2020-09-20 NOTE — ED Notes (Signed)
Pt ambulated to the bathroom without assistance. 

## 2020-09-20 NOTE — ED Notes (Signed)
Vascular at bedside for EEG.

## 2020-09-20 NOTE — ED Notes (Signed)
hospitalist at bedside

## 2020-09-20 NOTE — Consult Note (Addendum)
Neurology Progress Note  S: Doing well now. No further word finding difficulties or feeling foggy. She is not confused on exam. She tells NP that she was always aware during these episodes at home. No seizure history, brain surgery or brain trauma. Wants to go home.   Son was present when MD saw patient. Son's story is different when NP saw patient. Son stated that her confusion episodes and lack of awareness occurred several times in the afternoon. He reports that patient had aphasia as not being able to get her words out at times. When patient seen in consult, son said patient was already much improved over the at home episodes. No seizure like activity or post ictal state. No LOC, syncope, or dizziness. When MD present, patient admits to a HA at home described as sharp pain at her right parietal area. No vision changes.   Patient was just in the hospital for a cardiac stent and has been on the Brilinta and ASA for a couple of days.   O: Current vital signs: BP (!) 141/67   Pulse 72   Temp 97.9 F (36.6 C) (Oral)   Resp (!) 23   SpO2 99%  Vital signs in last 24 hours: Temp:  [97.9 F (36.6 C)-98.1 F (36.7 C)] 97.9 F (36.6 C) (02/20 6045) Pulse Rate:  [66-86] 72 (02/20 0926) Resp:  [16-23] 23 (02/20 0611) BP: (110-150)/(60-113) 141/67 (02/20 0926) SpO2:  [95 %-99 %] 99 % (02/20 0926)  GENERAL: Awake, alert in NAD HEENT: Normocephalic and atraumatic, dry mm LUNGS: Normal respiratory effort.  CV: RRR on tele. No bruits heard.   ABDOMEN: Soft, nontender Ext: warm  NEURO:  Mental Status: AA&Ox3  Speech/Language: speech is without dysarthria.  Naming, repetition, fluency, and comprehension intact.  Cranial Nerves:  II: PERRL. Visual fields full.  III, IV, VI: EOMI. Eyelids elevate symmetrically.  V: Sensation is intact to light touch and symmetrical to face.  VII: Smile is symmetrical. Able to puff cheeks and raise eyebrows.  VIII: hearing intact to voice. IX, X: Palate  elevates symmetrically. Phonation is normal.  WU:[id number] shrug 5/5. XII: tongue is midline without fasciculations. Motor: 5/5 strength to all muscle groups tested.  Tone: is normal and bulk is normal Sensation- Intact to light touch bilaterally.. No extinction to light touch to DSS.   Coordination: FTN intact bilaterally, HKS: no ataxia in BLE. No drift.  DTRs: 2+ throughout Gait- deferred, but NP observed patient sitting up on side of bed without truncal weakness.   Medications  Current Facility-Administered Medications:  .  acetaminophen (TYLENOL) tablet 650 mg, 650 mg, Oral, Q6H PRN **OR** acetaminophen (TYLENOL) suppository 650 mg, 650 mg, Rectal, Q6H PRN, MacNeil, Richard G, DO .  albuterol (PROVENTIL) (2.5 MG/3ML) 0.083% nebulizer solution 2.5 mg, 2.5 mg, Nebulization, Q4H PRN, MacNeil, Richard G, DO .  aspirin EC tablet 81 mg, 81 mg, Oral, Daily, Doran Heater, DO, 81 mg at 09/20/20 8295 .  buPROPion (WELLBUTRIN XL) 24 hr tablet 300 mg, 300 mg, Oral, Daily, MacNeil, Richard G, DO, 300 mg at 09/20/20 6213 .  cholecalciferol (VITAMIN D3) tablet 2,000 Units, 2,000 Units, Oral, Daily, Doran Heater, DO, 2,000 Units at 09/20/20 0865 .  enoxaparin (LOVENOX) injection 40 mg, 40 mg, Subcutaneous, Q24H, MacNeil, Richard G, DO .  levothyroxine (SYNTHROID) tablet 50 mcg, 50 mcg, Oral, Q0600, Doran Heater, DO, 50 mcg at 09/20/20 0630 .  melatonin tablet 10 mg, 10 mg, Oral, QHS PRN, Luna Fuse, Richard G, DO .  metoprolol tartrate (LOPRESSOR) tablet 100 mg, 100 mg, Oral, BID, Doran Heater, DO, 100 mg at 09/20/20 7619 .  ondansetron (ZOFRAN) tablet 4 mg, 4 mg, Oral, Q6H PRN **OR** ondansetron (ZOFRAN) injection 4 mg, 4 mg, Intravenous, Q6H PRN, MacNeil, Richard G, DO .  pravastatin (PRAVACHOL) tablet 40 mg, 40 mg, Oral, QHS, MacNeil, Richard G, DO, 40 mg at 09/19/20 2356 .  ticagrelor (BRILINTA) tablet 90 mg, 90 mg, Oral, BID, Doran Heater, DO, 90 mg at 09/20/20  5093  Current Outpatient Medications:  .  amLODipine (NORVASC) 10 MG tablet, Take 10 mg by mouth daily., Disp: , Rfl:  .  aspirin EC 81 MG tablet, Take 81 mg by mouth daily., Disp: , Rfl:  .  buPROPion (WELLBUTRIN XL) 300 MG 24 hr tablet, Take 300 mg by mouth daily., Disp: , Rfl:  .  cholecalciferol (VITAMIN D) 1000 UNITS tablet, Take 2,000 Units by mouth daily., Disp: , Rfl:  .  glipiZIDE (GLUCOTROL XL) 2.5 MG 24 hr tablet, Take 2.5 mg by mouth daily with breakfast., Disp: , Rfl:  .  levothyroxine (SYNTHROID, LEVOTHROID) 50 MCG tablet, Take 50 mcg by mouth daily before breakfast., Disp: , Rfl:  .  losartan (COZAAR) 50 MG tablet, Take 50 mg by mouth daily., Disp: , Rfl:  .  Melatonin 10 MG TABS, Take 10 mg by mouth at bedtime as needed., Disp: , Rfl:  .  metoprolol tartrate (LOPRESSOR) 100 MG tablet, Take 100 mg by mouth 2 (two) times daily., Disp: , Rfl:  .  montelukast (SINGULAIR) 10 MG tablet, Take 10 mg by mouth daily as needed (allergies)., Disp: , Rfl:  .  pravastatin (PRAVACHOL) 40 MG tablet, Take 40 mg by mouth at bedtime., Disp: , Rfl: 3 .  ticagrelor (BRILINTA) 90 MG TABS tablet, Take 1 tablet (90 mg total) by mouth 2 (two) times daily., Disp: 90 tablet, Rfl: 11  Pertinent Labs Hemoglobin A1c   6.3 on 09/16/20     LDL   79 on 09/16/20     TSH  4.189 on  09/16/20.              Ammonia   21             Imaging MD has reviewed images in epic and the results pertinent to this consultation are:  CT Head Negative for acute abnormalality  MRI/MRA Brain  1. Background pattern of moderate chronic small-vessel ischemic changes of the cerebral hemispheric white matter. 2 punctate acute infarctions in the left occipital lobe and left posterior parietal lobe. No swelling or hemorrhage. 2. No large or medium vessel occlusion or correctable proximal stenosis. Specifically, no abnormality seen in the left posterior cerebral artery. 3. 2 cm region of signal alteration in the left frontal bone.  This is not visible on the CT scan. Question if this represents a metastatic lesion or a benign marrow abnormality. Does the patient have a history of cancer? 4. No carotid bifurcation disease. Antegrade flow present in both vertebral arteries.  Assessment  Her symptoms are completely resolved. Hospitalist is going to keep patient overnight, so we will get an EEG to see if these episodes are related to her stroke. 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 These spells could be seizures related to the new found strokes or perhaps related to the dye she had during stent placement. On her MRI/MRA there is an area of signal alteration in the left frontal bone which ?  Represents a metastatic lesion or a benign marrow abnormality.   Plan:  -her stroke workup is done. Will get EEG.   -she is already on Brilinta and ASA 81mg  po qd as per cardiology s/p stenting -goal LDL is < 70, I increased her dose of pravastatin to 80 mg po qd. She needs a lipid panel in 3 months.  -will need MRI brain repeated out patient for evaluation of metastatic lesion.  -f/up with neurology in clinic in 2. weeks  Pt seen by Clance Boll, MSN, APN-BC/Nurse Practitioner/Neuro and later by MD who will edit note as necessary. Pager: [phone]

## 2020-09-20 NOTE — Progress Notes (Signed)
EEG completed, results pending. 

## 2020-09-20 NOTE — Procedures (Signed)
Patient Name: BRIDIE COLQUHOUN  MRN: [id number]  Epilepsy Attending: Lora Havens  Referring Physician/Provider: Clance Boll, NP Date:  09/20/2020 Duration: 26.06 mins  Patient history: 75 yo F with episodes of confusion, and lack of awareness and aphasia that occurred several times. EEG to evaluate for seizure.  Level of alertness: Awake, asleep  AEDs during EEG study: None  Technical aspects: This EEG study was done with scalp electrodes positioned according to the 10-20 International system of electrode placement. Electrical activity was acquired at a sampling rate of 500Hz  and reviewed with a high frequency filter of 70Hz  and a low frequency filter of 1Hz . EEG data were recorded continuously and digitally stored.   Description: The posterior dominant rhythm consists of 9-10 Hz activity of moderate voltage (25-35 uV) seen predominantly in posterior head regions, symmetric and reactive to eye opening and eye closing. Sleep was characterized by vertex waves, sleep spindles (12 to 14 Hz), maximal frontocentral region.  Hyperventilation and photic stimulation were not performed.     IMPRESSION: This study is within normal limits. No seizures or epileptiform discharges were seen throughout the recording.    Marylen Zuk Barbra Sarks

## 2020-09-20 NOTE — Progress Notes (Signed)
PROGRESS NOTE    Jamie Benjamin  FFM:[id number] DOB: 24-Apr-1946 DOA: 09/19/2020 PCP: Maurice Small, MD   Brief Narrative: 75 year old with PMH significant for CKD stage IIIa, recent NSTEMI with stent placed LAD discharge the morning of 2/19, presents that same night with confusion, memory lapses, episode of sharp headaches temporal area occur several time during the day, stare off episode. She reported she might had some of the episode while she was in the hospital.   She was evaluated by neurology: recommend MRI/MRA; showed two small Punctate acute infarctions left occipital lobe and left posterior parietal lobe.    Assessment & Plan:   Active Problems:   Hypertension   Class 1 obesity due to excess calories with body mass index (BMI) of 33.0 to 33.9 in adult   Prediabetes   CKD (chronic kidney disease), stage III (HCC)   Metabolic encephalopathy   CAD (coronary artery disease)   1- Acute Stroke:  -Presents with memory deficit lapse, was having  trouble following conversation, patient felt she was in tunnel, Patient was not herself. One  stared off episode.  -MRI was positive for Stroke, left temporal and occipital punctate acute infarct.  -Her symptoms has resolved. She feels better, doesn't feel confuse. She was able to ambulate to bathroom and didn't feel dizzy.  -Continue with aspirin and Brilinta.  -Pravachol increase to 80 mg.  -Stroke likely post Cath.  -She had ECHO 2/16: normal Ef fraction.  -recent Hb-A1c; 6.3 -PT, OT evaluation.  -Neurology planning to do EEG now,.  -Work up for infection negative: UA normal, no fever, WBC normal.  -Permissive HTN. Neuro check.   2-HTN;  SBP early this am was at 110/60. Continue with metoprolol. Hold Norvasc and Cozaar.   3-Recent NSTEMI;  Continue with Brilinta, aspirin, statins, metoprolol.  I informed cardiology of patient admission.   4-CKD stage IIIa; Cr stable.   5-Hypothyroidism: Continue with Synthroid.    6-Depression: Continue with Wellbutrin.   7-HLD; increase Pravastatin.   2 cm region signal alteration left frontal Bone; ? Metastatic lesion vs benign bone marrow abnormalities:  Incidental. She will need follow up.    Estimated body mass index is 34.11 kg/m as calculated from the following:   Height as of 09/16/20: 4\' 10"  (1.473 m).   Weight as of an earlier encounter on 09/19/20: 74 kg.   DVT prophylaxis: Lovenox Code Status: Full code Family Communication: care dicussed with son Disposition Plan:  Status is: Observation  The patient remains OBS appropriate and will d/c before 2 midnights.  Dispo: The patient is from: Home              Anticipated d/c is to: Home              Anticipated d/c date is: 2 days              Patient currently is not medically stable to d/c.   Difficult to place patient No        Consultants:   Neurology   Procedures:   None  Antimicrobials:    Subjective: She is alert. She told me she was talking with her sister over phone , sister notice she was off. She was having dizziness on ambulation. She was feeling she was in a tunnel. She was able to sleep couple of hours and is now feeling better. She doesn't feel confuse, feels more clear.  She was having some of this symptoms while she was in the  hospital, but didn't mention anything.   Objective: Vitals:   09/19/20 2351 09/19/20 2356 09/20/20 0348 09/20/20 0611  BP: 131/71 131/71 (!) 125/92 110/60  Pulse: 79 78 73 66  Resp: (!) 21  20 (!) 23  Temp:   98.1 F (36.7 C) 97.9 F (36.6 C)  TempSrc:   Oral Oral  SpO2: 98%  98% 98%   No intake or output data in the 24 hours ending 09/20/20 0750 There were no vitals filed for this visit.  Examination:  General exam: Appears calm and comfortable  Respiratory system: Clear to auscultation. Respiratory effort normal. Cardiovascular system: S1 & S2 heard, RRR. No JVD, murmurs, rubs, gallops or clicks. No pedal  edema. Gastrointestinal system: Abdomen is nondistended, soft and nontender. No organomegaly or masses felt. Normal bowel sounds heard. Central nervous system: Alert and oriented. No focal neurological deficits. Extremities: Symmetric 5 x 5 power. Skin: No rashes, lesions or ulcers Psychiatry: Judgement and insight appear normal. Mood & affect appropriate.     Data Reviewed: I have personally reviewed following labs and imaging studies  CBC: Recent Labs  Lab 09/16/20 0431 09/17/20 0427 09/18/20 0140 09/19/20 1848 09/20/20 0344  WBC 8.9 8.0 10.1 8.5 7.9  NEUTROABS 6.3  --   --  5.6  --   HGB 16.0* 13.8 14.2 14.6 14.3  HCT 48.5* 39.4 41.9 44.3 40.8  MCV 91.9 90.2 89.9 92.1 90.5  PLT 329 273 265 278 786   Basic Metabolic Panel: Recent Labs  Lab 09/18/20 0140 09/18/20 0942 09/19/20 0307 09/19/20 1848 09/20/20 0344  NA 139 137 143 138 140  K 3.7 4.0 3.8 4.1 3.7  CL 104 102 109 104 107  CO2 24 22 23  21* 22  GLUCOSE 134* 247* 106* 136* 102*  BUN 24* 19 18 25* 19  CREATININE 1.44* 1.55* 1.26* 1.33* 1.23*  CALCIUM 9.0 9.5 9.2 10.0 9.6   GFR: Estimated Creatinine Clearance: 33.8 mL/min (A) (by C-G formula based on SCr of 1.23 mg/dL (H)). Liver Function Tests: Recent Labs  Lab 09/16/20 0431 09/19/20 1848 09/20/20 0344  AST 37 28 24  ALT 36 25 24  ALKPHOS 80 76 69  BILITOT 1.3* 0.5 1.2  PROT 7.3 7.3 6.4*  ALBUMIN 4.1 4.0 3.5   No results for input(s): LIPASE, AMYLASE in the last 168 hours. Recent Labs  Lab 09/19/20 0032  AMMONIA 21   Coagulation Profile: Recent Labs  Lab 09/16/20 0431 09/19/20 1848  INR 1.0 1.0   Cardiac Enzymes: No results for input(s): CKTOTAL, CKMB, CKMBINDEX, TROPONINI in the last 168 hours. BNP (last 3 results) No results for input(s): PROBNP in the last 8760 hours. HbA1C: No results for input(s): HGBA1C in the last 72 hours. CBG: Recent Labs  Lab 09/18/20 1620 09/18/20 1746 09/18/20 2059 09/19/20 0806 09/19/20 1837   GLUCAP 67* 106* 159* 97 122*   Lipid Profile: No results for input(s): CHOL, HDL, LDLCALC, TRIG, CHOLHDL, LDLDIRECT in the last 72 hours. Thyroid Function Tests: No results for input(s): TSH, T4TOTAL, FREET4, T3FREE, THYROIDAB in the last 72 hours. Anemia Panel: No results for input(s): VITAMINB12, FOLATE, FERRITIN, TIBC, IRON, RETICCTPCT in the last 72 hours. Sepsis Labs: No results for input(s): PROCALCITON, LATICACIDVEN in the last 168 hours.  Recent Results (from the past 240 hour(s))  Resp Panel by RT-PCR (Flu A&B, Covid) Nasopharyngeal Swab     Status: None   Collection Time: 09/16/20  4:40 AM   Specimen: Nasopharyngeal Swab; Nasopharyngeal(NP) swabs in vial transport medium  Result Value Ref Range Status   SARS Coronavirus 2 by RT PCR NEGATIVE NEGATIVE Final    Comment: (NOTE) SARS-CoV-2 target nucleic acids are NOT DETECTED.  The SARS-CoV-2 RNA is generally detectable in upper respiratory specimens during the acute phase of infection. The lowest concentration of SARS-CoV-2 viral copies this assay can detect is 138 copies/mL. A negative result does not preclude SARS-Cov-2 infection and should not be used as the sole basis for treatment or other patient management decisions. A negative result may occur with  improper specimen collection/handling, submission of specimen other than nasopharyngeal swab, presence of viral mutation(s) within the areas targeted by this assay, and inadequate number of viral copies(<138 copies/mL). A negative result must be combined with clinical observations, patient history, and epidemiological information. The expected result is Negative.  Fact Sheet for Patients:  EntrepreneurPulse.com.au  Fact Sheet for Healthcare Providers:  IncredibleEmployment.be  This test is no t yet approved or cleared by the [place] FDA and  has been authorized for detection and/or diagnosis of SARS-CoV-2 by FDA under an  Emergency Use Authorization (EUA). This EUA will remain  in effect (meaning this test can be used) for the duration of the COVID-19 declaration under Section 564(b)(1) of the Act, 21 U.S.C.section 360bbb-3(b)(1), unless the authorization is terminated  or revoked sooner.       Influenza A by PCR NEGATIVE NEGATIVE Final   Influenza B by PCR NEGATIVE NEGATIVE Final    Comment: (NOTE) The Xpert Xpress SARS-CoV-2/FLU/RSV plus assay is intended as an aid in the diagnosis of influenza from Nasopharyngeal swab specimens and should not be used as a sole basis for treatment. Nasal washings and aspirates are unacceptable for Xpert Xpress SARS-CoV-2/FLU/RSV testing.  Fact Sheet for Patients: EntrepreneurPulse.com.au  Fact Sheet for Healthcare Providers: IncredibleEmployment.be  This test is not yet approved or cleared by the [place] FDA and has been authorized for detection and/or diagnosis of SARS-CoV-2 by FDA under an Emergency Use Authorization (EUA). This EUA will remain in effect (meaning this test can be used) for the duration of the COVID-19 declaration under Section 564(b)(1) of the Act, 21 U.S.C. section 360bbb-3(b)(1), unless the authorization is terminated or revoked.  Performed at [facility], [place] [address] [place], [place] [address]   SARS CORONAVIRUS 2 (TAT 6-24 HRS) Nasopharyngeal Nasopharyngeal Swab     Status: None   Collection Time: 09/19/20 10:03 PM   Specimen: Nasopharyngeal Swab  Result Value Ref Range Status   SARS Coronavirus 2 NEGATIVE NEGATIVE Final    Comment: (NOTE) SARS-CoV-2 target nucleic acids are NOT DETECTED.  The SARS-CoV-2 RNA is generally detectable in upper and lower respiratory specimens during the acute phase of infection. Negative results do not preclude SARS-CoV-2 infection, do not rule out co-infections with other pathogens, and should not be used as the sole basis for treatment or other  patient management decisions. Negative results must be combined with clinical observations, patient history, and epidemiological information. The expected result is Negative.  Fact Sheet for Patients: SugarRoll.be  Fact Sheet for Healthcare Providers: https://www.woods-mathews.com/  This test is not yet approved or cleared by the [place] FDA and  has been authorized for detection and/or diagnosis of SARS-CoV-2 by FDA under an Emergency Use Authorization (EUA). This EUA will remain  in effect (meaning this test can be used) for the duration of the COVID-19 declaration under Se ction 564(b)(1) of the Act, 21 U.S.C. section 360bbb-3(b)(1), unless the authorization is terminated or revoked sooner.  Performed  at [facility], [place] [address], [place], [place] [address]          Radiology Studies: CT HEAD WO CONTRAST  Result Date: 09/19/2020 CLINICAL DATA:  Altered mental status. EXAM: CT HEAD WITHOUT CONTRAST TECHNIQUE: Contiguous axial images were obtained from the base of the skull through the vertex without intravenous contrast. COMPARISON:  None. FINDINGS: Brain: There is mild cerebral atrophy with widening of the extra-axial spaces and ventricular dilatation. There are areas of decreased attenuation within the white matter tracts of the supratentorial brain, consistent with microvascular disease changes. Vascular: No hyperdense vessel or unexpected calcification. Skull: Negative for acute fracture or focal lesion. Hyperostosis frontalis is seen. Sinuses/Orbits: No acute finding. Other: None. IMPRESSION: No acute intracranial abnormality. Electronically Signed   By: Virgina Norfolk M.D.   On: 09/19/2020 19:18   MR ANGIO HEAD WO CONTRAST  Result Date: 09/20/2020 CLINICAL DATA:  Transient ischemic attack.  Altered mental status. EXAM: MRI HEAD WITHOUT CONTRAST MRA HEAD WITHOUT CONTRAST MRA NECK WITHOUT CONTRAST TECHNIQUE:  Multiplanar, multiecho pulse sequences of the brain and surrounding structures were obtained without intravenous contrast. Angiographic images of the Circle of Willis were obtained using MRA technique without intravenous contrast. Angiographic images of the neck were obtained using MRA technique without intravenous contrast. Carotid stenosis measurements (when applicable) are obtained utilizing NASCET criteria, using the distal internal carotid diameter as the denominator. COMPARISON:  Head CT earlier same day. FINDINGS: MRI HEAD FINDINGS Brain: Diffusion imaging shows a punctate acute infarction in the left occipital lobe and a second small focus cortical infarction in the left posterior parietal lobe. Elsewhere, the brainstem and cerebellum are normal. Cerebral hemispheres otherwise show moderate chronic small-vessel ischemic changes of the white matter. No large vessel territory infarction. No mass lesion, hemorrhage, hydrocephalus or extra-axial collection. Vascular: Major vessels at the base of the brain show flow. Skull and upper cervical spine: 2 cm region of signal alteration in the left frontal bone with low T1 signal and increased T2, FLAIR and diffusion signal. This is not visible on the CT. Sinuses/Orbits: Inflammatory changes of the left division of the sphenoid sinus. Other paranasal sinuses are clear. Orbits are negative. Other: None MRA HEAD FINDINGS Both internal carotid arteries are patent through the skull base and siphon regions. The anterior and middle cerebral vessels are patent without proximal stenosis, aneurysm or vascular malformation. No large or medium vessel occlusion is seen. Both vertebral arteries are widely patent to the basilar. No basilar stenosis. Both posteroinferior cerebellar arteries show flow. Both superior cerebellar arteries show flow. Both posterior cerebral arteries are patent. Specifically, no pathology identified on the left. MRA NECK FINDINGS Both common carotid  arteries are patent. Both carotid bifurcations appear normal without stenosis or significant irregularity. Antegrade flow present in both vertebral arteries. IMPRESSION: 1. Background pattern of moderate chronic small-vessel ischemic changes of the cerebral hemispheric white matter. 2 punctate acute infarctions in the left occipital lobe and left posterior parietal lobe. No swelling or hemorrhage. 2. No large or medium vessel occlusion or correctable proximal stenosis. Specifically, no abnormality seen in the left posterior cerebral artery. 3. 2 cm region of signal alteration in the left frontal bone. This is not visible on the CT scan. Question if this represents a metastatic lesion or a benign marrow abnormality. Does the patient have a history of cancer? 4. No carotid bifurcation disease. Antegrade flow present in both vertebral arteries. Electronically Signed   By: Nelson Chimes M.D.   On: 09/20/2020  00:12   MR ANGIO NECK WO CONTRAST  Result Date: 09/20/2020 CLINICAL DATA:  Transient ischemic attack.  Altered mental status. EXAM: MRI HEAD WITHOUT CONTRAST MRA HEAD WITHOUT CONTRAST MRA NECK WITHOUT CONTRAST TECHNIQUE: Multiplanar, multiecho pulse sequences of the brain and surrounding structures were obtained without intravenous contrast. Angiographic images of the Circle of Willis were obtained using MRA technique without intravenous contrast. Angiographic images of the neck were obtained using MRA technique without intravenous contrast. Carotid stenosis measurements (when applicable) are obtained utilizing NASCET criteria, using the distal internal carotid diameter as the denominator. COMPARISON:  Head CT earlier same day. FINDINGS: MRI HEAD FINDINGS Brain: Diffusion imaging shows a punctate acute infarction in the left occipital lobe and a second small focus cortical infarction in the left posterior parietal lobe. Elsewhere, the brainstem and cerebellum are normal. Cerebral hemispheres otherwise show  moderate chronic small-vessel ischemic changes of the white matter. No large vessel territory infarction. No mass lesion, hemorrhage, hydrocephalus or extra-axial collection. Vascular: Major vessels at the base of the brain show flow. Skull and upper cervical spine: 2 cm region of signal alteration in the left frontal bone with low T1 signal and increased T2, FLAIR and diffusion signal. This is not visible on the CT. Sinuses/Orbits: Inflammatory changes of the left division of the sphenoid sinus. Other paranasal sinuses are clear. Orbits are negative. Other: None MRA HEAD FINDINGS Both internal carotid arteries are patent through the skull base and siphon regions. The anterior and middle cerebral vessels are patent without proximal stenosis, aneurysm or vascular malformation. No large or medium vessel occlusion is seen. Both vertebral arteries are widely patent to the basilar. No basilar stenosis. Both posteroinferior cerebellar arteries show flow. Both superior cerebellar arteries show flow. Both posterior cerebral arteries are patent. Specifically, no pathology identified on the left. MRA NECK FINDINGS Both common carotid arteries are patent. Both carotid bifurcations appear normal without stenosis or significant irregularity. Antegrade flow present in both vertebral arteries. IMPRESSION: 1. Background pattern of moderate chronic small-vessel ischemic changes of the cerebral hemispheric white matter. 2 punctate acute infarctions in the left occipital lobe and left posterior parietal lobe. No swelling or hemorrhage. 2. No large or medium vessel occlusion or correctable proximal stenosis. Specifically, no abnormality seen in the left posterior cerebral artery. 3. 2 cm region of signal alteration in the left frontal bone. This is not visible on the CT scan. Question if this represents a metastatic lesion or a benign marrow abnormality. Does the patient have a history of cancer? 4. No carotid bifurcation disease.  Antegrade flow present in both vertebral arteries. Electronically Signed   By: Nelson Chimes M.D.   On: 09/20/2020 00:12   MR BRAIN WO CONTRAST  Result Date: 09/20/2020 CLINICAL DATA:  Transient ischemic attack.  Altered mental status. EXAM: MRI HEAD WITHOUT CONTRAST MRA HEAD WITHOUT CONTRAST MRA NECK WITHOUT CONTRAST TECHNIQUE: Multiplanar, multiecho pulse sequences of the brain and surrounding structures were obtained without intravenous contrast. Angiographic images of the Circle of Willis were obtained using MRA technique without intravenous contrast. Angiographic images of the neck were obtained using MRA technique without intravenous contrast. Carotid stenosis measurements (when applicable) are obtained utilizing NASCET criteria, using the distal internal carotid diameter as the denominator. COMPARISON:  Head CT earlier same day. FINDINGS: MRI HEAD FINDINGS Brain: Diffusion imaging shows a punctate acute infarction in the left occipital lobe and a second small focus cortical infarction in the left posterior parietal lobe. Elsewhere, the brainstem and cerebellum are normal. Cerebral  hemispheres otherwise show moderate chronic small-vessel ischemic changes of the white matter. No large vessel territory infarction. No mass lesion, hemorrhage, hydrocephalus or extra-axial collection. Vascular: Major vessels at the base of the brain show flow. Skull and upper cervical spine: 2 cm region of signal alteration in the left frontal bone with low T1 signal and increased T2, FLAIR and diffusion signal. This is not visible on the CT. Sinuses/Orbits: Inflammatory changes of the left division of the sphenoid sinus. Other paranasal sinuses are clear. Orbits are negative. Other: None MRA HEAD FINDINGS Both internal carotid arteries are patent through the skull base and siphon regions. The anterior and middle cerebral vessels are patent without proximal stenosis, aneurysm or vascular malformation. No large or medium vessel  occlusion is seen. Both vertebral arteries are widely patent to the basilar. No basilar stenosis. Both posteroinferior cerebellar arteries show flow. Both superior cerebellar arteries show flow. Both posterior cerebral arteries are patent. Specifically, no pathology identified on the left. MRA NECK FINDINGS Both common carotid arteries are patent. Both carotid bifurcations appear normal without stenosis or significant irregularity. Antegrade flow present in both vertebral arteries. IMPRESSION: 1. Background pattern of moderate chronic small-vessel ischemic changes of the cerebral hemispheric white matter. 2 punctate acute infarctions in the left occipital lobe and left posterior parietal lobe. No swelling or hemorrhage. 2. No large or medium vessel occlusion or correctable proximal stenosis. Specifically, no abnormality seen in the left posterior cerebral artery. 3. 2 cm region of signal alteration in the left frontal bone. This is not visible on the CT scan. Question if this represents a metastatic lesion or a benign marrow abnormality. Does the patient have a history of cancer? 4. No carotid bifurcation disease. Antegrade flow present in both vertebral arteries. Electronically Signed   By: Nelson Chimes M.D.   On: 09/20/2020 00:12        Scheduled Meds: . aspirin EC  81 mg Oral Daily  . buPROPion  300 mg Oral Daily  . cholecalciferol  2,000 Units Oral Daily  . enoxaparin (LOVENOX) injection  40 mg Subcutaneous Q24H  . levothyroxine  50 mcg Oral Q0600  . metoprolol tartrate  100 mg Oral BID  . pravastatin  40 mg Oral QHS  . ticagrelor  90 mg Oral BID   Continuous Infusions:   LOS: 0 days    Time spent: 35 minutes.     Elmarie Shiley, MD [facility]   If 7PM-7AM, please contact night-coverage [email or web address]  09/20/2020, 7:50 AM

## 2020-09-21 DIAGNOSIS — Z6833 Body mass index (BMI) 33.0-33.9, adult: Secondary | ICD-10-CM

## 2020-09-21 DIAGNOSIS — E6609 Other obesity due to excess calories: Secondary | ICD-10-CM

## 2020-09-21 DIAGNOSIS — I2510 Atherosclerotic heart disease of native coronary artery without angina pectoris: Secondary | ICD-10-CM | POA: Diagnosis not present

## 2020-09-21 DIAGNOSIS — N1832 Chronic kidney disease, stage 3b: Secondary | ICD-10-CM | POA: Diagnosis not present

## 2020-09-21 DIAGNOSIS — R7303 Prediabetes: Secondary | ICD-10-CM

## 2020-09-21 DIAGNOSIS — I10 Essential (primary) hypertension: Secondary | ICD-10-CM

## 2020-09-21 LAB — GLUCOSE, CAPILLARY
Glucose-Capillary: 106 mg/dL — ABNORMAL HIGH (ref 70–99)
Glucose-Capillary: 113 mg/dL — ABNORMAL HIGH (ref 70–99)

## 2020-09-21 MED ORDER — PRAVASTATIN SODIUM 80 MG PO TABS: 80.0000 mg | ORAL_TABLET | Freq: Every day | ORAL | 2 refills | Status: DC

## 2020-09-21 MED ORDER — NITROGLYCERIN 0.4 MG SL SUBL: 0.4000 mg | SUBLINGUAL_TABLET | SUBLINGUAL | 0 refills | Status: AC | PRN

## 2020-09-21 NOTE — Progress Notes (Signed)
Nsg Discharge Note  Admit Date:  09/19/2020 Discharge date: 09/21/2020   Maureen Chatters to be D/C'd Home per MD order.  AVS completed.  Copy for chart, and copy for patient signed, and dated. Patient/caregiver able to verbalize understanding.  Discharge Medication: Allergies as of 09/21/2020      Reactions   Influenza Virus Vaccine Other (See Comments)   Lisinopril Cough      Medication List    TAKE these medications   amLODipine 10 MG tablet Commonly known as: NORVASC Take 10 mg by mouth daily.   aspirin EC 81 MG tablet Take 81 mg by mouth daily.   buPROPion 300 MG 24 hr tablet Commonly known as: WELLBUTRIN XL Take 300 mg by mouth daily.   cholecalciferol 1000 units tablet Commonly known as: VITAMIN D Take 2,000 Units by mouth daily.   glipiZIDE 2.5 MG 24 hr tablet Commonly known as: GLUCOTROL XL Take 2.5 mg by mouth daily with breakfast.   levothyroxine 50 MCG tablet Commonly known as: SYNTHROID Take 50 mcg by mouth daily before breakfast.   losartan 50 MG tablet Commonly known as: COZAAR Take 50 mg by mouth daily.   Melatonin 10 MG Tabs Take 10 mg by mouth at bedtime as needed.   metoprolol tartrate 100 MG tablet Commonly known as: LOPRESSOR Take 100 mg by mouth 2 (two) times daily.   montelukast 10 MG tablet Commonly known as: SINGULAIR Take 10 mg by mouth daily as needed (allergies).   pravastatin 80 MG tablet Commonly known as: PRAVACHOL Take 1 tablet (80 mg total) by mouth at bedtime. What changed:   medication strength  how much to take   ticagrelor 90 MG Tabs tablet Commonly known as: BRILINTA Take 1 tablet (90 mg total) by mouth 2 (two) times daily.       Discharge Assessment: Vitals:   09/21/20 1039 09/21/20 1206  BP: 139/81 137/74  Pulse: 67 63  Resp:  18  Temp:  97.8 F (36.6 C)  SpO2:  99%   Skin clean, dry and intact without evidence of skin break down, no evidence of skin tears noted. IV catheter discontinued intact. Site  without signs and symptoms of complications - no redness or edema noted at insertion site, patient denies c/o pain - only slight tenderness at site.  Dressing with slight pressure applied.  D/c Instructions-Education: Discharge instructions given to patient/family with verbalized understanding. D/c education completed with patient/family including follow up instructions, medication list, d/c activities limitations if indicated, with other d/c instructions as indicated by MD - patient able to verbalize understanding, all questions fully answered. Patient instructed to return to ED, call 911, or call MD for any changes in condition.  Patient escorted via [place], and D/C home via private auto.  Erasmo Leventhal, RN 09/21/2020 1:27 PM

## 2020-09-21 NOTE — Discharge Summary (Signed)
Physician Discharge Summary  Jamie Benjamin WJX:[id number] DOB: [birth year] DOA: 09/19/2020  PCP: Maurice Small, MD  Admit date: 09/19/2020 Discharge date: 09/21/2020  Admitted From: Home  Discharge disposition: home   Recommendations for Outpatient Follow-Up:   . Follow up with your primary care provider in one week.  . Check CBC, BMP, magnesium in the next visit . Follow-up with [facility] as per the clinic. Marland Kitchen Please follow-up findings on the frontal bone as per the imaging could be incidental.  Discharge Diagnosis:   Active Problems:   Hypertension   Class 1 obesity due to excess calories with body mass index (BMI) of 33.0 to 33.9 in adult   Prediabetes   CKD (chronic kidney disease), stage III (HCC)   Metabolic encephalopathy   CAD (coronary artery disease)   Confusion   Discharge Condition: Improved.  Diet recommendation: Low sodium, heart healthy.   Wound care: None.  Code status: Full.   History of Present Illness:   75 year old female with PMH significant for CKD stage IIIa, recent NSTEMI with stent placed in the LAD and discharged the morning of 2/19, presented to the hospital in the same night with confusion, memory lapses, headache.  Patient was seen by neurology who recommended MRI MRA which showed a 2 small punctuate acute infarct in the left occipital and posterior parietal lobe.  Patient was then admitted to hospital for further evaluation and treatment.    Hospital Course:   Following conditions were addressed during hospitalization as listed below,  Acute ischemic stroke:  Patient presented with a memory deficit problem with conversation and very congested.  MRI was positive for stroke.  At this time patient is at her baseline.  Neurology has seen the patient and recommend to continue aspirin Brilinta.  Statin dose has been increased to 80 mg.  Stroke likely status post cath.  She did have a 2D echocardiogram on 09/16/2020 with normal  LV function recent A1c of 6.3.  Patient was seen by physical therapy optimal therapy who recommended no skilled therapy needs on discharge.  EEG was unremarkable.  Essential HTN;  Patient will resume metoprolol Norvasc and Cozaar on discharge.    Recent NSTEMI;  Continue with Brilinta, aspirin, statins, metoprolol.  No acute issues at this time.  CKD stage IIIa; Creatinine remained stable.  Hypothyroidism: Continue with Synthroid.   Depression: Continue with Wellbutrin.   Hyperlipidemia  increased dose of pravastatin on discharge.    2 cm region signal alteration left frontal Bone; possible Metastatic lesion vs benign bone marrow abnormalities:  Incidental. She will need follow up.   Disposition.  At this time, patient is stable for disposition home with outpatient PCP follow-up.  Spoke with the patient's son Mr. Harrell Gave on the phone prior to discharge.  Medical Consultants:     Neurology   Procedures:    EEG Subjective:   Today, patient was seen and examined at bedside.  Denies any dizziness, lightheadedness, shortness of breath focal weakness.  Wants to go home.  Discharge Exam:   Vitals:   09/21/20 1039 09/21/20 1206  BP: 139/81 137/74  Pulse: 67 63  Resp:  18  Temp:  97.8 F (36.6 C)  SpO2:  99%   Vitals:   09/21/20 0429 09/21/20 0822 09/21/20 1039 09/21/20 1206  BP: 118/61 134/88 139/81 137/74  Pulse: 63 71 67 63  Resp: 17 18  18   Temp: 97.9 F (36.6 C) 97.9 F (36.6 C)  97.8 F (36.6 C)  TempSrc: Oral Oral  Oral  SpO2: 97% 95%  99%   General: Alert awake, not in obvious distress HENT: pupils equally reacting to light,  No scleral pallor or icterus noted. Oral mucosa is moist.  Chest:  Clear breath sounds.  Diminished breath sounds bilaterally. No crackles or wheezes.  CVS: S1 &S2 heard. No murmur.  Regular rate and rhythm. Abdomen: Soft, nontender, nondistended.  Bowel sounds are heard.   Extremities: No cyanosis, clubbing or edema.   Peripheral pulses are palpable. Psych: Alert, awake and oriented, normal mood CNS:  No cranial nerve deficits.  Power equal in all extremities.   Skin: Warm and dry.  No rashes noted.  The results of significant diagnostics from this hospitalization (including imaging, microbiology, ancillary and laboratory) are listed below for reference.     Diagnostic Studies:   CT HEAD WO CONTRAST  Result Date: 09/19/2020 CLINICAL DATA:  Altered mental status. EXAM: CT HEAD WITHOUT CONTRAST TECHNIQUE: Contiguous axial images were obtained from the base of the skull through the vertex without intravenous contrast. COMPARISON:  None. FINDINGS: Brain: There is mild cerebral atrophy with widening of the extra-axial spaces and ventricular dilatation. There are areas of decreased attenuation within the white matter tracts of the supratentorial brain, consistent with microvascular disease changes. Vascular: No hyperdense vessel or unexpected calcification. Skull: Negative for acute fracture or focal lesion. Hyperostosis frontalis is seen. Sinuses/Orbits: No acute finding. Other: None. IMPRESSION: No acute intracranial abnormality. Electronically Signed   By: Virgina Norfolk M.D.   On: 09/19/2020 19:18   MR ANGIO HEAD WO CONTRAST  Result Date: 09/20/2020 CLINICAL DATA:  Transient ischemic attack.  Altered mental status. EXAM: MRI HEAD WITHOUT CONTRAST MRA HEAD WITHOUT CONTRAST MRA NECK WITHOUT CONTRAST TECHNIQUE: Multiplanar, multiecho pulse sequences of the brain and surrounding structures were obtained without intravenous contrast. Angiographic images of the Circle of Willis were obtained using MRA technique without intravenous contrast. Angiographic images of the neck were obtained using MRA technique without intravenous contrast. Carotid stenosis measurements (when applicable) are obtained utilizing NASCET criteria, using the distal internal carotid diameter as the denominator. COMPARISON:  Head CT earlier same  day. FINDINGS: MRI HEAD FINDINGS Brain: Diffusion imaging shows a punctate acute infarction in the left occipital lobe and a second small focus cortical infarction in the left posterior parietal lobe. Elsewhere, the brainstem and cerebellum are normal. Cerebral hemispheres otherwise show moderate chronic small-vessel ischemic changes of the white matter. No large vessel territory infarction. No mass lesion, hemorrhage, hydrocephalus or extra-axial collection. Vascular: Major vessels at the base of the brain show flow. Skull and upper cervical spine: 2 cm region of signal alteration in the left frontal bone with low T1 signal and increased T2, FLAIR and diffusion signal. This is not visible on the CT. Sinuses/Orbits: Inflammatory changes of the left division of the sphenoid sinus. Other paranasal sinuses are clear. Orbits are negative. Other: None MRA HEAD FINDINGS Both internal carotid arteries are patent through the skull base and siphon regions. The anterior and middle cerebral vessels are patent without proximal stenosis, aneurysm or vascular malformation. No large or medium vessel occlusion is seen. Both vertebral arteries are widely patent to the basilar. No basilar stenosis. Both posteroinferior cerebellar arteries show flow. Both superior cerebellar arteries show flow. Both posterior cerebral arteries are patent. Specifically, no pathology identified on the left. MRA NECK FINDINGS Both common carotid arteries are patent. Both carotid bifurcations appear normal without stenosis or significant irregularity. Antegrade flow present in both vertebral arteries.  IMPRESSION: 1. Background pattern of moderate chronic small-vessel ischemic changes of the cerebral hemispheric white matter. 2 punctate acute infarctions in the left occipital lobe and left posterior parietal lobe. No swelling or hemorrhage. 2. No large or medium vessel occlusion or correctable proximal stenosis. Specifically, no abnormality seen in the  left posterior cerebral artery. 3. 2 cm region of signal alteration in the left frontal bone. This is not visible on the CT scan. Question if this represents a metastatic lesion or a benign marrow abnormality. Does the patient have a history of cancer? 4. No carotid bifurcation disease. Antegrade flow present in both vertebral arteries. Electronically Signed   By: Nelson Chimes M.D.   On: 09/20/2020 00:12   MR ANGIO NECK WO CONTRAST  Result Date: 09/20/2020 CLINICAL DATA:  Transient ischemic attack.  Altered mental status. EXAM: MRI HEAD WITHOUT CONTRAST MRA HEAD WITHOUT CONTRAST MRA NECK WITHOUT CONTRAST TECHNIQUE: Multiplanar, multiecho pulse sequences of the brain and surrounding structures were obtained without intravenous contrast. Angiographic images of the Circle of Willis were obtained using MRA technique without intravenous contrast. Angiographic images of the neck were obtained using MRA technique without intravenous contrast. Carotid stenosis measurements (when applicable) are obtained utilizing NASCET criteria, using the distal internal carotid diameter as the denominator. COMPARISON:  Head CT earlier same day. FINDINGS: MRI HEAD FINDINGS Brain: Diffusion imaging shows a punctate acute infarction in the left occipital lobe and a second small focus cortical infarction in the left posterior parietal lobe. Elsewhere, the brainstem and cerebellum are normal. Cerebral hemispheres otherwise show moderate chronic small-vessel ischemic changes of the white matter. No large vessel territory infarction. No mass lesion, hemorrhage, hydrocephalus or extra-axial collection. Vascular: Major vessels at the base of the brain show flow. Skull and upper cervical spine: 2 cm region of signal alteration in the left frontal bone with low T1 signal and increased T2, FLAIR and diffusion signal. This is not visible on the CT. Sinuses/Orbits: Inflammatory changes of the left division of the sphenoid sinus. Other paranasal  sinuses are clear. Orbits are negative. Other: None MRA HEAD FINDINGS Both internal carotid arteries are patent through the skull base and siphon regions. The anterior and middle cerebral vessels are patent without proximal stenosis, aneurysm or vascular malformation. No large or medium vessel occlusion is seen. Both vertebral arteries are widely patent to the basilar. No basilar stenosis. Both posteroinferior cerebellar arteries show flow. Both superior cerebellar arteries show flow. Both posterior cerebral arteries are patent. Specifically, no pathology identified on the left. MRA NECK FINDINGS Both common carotid arteries are patent. Both carotid bifurcations appear normal without stenosis or significant irregularity. Antegrade flow present in both vertebral arteries. IMPRESSION: 1. Background pattern of moderate chronic small-vessel ischemic changes of the cerebral hemispheric white matter. 2 punctate acute infarctions in the left occipital lobe and left posterior parietal lobe. No swelling or hemorrhage. 2. No large or medium vessel occlusion or correctable proximal stenosis. Specifically, no abnormality seen in the left posterior cerebral artery. 3. 2 cm region of signal alteration in the left frontal bone. This is not visible on the CT scan. Question if this represents a metastatic lesion or a benign marrow abnormality. Does the patient have a history of cancer? 4. No carotid bifurcation disease. Antegrade flow present in both vertebral arteries. Electronically Signed   By: Nelson Chimes M.D.   On: 09/20/2020 00:12   MR BRAIN WO CONTRAST  Result Date: 09/20/2020 CLINICAL DATA:  Transient ischemic attack.  Altered mental status. EXAM:  MRI HEAD WITHOUT CONTRAST MRA HEAD WITHOUT CONTRAST MRA NECK WITHOUT CONTRAST TECHNIQUE: Multiplanar, multiecho pulse sequences of the brain and surrounding structures were obtained without intravenous contrast. Angiographic images of the Circle of Willis were obtained using  MRA technique without intravenous contrast. Angiographic images of the neck were obtained using MRA technique without intravenous contrast. Carotid stenosis measurements (when applicable) are obtained utilizing NASCET criteria, using the distal internal carotid diameter as the denominator. COMPARISON:  Head CT earlier same day. FINDINGS: MRI HEAD FINDINGS Brain: Diffusion imaging shows a punctate acute infarction in the left occipital lobe and a second small focus cortical infarction in the left posterior parietal lobe. Elsewhere, the brainstem and cerebellum are normal. Cerebral hemispheres otherwise show moderate chronic small-vessel ischemic changes of the white matter. No large vessel territory infarction. No mass lesion, hemorrhage, hydrocephalus or extra-axial collection. Vascular: Major vessels at the base of the brain show flow. Skull and upper cervical spine: 2 cm region of signal alteration in the left frontal bone with low T1 signal and increased T2, FLAIR and diffusion signal. This is not visible on the CT. Sinuses/Orbits: Inflammatory changes of the left division of the sphenoid sinus. Other paranasal sinuses are clear. Orbits are negative. Other: None MRA HEAD FINDINGS Both internal carotid arteries are patent through the skull base and siphon regions. The anterior and middle cerebral vessels are patent without proximal stenosis, aneurysm or vascular malformation. No large or medium vessel occlusion is seen. Both vertebral arteries are widely patent to the basilar. No basilar stenosis. Both posteroinferior cerebellar arteries show flow. Both superior cerebellar arteries show flow. Both posterior cerebral arteries are patent. Specifically, no pathology identified on the left. MRA NECK FINDINGS Both common carotid arteries are patent. Both carotid bifurcations appear normal without stenosis or significant irregularity. Antegrade flow present in both vertebral arteries. IMPRESSION: 1. Background pattern  of moderate chronic small-vessel ischemic changes of the cerebral hemispheric white matter. 2 punctate acute infarctions in the left occipital lobe and left posterior parietal lobe. No swelling or hemorrhage. 2. No large or medium vessel occlusion or correctable proximal stenosis. Specifically, no abnormality seen in the left posterior cerebral artery. 3. 2 cm region of signal alteration in the left frontal bone. This is not visible on the CT scan. Question if this represents a metastatic lesion or a benign marrow abnormality. Does the patient have a history of cancer? 4. No carotid bifurcation disease. Antegrade flow present in both vertebral arteries. Electronically Signed   By: Nelson Chimes M.D.   On: 09/20/2020 00:12   EEG adult  Result Date: 09/20/2020 Lora Havens, MD     09/20/2020  3:58 PM Patient Name: Jamie Benjamin MRN: [id number] Epilepsy Attending: Lora Havens Referring Physician/Provider: Clance Boll, NP Date:  09/20/2020 Duration: 26.06 mins Patient history: 75 yo F with episodes of confusion, and lack of awareness and aphasia that occurred several times. EEG to evaluate for seizure. Level of alertness: Awake, asleep AEDs during EEG study: None Technical aspects: This EEG study was done with scalp electrodes positioned according to the 10-20 International system of electrode placement. Electrical activity was acquired at a sampling rate of 500Hz  and reviewed with a high frequency filter of 70Hz  and a low frequency filter of 1Hz . EEG data were recorded continuously and digitally stored. Description: The posterior dominant rhythm consists of 9-10 Hz activity of moderate voltage (25-35 uV) seen predominantly in posterior head regions, symmetric and reactive to eye opening and eye closing. Sleep was characterized  by vertex waves, sleep spindles (12 to 14 Hz), maximal frontocentral region.  Hyperventilation and photic stimulation were not performed.   IMPRESSION: This study is within  normal limits. No seizures or epileptiform discharges were seen throughout the recording. Lora Havens     Labs:   Basic Metabolic Panel: Recent Labs  Lab 09/18/20 0140 09/18/20 0942 09/19/20 0307 09/19/20 1848 09/20/20 0344  NA 139 137 143 138 140  K 3.7 4.0 3.8 4.1 3.7  CL 104 102 109 104 107  CO2 24 22 23  21* 22  GLUCOSE 134* 247* 106* 136* 102*  BUN 24* 19 18 25* 19  CREATININE 1.44* 1.55* 1.26* 1.33* 1.23*  CALCIUM 9.0 9.5 9.2 10.0 9.6   GFR Estimated Creatinine Clearance: 33.8 mL/min (A) (by C-G formula based on SCr of 1.23 mg/dL (H)). Liver Function Tests: Recent Labs  Lab 09/16/20 0431 09/19/20 1848 09/20/20 0344  AST 37 28 24  ALT 36 25 24  ALKPHOS 80 76 69  BILITOT 1.3* 0.5 1.2  PROT 7.3 7.3 6.4*  ALBUMIN 4.1 4.0 3.5   No results for input(s): LIPASE, AMYLASE in the last 168 hours. Recent Labs  Lab 09/19/20 0032  AMMONIA 21   Coagulation profile Recent Labs  Lab 09/16/20 0431 09/19/20 1848  INR 1.0 1.0    CBC: Recent Labs  Lab 09/16/20 0431 09/17/20 0427 09/18/20 0140 09/19/20 1848 09/20/20 0344  WBC 8.9 8.0 10.1 8.5 7.9  NEUTROABS 6.3  --   --  5.6  --   HGB 16.0* 13.8 14.2 14.6 14.3  HCT 48.5* 39.4 41.9 44.3 40.8  MCV 91.9 90.2 89.9 92.1 90.5  PLT 329 273 265 278 256   Cardiac Enzymes: No results for input(s): CKTOTAL, CKMB, CKMBINDEX, TROPONINI in the last 168 hours. BNP: Invalid input(s): POCBNP CBG: Recent Labs  Lab 09/19/20 1837 09/20/20 1648 09/20/20 2141 09/21/20 0621 09/21/20 1108  GLUCAP 122* 122* 139* 106* 113*   D-Dimer No results for input(s): DDIMER in the last 72 hours. Hgb A1c No results for input(s): HGBA1C in the last 72 hours. Lipid Profile No results for input(s): CHOL, HDL, LDLCALC, TRIG, CHOLHDL, LDLDIRECT in the last 72 hours. Thyroid function studies No results for input(s): TSH, T4TOTAL, T3FREE, THYROIDAB in the last 72 hours.  Invalid input(s): FREET3 Anemia work up Recent Labs     09/20/20 [place]   Microbiology Recent Results (from the past 240 hour(s))  Resp Panel by RT-PCR (Flu A&B, Covid) Nasopharyngeal Swab     Status: None   Collection Time: 09/16/20  4:40 AM   Specimen: Nasopharyngeal Swab; Nasopharyngeal(NP) swabs in vial transport medium  Result Value Ref Range Status   SARS Coronavirus 2 by RT PCR NEGATIVE NEGATIVE Final    Comment: (NOTE) SARS-CoV-2 target nucleic acids are NOT DETECTED.  The SARS-CoV-2 RNA is generally detectable in upper respiratory specimens during the acute phase of infection. The lowest concentration of SARS-CoV-2 viral copies this assay can detect is 138 copies/mL. A negative result does not preclude SARS-Cov-2 infection and should not be used as the sole basis for treatment or other patient management decisions. A negative result may occur with  improper specimen collection/handling, submission of specimen other than nasopharyngeal swab, presence of viral mutation(s) within the areas targeted by this assay, and inadequate number of viral copies(<138 copies/mL). A negative result must be combined with clinical observations, patient history, and epidemiological information. The expected result is Negative.  Fact Sheet for Patients:  EntrepreneurPulse.com.au  Fact  Sheet for Healthcare Providers:  IncredibleEmployment.be  This test is no t yet approved or cleared by the [place] FDA and  has been authorized for detection and/or diagnosis of SARS-CoV-2 by FDA under an Emergency Use Authorization (EUA). This EUA will remain  in effect (meaning this test can be used) for the duration of the COVID-19 declaration under Section 564(b)(1) of the Act, 21 U.S.C.section 360bbb-3(b)(1), unless the authorization is terminated  or revoked sooner.       Influenza A by PCR NEGATIVE NEGATIVE Final   Influenza B by PCR NEGATIVE NEGATIVE Final    Comment: (NOTE) The Xpert Xpress  SARS-CoV-2/FLU/RSV plus assay is intended as an aid in the diagnosis of influenza from Nasopharyngeal swab specimens and should not be used as a sole basis for treatment. Nasal washings and aspirates are unacceptable for Xpert Xpress SARS-CoV-2/FLU/RSV testing.  Fact Sheet for Patients: EntrepreneurPulse.com.au  Fact Sheet for Healthcare Providers: IncredibleEmployment.be  This test is not yet approved or cleared by the [place] FDA and has been authorized for detection and/or diagnosis of SARS-CoV-2 by FDA under an Emergency Use Authorization (EUA). This EUA will remain in effect (meaning this test can be used) for the duration of the COVID-19 declaration under Section 564(b)(1) of the Act, 21 U.S.C. section 360bbb-3(b)(1), unless the authorization is terminated or revoked.  Performed at [facility], [place] [address] [place], [place] [address]   SARS CORONAVIRUS 2 (TAT 6-24 HRS) Nasopharyngeal Nasopharyngeal Swab     Status: None   Collection Time: 09/19/20 10:03 PM   Specimen: Nasopharyngeal Swab  Result Value Ref Range Status   SARS Coronavirus 2 NEGATIVE NEGATIVE Final    Comment: (NOTE) SARS-CoV-2 target nucleic acids are NOT DETECTED.  The SARS-CoV-2 RNA is generally detectable in upper and lower respiratory specimens during the acute phase of infection. Negative results do not preclude SARS-CoV-2 infection, do not rule out co-infections with other pathogens, and should not be used as the sole basis for treatment or other patient management decisions. Negative results must be combined with clinical observations, patient history, and epidemiological information. The expected result is Negative.  Fact Sheet for Patients: SugarRoll.be  Fact Sheet for Healthcare Providers: https://www.woods-mathews.com/  This test is not yet approved or cleared by the [place] FDA and  has been  authorized for detection and/or diagnosis of SARS-CoV-2 by FDA under an Emergency Use Authorization (EUA). This EUA will remain  in effect (meaning this test can be used) for the duration of the COVID-19 declaration under Se ction 564(b)(1) of the Act, 21 U.S.C. section 360bbb-3(b)(1), unless the authorization is terminated or revoked sooner.  Performed at [facility], [place] [address], [place], [place] [address]      Discharge Instructions:   Discharge Instructions     Ambulatory referral to Neurology   Complete by: As directed    An appointment is requested in approximately: 4 weeks for stroke  followup   Call MD for:   Complete by: As directed    Worsening symptoms including new numbness weakness.   Diet - low sodium heart healthy   Complete by: As directed    Discharge instructions   Complete by: As directed    Your dose of cholesterol medication has increased.  Please follow-up with your primary care physician in 1 week.  Follow-up with [facility] as scheduled by the clinic(internal referral has been made).No driving until spells completely abate and patient is cleared by outpatient neurologist  Increase activity slowly   Complete by: As directed       Allergies as of 09/21/2020       Reactions   Influenza Virus Vaccine Other (See Comments)   Lisinopril Cough        Medication List     TAKE these medications    amLODipine 10 MG tablet Commonly known as: NORVASC Take 10 mg by mouth daily.   aspirin EC 81 MG tablet Take 81 mg by mouth daily.   buPROPion 300 MG 24 hr tablet Commonly known as: WELLBUTRIN XL Take 300 mg by mouth daily.   cholecalciferol 1000 units tablet Commonly known as: VITAMIN D Take 2,000 Units by mouth daily.   glipiZIDE 2.5 MG 24 hr tablet Commonly known as: GLUCOTROL XL Take 2.5 mg by mouth daily with breakfast.   levothyroxine 50 MCG tablet Commonly known as: SYNTHROID Take 50 mcg by mouth daily  before breakfast.   losartan 50 MG tablet Commonly known as: COZAAR Take 50 mg by mouth daily.   Melatonin 10 MG Tabs Take 10 mg by mouth at bedtime as needed.   metoprolol tartrate 100 MG tablet Commonly known as: LOPRESSOR Take 100 mg by mouth 2 (two) times daily.   montelukast 10 MG tablet Commonly known as: SINGULAIR Take 10 mg by mouth daily as needed (allergies).   nitroGLYCERIN 0.4 MG SL tablet Commonly known as: Nitrostat Place 1 tablet (0.4 mg total) under the tongue every 5 (five) minutes as needed for chest pain.   pravastatin 80 MG tablet Commonly known as: PRAVACHOL Take 1 tablet (80 mg total) by mouth at bedtime. What changed:   medication strength  how much to take   ticagrelor 90 MG Tabs tablet Commonly known as: BRILINTA Take 1 tablet (90 mg total) by mouth 2 (two) times daily.        Follow-up Information     Maurice Small, MD. Schedule an appointment as soon as possible for a visit in 1 week(s).   Specialty: Family Medicine Why: regular checkup Contact information: [place] [address] [phone]                  Time coordinating discharge: 39 minutes  Signed:  Airika Alkhatib  [facility] 09/21/2020, 1:54 PM

## 2020-09-21 NOTE — Evaluation (Addendum)
Physical Therapy Evaluation and Discharge Patient Details Name: Jamie Benjamin MRN: [id number] DOB: 1945/10/23 Today's Date: 09/21/2020   History of Present Illness  Pt is a 75 y.o. female with a PMH significant for HTN, hypothyroidism, CKD stage III a, recent NSTEMI with stent placed in the LAD and discharged from the hospital 07/19/21 who presented back to the ER the same night with change in behavior. 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    Clinical Impression  Patient evaluated by Physical Therapy with no further acute PT needs identified. All education has been completed and the patient has no further questions. At the time of PT eval pt was able to perform transfers and ambulation with gross modified independence to independence and no AD. Pt reports she is at/near baseline of function since her NSTEMI. Based on today's performance, do not feel follow-up PT is warranted to address deficits from the stroke, however do feel she continues to need cardiac rehab follow-up and is safe to transition to that rehab as originally planned from a mobility standpoint. See below for any follow-up Physical Therapy or equipment needs. PT is signing off. Thank you for this referral.      Follow Up Recommendations Supervision - Intermittent (Cardiac rehab as recommended s/p NSTEMI)    Equipment Recommendations  None recommended by PT    Recommendations for Other Services       Precautions / Restrictions Precautions Precautions: Fall Restrictions Weight Bearing Restrictions: No      Mobility  Bed Mobility Overal bed mobility: Modified Independent             General bed mobility comments: No assist required. HOB flat with min use of rails.    Transfers Overall transfer level: Independent Equipment used: None             General transfer comment: No assist required and no unsteadiness noted.  Ambulation/Gait Ambulation/Gait  assistance: Modified independent (Device/Increase time) Gait Distance (Feet): 500 Feet Assistive device: None Gait Pattern/deviations: WFL(Within Functional Limits) Gait velocity: Decreased Gait velocity interpretation: 1.31 - 2.62 ft/sec, indicative of limited community ambulator General Gait Details: Decreased gait speed but overall pt ambulating at/near baseline of function per pt report. No unsteadiness or overt LOB noted.  Stairs Stairs: Yes Stairs assistance: Modified independent (Device/Increase time) Stair Management: One rail Left;Alternating pattern;Step to pattern;Forwards Number of Stairs: 7 General stair comments: Cautious but generally steady. No balance deficits noted. Pt dyspneic after stair training and required a short standing rest break to recover.  Wheelchair Mobility    Modified Rankin (Stroke Patients Only)       Balance Overall balance assessment: No apparent balance deficits (not formally assessed)                                           Pertinent Vitals/Pain Pain Assessment: No/denies pain    Home Living Family/patient expects to be discharged to:: Private residence Living Arrangements: Alone Available Help at Discharge: Family;Available 24 hours/day Type of Home: House Home Access: Level entry     Home Layout: Two level Home Equipment: None Additional Comments: Son staying with her first night, then she will go to her brother in law and his wife's house who are available 24/7    Prior Function Level of Independence: Independent  Comments: Prior to admission for NSTEMI pt was very active, driving, 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     Hand Dominance   Dominant Hand: Right    Extremity/Trunk Assessment   Upper Extremity Assessment Upper Extremity Assessment: Defer to OT evaluation    Lower Extremity Assessment Lower Extremity Assessment: LLE deficits/detail LLE Deficits /  Details: Very mild strength deficits noted in the L quads, hamstrings, ankle dorsiflexors and hip flexors. Grossly 4+/5 compared to R side which was 5/5. LLE Sensation: WNL LLE Coordination: WNL    Cervical / Trunk Assessment Cervical / Trunk Assessment: Normal  Communication   Communication: No difficulties  Cognition Arousal/Alertness: Awake/alert Behavior During Therapy: WFL for tasks assessed/performed Overall Cognitive Status: Within Functional Limits for tasks assessed Area of Impairment: Memory;Problem solving                     Memory: Decreased short-term memory       Problem Solving: Slow processing General Comments: Overall WFL, mild cognitive deficits not pt's baseline. Pt with difficulty in sequential problem solving with Short Blessed Test (SBT) and slower responses with test. Pt scored 1/28 on SBT indicating normal cognition. Pt initially reported the month as march but with cues, corrected to february. Pt reports word finding has improved.      General Comments General comments (skin integrity, edema, etc.): VSS on RA. Encouraged pt to follow up with regular eye exam to ensure vision WFL. If cognition deficits persist, encouraged pt to mention to OP MD follow-up for OP SLP or OP OT as needed    Exercises     Assessment/Plan    PT Assessment Patent does not need any further PT services  PT Problem List         PT Treatment Interventions      PT Goals (Current goals can be found in the Care Plan section)  Acute Rehab PT Goals Patient Stated Goal: return to prior level of functioning PT Goal Formulation: All assessment and education complete, DC therapy    Frequency     Barriers to discharge        Co-evaluation               AM-PAC PT "6 Clicks" Mobility  Outcome Measure Help needed turning from your back to your side while in a flat bed without using bedrails?: None Help needed moving from lying on your back to sitting on the side of  a flat bed without using bedrails?: None Help needed moving to and from a bed to a chair (including a wheelchair)?: None Help needed standing up from a chair using your arms (e.g., wheelchair or bedside chair)?: None Help needed to walk in hospital room?: None Help needed climbing 3-5 steps with a railing? : A Little 6 Click Score: 23    End of Session Equipment Utilized During Treatment: Gait belt Activity Tolerance: Patient tolerated treatment well Patient left: in chair;with call bell/phone within reach Nurse Communication: Mobility status PT Visit Diagnosis: Other symptoms and signs involving the nervous system (R29.898)    Time: 8466-5993 PT Time Calculation (min) (ACUTE ONLY): 32 min   Charges:   PT Evaluation $PT Eval Moderate Complexity: 1 Mod PT Treatments $Gait Training: 8-22 mins        Rolinda Roan, PT, DPT Acute Rehabilitation Services Pager: [phone] Office: [phone]   Thelma Comp 09/21/2020, 12:41 PM

## 2020-09-21 NOTE — Evaluation (Signed)
Occupational Therapy Evaluation/Discharge Patient Details Name: Jamie Benjamin MRN: [id number] DOB: 09/03/1945 Today's Date: 09/21/2020    History of Present Illness Jamie Benjamin is a 75 y.o. female with a history of hypertension, hyperlipidemia, hypothyroidism, CKD stage III a, recent NSTEMI with stent placed in the LAD and discharged from the hospital today who presented back to the ER tonight, 09/19/2020, with change in behavior.   Clinical Impression   PTA, Pt lives alone and reports Independence in all ADLs, IADLs and mobility without AD. Pt presents now overall close to baseline with minor deficits in cognition. Pt aware of cognition deficits and appears that they are resolving, no word finding difficulties during this time. Pt overall Independent with ADLs and mobility in hallway without AD. Pt reports some visual difficulties but reports this may be due to need for new prescription. Pt scored 1/28 on Short Blessed Test indicating normal cognition though slower problem solving noted. Pt reports her son will stay with her at home tonight and then she will stay with other family members who can provide 24/7 support as needed. Anticipate pt to progress quickly to her normal activities. Encouraged pt if deficits do not fully resolve after outpatient MD follow-up, can consider OP therapy services as needed. OT to sign off at acute level. Thank you for this referral.     Follow Up Recommendations  No OT follow up    Equipment Recommendations  None recommended by OT    Recommendations for Other Services       Precautions / Restrictions Precautions Precautions: Fall Restrictions Weight Bearing Restrictions: No      Mobility Bed Mobility               General bed mobility comments: up in recliner    Transfers Overall transfer level: Independent Equipment used: None                  Balance Overall balance assessment: No apparent balance deficits (not formally  assessed)                                         ADL either performed or assessed with clinical judgement   ADL Overall ADL's : Independent                                       General ADL Comments: Able to complete mobility in hallway, multi task, no LOB. Reports she has been mobilizing to/from bathroom and brushed teeth without assist. Pt endorsed feeling winded with stair training with PT. Encouraged pt to obtain shower chair if still winded with daily tasks     Vision Baseline Vision/History: Wears glasses Wears Glasses: At all times Patient Visual Report: Other (comment) (pt questions if new impairment, glasses dirty, etc) Vision Assessment?: Vision impaired- to be further tested in functional context Additional Comments: Pt reports she may need new prescription or perhaps glassess dirty. Did not specify how vision looked (asked if blurry, etc). Able to scan appropriately     Perception     Praxis      Pertinent Vitals/Pain Pain Assessment: No/denies pain     Hand Dominance Right   Extremity/Trunk Assessment Upper Extremity Assessment Upper Extremity Assessment: Overall WFL for tasks assessed   Lower Extremity Assessment Lower Extremity Assessment: Defer to  PT evaluation   Cervical / Trunk Assessment Cervical / Trunk Assessment: Normal   Communication Communication Communication: No difficulties   Cognition Arousal/Alertness: Awake/alert Behavior During Therapy: WFL for tasks assessed/performed Overall Cognitive Status: Impaired/Different from baseline Area of Impairment: Memory;Problem solving                     Memory: Decreased short-term memory       Problem Solving: Slow processing General Comments: Overall WFL, mild cognitive deficits not pt's baseline. Pt with difficulty in sequential problem solving with Short Blessed Test (SBT) and slower responses with test. Pt scored 1/28 on SBT indicating normal  cognition. Pt initially reported the month as march but with cues, corrected to february. Pt reports word finding has improved.   General Comments  VSS on RA. Encouraged pt to follow up with regular eye exam to ensure vision WFL. If cognition deficits persist, encouraged pt to mention to OP MD follow-up for OP SLP or OP OT as needed    Exercises     Shoulder Instructions      Home Living Family/patient expects to be discharged to:: Private residence Living Arrangements: Alone Available Help at Discharge: Family;Available 24 hours/day Type of Home: House Home Access: Level entry     Home Layout: Two level Alternate Level Stairs-Number of Steps: flight separated by a landing in the middle Alternate Level Stairs-Rails: Left Bathroom Shower/Tub: Tub/shower unit (has tub/shower at home, reports brother in [place] home large and likely has tub/shower and walk in shower)   Bathroom Toilet: Standard     Home Equipment: None   Additional Comments: Son staying with her first night, then she will go to her brother in law and his wife's house who are available 24/7      Prior Functioning/Environment Level of Independence: Independent        Comments: Prior to admission for NSTEMI pt was very active, driving, 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        OT Problem List:        OT Treatment/Interventions:      OT Goals(Current goals can be found in the care plan section) Acute Rehab OT Goals Patient Stated Goal: return to prior level of functioning  OT Frequency:     Barriers to D/C:            Co-evaluation              AM-PAC OT "6 Clicks" Daily Activity     Outcome Measure Help from another person eating meals?: None Help from another person taking care of personal grooming?: None Help from another person toileting, which includes using toliet, bedpan, or urinal?: None Help from another person bathing (including washing, rinsing, drying)?:  None Help from another person to put on and taking off regular upper body clothing?: None Help from another person to put on and taking off regular lower body clothing?: None 6 Click Score: 24   End of Session Nurse Communication: Mobility status;Other (comment) (requests for meds)  Activity Tolerance: Patient tolerated treatment well Patient left: in chair;with call bell/phone within reach  OT Visit Diagnosis: Other symptoms and signs involving cognitive function                Time: 5621-3086 OT Time Calculation (min): 22 min Charges:  OT General Charges $OT Visit: 1 Visit OT Evaluation $OT Eval Low Complexity: [place], OTR/L Acute Rehab Services Office: [phone]  Layla Maw 09/21/2020, 10:48 AM

## 2020-09-22 ENCOUNTER — Telehealth (HOSPITAL_COMMUNITY): Payer: Self-pay

## 2020-09-22 NOTE — Telephone Encounter (Signed)
Pt insurance is active and benefits verified through Medicare A/B. Co-pay $0.00, DED $233.00/$0.00 met, out of pocket $0.00/$0.00 met, co-insurance 20%. No pre-authorization required. Passport, 09/22/20 @ 12:23PM, GEX#[id number]  Pt 2ndary [employer] does not cover CR. 09/22/20 @ 2:31PM, DGU#[id number]  Will contact patient to see if she is interested in the Cardiac Rehab Program. If interested, patient will need to complete follow up appt. Once completed, patient will be contacted for scheduling upon review by the RN Navigator.

## 2020-09-22 NOTE — Telephone Encounter (Signed)
Called patient to see if she is interested in the Cardiac Rehab Program. Patient expressed interest. Explained scheduling process and went over insurance, patient verbalized understanding. Will contact patient for scheduling once f/u has been completed. 

## 2020-09-25 ENCOUNTER — Telehealth (HOSPITAL_COMMUNITY): Payer: Self-pay

## 2020-09-25 NOTE — Telephone Encounter (Signed)
Pharmacy Transitions of Care Follow-up Telephone Call  Date of discharge: 09/18/20  Discharge Diagnosis: NSTEMI  How have you been since you were released from the hospital? Patient has been well since discharge. Patient is currently staying with family so she has someone looking after her for now. She has been walking around the house but gets tired easily. Knows s/sx of bleeding to watch out for  Medication changes made at discharge: yes  Medication changes obtained and verified? yes    Medication Accessibility:  . Home Pharmacy: patient has already refills at her home pharmacy and can afford her refills.    Medication Review:  TICAGRELOR (BRILINTA) Ticagrelor 90 mg BID initiated on 09/18/20.  - Educated patient on expected duration of therapy of aspirin of 1 year with ticagrelor.  Aspirin will be continued indefinitely/discontinued. - Discussed importance of taking medication around the same time every day, - Advised patient of medications to avoid (NSAIDs, aspirin maintenance doses>100 mg daily) - Educated that Tylenol (acetaminophen) will be the preferred analgesic to prevent risk of bleeding  - Emphasized importance of monitoring for signs and symptoms of bleeding (abnormal bruising, prolonged bleeding, nose bleeds, bleeding from gums, discolored urine, black tarry stools)  - Educated patient to notify doctor if shortness of breath or abnormal heartbeat occur - Advised patient to alert all providers of antiplatelet therapy prior to starting a new medication or having a procedure   Follow-up Appointments:  Patient has follow up scheduled with Dr. Rosalyn Gess in Cardiology on 10/05/20. Has another follow up with neurology on 12/03/20  If their condition worsens, is the pt aware to call PCP or go to the Emergency Dept.? yes  Final Patient Assessment: Patient doing well. Has refills at home pharmacy and follow ups scheduled.

## 2020-09-29 DIAGNOSIS — I214 Non-ST elevation (NSTEMI) myocardial infarction: Secondary | ICD-10-CM | POA: Diagnosis not present

## 2020-09-29 DIAGNOSIS — Z09 Encounter for follow-up examination after completed treatment for conditions other than malignant neoplasm: Secondary | ICD-10-CM | POA: Diagnosis not present

## 2020-09-29 DIAGNOSIS — I639 Cerebral infarction, unspecified: Secondary | ICD-10-CM | POA: Diagnosis not present

## 2020-09-30 DIAGNOSIS — Z5181 Encounter for therapeutic drug level monitoring: Secondary | ICD-10-CM | POA: Diagnosis not present

## 2020-10-05 ENCOUNTER — Encounter: Payer: Self-pay | Admitting: Cardiology

## 2020-10-05 ENCOUNTER — Ambulatory Visit (INDEPENDENT_AMBULATORY_CARE_PROVIDER_SITE_OTHER): Payer: Medicare Other | Admitting: Cardiology

## 2020-10-05 ENCOUNTER — Other Ambulatory Visit: Payer: Self-pay

## 2020-10-05 VITALS — BP 132/66 | HR 70 | Ht <= 58 in | Wt 161.8 lb

## 2020-10-05 DIAGNOSIS — Z9861 Coronary angioplasty status: Secondary | ICD-10-CM

## 2020-10-05 DIAGNOSIS — E785 Hyperlipidemia, unspecified: Secondary | ICD-10-CM

## 2020-10-05 DIAGNOSIS — Z79899 Other long term (current) drug therapy: Secondary | ICD-10-CM | POA: Diagnosis not present

## 2020-10-05 DIAGNOSIS — I2510 Atherosclerotic heart disease of native coronary artery without angina pectoris: Secondary | ICD-10-CM

## 2020-10-05 DIAGNOSIS — I214 Non-ST elevation (NSTEMI) myocardial infarction: Secondary | ICD-10-CM

## 2020-10-05 DIAGNOSIS — I6310 Cerebral infarction due to embolism of unspecified precerebral artery: Secondary | ICD-10-CM

## 2020-10-05 DIAGNOSIS — R9389 Abnormal findings on diagnostic imaging of other specified body structures: Secondary | ICD-10-CM | POA: Diagnosis not present

## 2020-10-05 DIAGNOSIS — N1831 Chronic kidney disease, stage 3a: Secondary | ICD-10-CM | POA: Diagnosis not present

## 2020-10-05 DIAGNOSIS — I639 Cerebral infarction, unspecified: Secondary | ICD-10-CM | POA: Insufficient documentation

## 2020-10-05 NOTE — Assessment & Plan Note (Signed)
Pt had an abnormal area on her MRI- ? Malignancy- f/u recomended

## 2020-10-05 NOTE — Assessment & Plan Note (Signed)
GFR 46

## 2020-10-05 NOTE — Assessment & Plan Note (Signed)
Presented 09/16/2020- peak troponin 746

## 2020-10-05 NOTE — Assessment & Plan Note (Signed)
LAD PCI with DES x 2

## 2020-10-05 NOTE — Assessment & Plan Note (Signed)
LDL was 79 on Pravachol 40 mg- dose increased to 80 mg She has had intolerance (myalgias) to other statins

## 2020-10-05 NOTE — Assessment & Plan Note (Signed)
2 small Lt brain strokes on MRI the day of discharge post MI/PCI- 09/19/2020

## 2020-10-05 NOTE — Progress Notes (Signed)
Cardiology Office Note:    Date:  10/05/2020   ID:  Jamie Benjamin, DOB [birth year], MRN [id number]  PCP:  Maurice Small, MD  Cardiologist:  No primary care provider on file.  Electrophysiologist:  None   Referring MD: Maurice Small, MD   No chief complaint on file. post hospital f/u  History of Present Illness:    Jamie Benjamin is a 75 y.o. female with a hx of hypertension, hyperlipidemia with past statin intolerance, non-insulin-dependent diabetes, hypothyroidism, and breast cancer status post bilateral radical mastectomy 16 years ago who was admitted 09/16/2020 with an NSTEMI.  Patient developed substernal chest pain around 1 AM on the day of admission.  It radiated to her jaws and arms.  She presented to [facility], ER.  CTA of the chest showed no dissection but the presence of coronary atherosclerosis.  Her troponins peaked to 95.  Her EKG showed septal Q's.  She was taken to the Cath Lab for catheterization revealed a proximal LAD 99% occlusion.  This was dilated and treated with DES x2.  She had no other significant coronary disease and a good result after her PCI.  Echocardiogram revealed an ejection fraction of 50 to 55%.  There was hypokinesis of the mid to apical anterior septum.  The patient was discharged 09/19/2020 and readmitted later that evening with confusion.  MRI revealed 2 punctate acute infarcts in the left occipital lobe and left posterior parietal lobe.  There was also a 2 cm region of left frontal bone abnormality, questionable metastatic lesion versus benign marrow abnormality.   The patient is seen in the office today for follow-up.  She is accompanied by her son.  From a cardiac standpoint she is done well, she denies any chest pain or shortness of breath.  She is walking daily.  From a stroke standpoint she is almost fully recovered, she still notices dizziness at times.    During her hospitalization her presenting LDL was 79 on Pravachol 40 mg a day.  She has been  unable to tolerate Lipitor in the past secondary to myalgias.  Her Pravachol was increased to 80 mg a day and she was given an appointment to follow-up with our pharmacist presumably to consider PCSK9.  The patient is concerned about the cost of this and wondered if we could not wait and check her LDL on the increase Pravachol, this seems like a reasonable approach to me.  Past Medical History:  Diagnosis Date  . Allergy    seasonal  . Arthritis   . Breast cancer ([place]) 2005   left  . Depression   . Diverticulitis   . Hyperlipidemia   . Hypertension   . Hypothyroidism (acquired)   . IBS (irritable bowel syndrome)   . Obesity   . Prediabetes   . Stage 3a chronic kidney disease ([place]) 09/16/2020    Past Surgical History:  Procedure Laterality Date  . COLONOSCOPY    . CORONARY STENT INTERVENTION N/A 09/17/2020   Procedure: CORONARY STENT INTERVENTION;  Surgeon: Martinique, Peter M, MD;  Location: [place] [facility];  Service: Cardiovascular;  Laterality: N/A;  . INTRAVASCULAR IMAGING/OCT N/A 09/17/2020   Procedure: INTRAVASCULAR IMAGING/OCT;  Surgeon: Martinique, Peter M, MD;  Location: [facility] [facility];  Service: Cardiovascular;  Laterality: N/A;  . LEFT HEART CATH AND CORONARY ANGIOGRAPHY N/A 09/17/2020   Procedure: LEFT HEART CATH AND CORONARY ANGIOGRAPHY;  Surgeon: Martinique, Peter M, MD;  Location: [place] [facility];  Service: Cardiovascular;  Laterality:  N/A;  . MASTECTOMY, RADICAL Bilateral 10 years ago  . ROTATOR CUFF REPAIR      Current Medications: Current Meds  Medication Sig  . amLODipine (NORVASC) 10 MG tablet Take 10 mg by mouth daily.  Marland Kitchen aspirin EC 81 MG tablet Take 81 mg by mouth daily.  Marland Kitchen buPROPion (WELLBUTRIN XL) 300 MG 24 hr tablet Take 300 mg by mouth daily.  . cholecalciferol (VITAMIN D) 1000 UNITS tablet Take 2,000 Units by mouth daily.  Marland Kitchen glipiZIDE (GLUCOTROL XL) 2.5 MG 24 hr tablet Take 2.5 mg by mouth daily with breakfast.  . levothyroxine (SYNTHROID, LEVOTHROID) 50  MCG tablet Take 50 mcg by mouth daily before breakfast.  . losartan (COZAAR) 50 MG tablet Take 50 mg by mouth daily.  . metoprolol tartrate (LOPRESSOR) 100 MG tablet Take 100 mg by mouth 2 (two) times daily.  . nitroGLYCERIN (NITROSTAT) 0.4 MG SL tablet Place 1 tablet (0.4 mg total) under the tongue every 5 (five) minutes as needed for chest pain.  . pravastatin (PRAVACHOL) 80 MG tablet Take 1 tablet (80 mg total) by mouth at bedtime.  . ticagrelor (BRILINTA) 90 MG TABS tablet Take 1 tablet (90 mg total) by mouth 2 (two) times daily.  . [DISCONTINUED] Melatonin 10 MG TABS Take 10 mg by mouth at bedtime as needed.  . [DISCONTINUED] montelukast (SINGULAIR) 10 MG tablet Take 10 mg by mouth daily as needed (allergies).     Allergies:   Influenza virus vaccine and Lisinopril   Social History   Socioeconomic History  . Marital status: Widowed    Spouse name: Not on file  . Number of children: Not on file  . Years of education: Not on file  . Highest education level: Not on file  Occupational History  . Occupation: Retired Therapist, sports  Tobacco Use  . Smoking status: Never Smoker  . Smokeless tobacco: Never Used  Vaping Use  . Vaping Use: Never used  Substance and Sexual Activity  . Alcohol use: No  . Drug use: No  . Sexual activity: Not on file  Other Topics Concern  . Not on file  Social History Narrative  . Not on file   Social Determinants of Health   Financial Resource Strain: Not on file  Food Insecurity: Not on file  Transportation Needs: Not on file  Physical Activity: Not on file  Stress: Not on file  Social Connections: Not on file     Family History: The patient's family history includes Breast cancer in her sister; Ulcerative colitis in her sister. There is no history of Colon cancer, Colon polyps, Esophageal cancer, Rectal cancer, or Stomach cancer.  ROS:   Please see the history of present illness.     All other systems reviewed and are negative.  EKGs/Labs/Other  Studies Reviewed:    The following studies were reviewed today: Echo 09/16/2020- IMPRESSIONS    1. Technically difficult study. Left ventricular ejection fraction, by  estimation, is 50 to 55%. The left ventricle has grossly normal function.  Left ventricular endocardial border not optimally defined to evaluate  regional wall motion. Appears to be  hypokinesis of mid to apical anteroseptum. Left ventricular diastolic  parameters are consistent with Grade I diastolic dysfunction (impaired  relaxation).  2. Right ventricular systolic function is mildly reduced. The right  ventricular size is normal. Tricuspid regurgitation signal is inadequate  for assessing PA pressure.  3. The mitral valve is normal in structure. No evidence of mitral valve  regurgitation.  4. The  aortic valve is tricuspid. Aortic valve regurgitation is not  visualized. No aortic stenosis is present.  5. The inferior vena cava is normal in size with greater than 50%  respiratory variability, suggesting right atrial pressure of 3 mmHg.   EKG:  EKG is ordered today.  The ekg ordered today demonstrates NSR, HR 70, septal Qs  Recent Labs: 09/16/2020: TSH 4.189 09/20/2020: ALT 24; BUN 19; Creatinine, Ser 1.23; Hemoglobin 14.3; Platelets 256; Potassium 3.7; Sodium 140  Recent Lipid Panel    Component Value Date/Time   CHOL 157 09/16/2020 0431   TRIG 167 (H) 09/16/2020 0431   HDL 45 09/16/2020 0431   CHOLHDL 3.5 09/16/2020 0431   VLDL 33 09/16/2020 0431   LDLCALC 79 09/16/2020 0431    Physical Exam:    VS:  BP 132/66   Pulse 70   Ht 4\' 10"  (1.473 m)   Wt 161 lb 12.8 oz (73.4 kg)   BMI 33.82 kg/m     Wt Readings from Last 3 Encounters:  10/05/20 161 lb 12.8 oz (73.4 kg)  09/19/20 163 lb 3.2 oz (74 kg)  10/29/19 169 lb 6 oz (76.8 kg)     GEN: Overweight Caucasian female,  well developed in no acute distress HEENT: Normal NECK: No JVD; No carotid bruits CARDIAC: RRR, no murmurs, rubs,  gallops RESPIRATORY:  Clear to auscultation without rales, wheezing or rhonchi  ABDOMEN: obese, soft,  non-distended MUSCULOSKELETAL:  No edema; No deformity  SKIN: Warm and dry NEUROLOGIC:  Alert and oriented x 3 PSYCHIATRIC:  Normal affect   ASSESSMENT:    NSTEMI (non-ST elevated myocardial infarction) ([place]) Presented 09/16/2020- peak troponin 746  CAD S/P percutaneous coronary angioplasty LAD PCI with DES x 2  CKD (chronic kidney disease), stage III (HCC) GFR 46  Embolic stroke (HCC) 2 small Lt brain strokes on MRI the day of discharge post MI/PCI- 09/19/2020  Dyslipidemia, goal LDL below 70 LDL was 79 on Pravachol 40 mg- dose increased to 80 mg She has had intolerance (myalgias) to other statins  Abnormal MRI Pt had an abnormal area on her MRI- ? Malignancy- f/u recomended  PLAN:    Check lipids in 3 months on increased Pravachol.  OK for cardiac rehab.  I'll send a message to her PCP about the abnormal MRI- (not clear what f/u is needed). F/U Dr Gwenlyn Found in 3 months.    Medication Adjustments/Labs and Tests Ordered: Current medicines are reviewed at length with the patient today.  Concerns regarding medicines are outlined above.  Orders Placed This Encounter  Procedures  . Comprehensive Metabolic Panel (CMET)  . Lipid panel  . EKG 12-Lead   No orders of the defined types were placed in this encounter.   Patient Instructions  Medication Instructions:  Continue current medications  *If you need a refill on your cardiac medications before your next appointment, please call your pharmacy*   Lab Work: CMP and Fasting Lipids in 3 months  If you have labs (blood work) drawn today and your tests are completely normal, you will receive your results only by: Marland Kitchen MyChart Message (if you have MyChart) OR . A paper copy in the mail If you have any lab test that is abnormal or we need to change your treatment, we will call you to review the  results.   Testing/Procedures: None Ordered   Follow-Up: At [facility] you and your health needs are our priority.  As part of our continuing mission to provide you with exceptional heart  care, we have created designated Provider Care Teams.  These Care Teams include your primary Cardiologist (physician) and Advanced Practice Providers (APPs -  Physician Assistants and Nurse Practitioners) who all work together to provide you with the care you need, when you need it.  We recommend signing up for the patient portal called "MyChart".  Sign up information is provided on this After Visit Summary.  MyChart is used to connect with patients for Virtual Visits (Telemedicine).  Patients are able to view lab/test results, encounter notes, upcoming appointments, etc.  Non-urgent messages can be sent to your provider as well.   To learn more about what you can do with MyChart, go to [email or web address].    Your next appointment:   3 month(s)  The format for your next appointment:   In Person  Provider:   You may see Quay Burow, MD or one of the following Advanced Practice Providers on your designated Care Team:    Sande Rives, PA-C  Coletta Memos, FNP        Signed, Kerin Ransom, [place]  10/05/2020 10:13 AM    [place]

## 2020-10-05 NOTE — Patient Instructions (Addendum)
Medication Instructions:  Continue current medications  *If you need a refill on your cardiac medications before your next appointment, please call your pharmacy*   Lab Work: CMP and Fasting Lipids in 3 months  If you have labs (blood work) drawn today and your tests are completely normal, you will receive your results only by: Marland Kitchen MyChart Message (if you have MyChart) OR . A paper copy in the mail If you have any lab test that is abnormal or we need to change your treatment, we will call you to review the results.   Testing/Procedures: None Ordered   Follow-Up: At [facility], you and your health needs are our priority.  As part of our continuing mission to provide you with exceptional heart care, we have created designated Provider Care Teams.  These Care Teams include your primary Cardiologist (physician) and Advanced Practice Providers (APPs -  Physician Assistants and Nurse Practitioners) who all work together to provide you with the care you need, when you need it.  We recommend signing up for the patient portal called "MyChart".  Sign up information is provided on this After Visit Summary.  MyChart is used to connect with patients for Virtual Visits (Telemedicine).  Patients are able to view lab/test results, encounter notes, upcoming appointments, etc.  Non-urgent messages can be sent to your provider as well.   To learn more about what you can do with MyChart, go to [email or web address].    Your next appointment:   3 month(s)  The format for your next appointment:   In Person  Provider:   You may see Quay Burow, MD or one of the following Advanced Practice Providers on your designated Care Team:    [place], PA-C  Coletta Memos, FNP

## 2020-10-14 ENCOUNTER — Ambulatory Visit: Payer: Medicare Other

## 2020-10-16 ENCOUNTER — Emergency Department (HOSPITAL_COMMUNITY): Payer: Medicare Other

## 2020-10-16 ENCOUNTER — Emergency Department (HOSPITAL_COMMUNITY)
Admission: EM | Admit: 2020-10-16 | Discharge: 2020-10-16 | Disposition: A | Discharge status: Home / Self Care | Payer: Medicare Other | Attending: Emergency Medicine | Admitting: Emergency Medicine

## 2020-10-16 ENCOUNTER — Other Ambulatory Visit: Payer: Self-pay

## 2020-10-16 DIAGNOSIS — N1831 Chronic kidney disease, stage 3a: Secondary | ICD-10-CM | POA: Insufficient documentation

## 2020-10-16 DIAGNOSIS — M852 Hyperostosis of skull: Secondary | ICD-10-CM | POA: Insufficient documentation

## 2020-10-16 DIAGNOSIS — I129 Hypertensive chronic kidney disease with stage 1 through stage 4 chronic kidney disease, or unspecified chronic kidney disease: Secondary | ICD-10-CM | POA: Insufficient documentation

## 2020-10-16 DIAGNOSIS — Z853 Personal history of malignant neoplasm of breast: Secondary | ICD-10-CM | POA: Diagnosis not present

## 2020-10-16 DIAGNOSIS — Z79899 Other long term (current) drug therapy: Secondary | ICD-10-CM | POA: Insufficient documentation

## 2020-10-16 DIAGNOSIS — R42 Dizziness and giddiness: Secondary | ICD-10-CM

## 2020-10-16 DIAGNOSIS — R7303 Prediabetes: Secondary | ICD-10-CM | POA: Diagnosis not present

## 2020-10-16 DIAGNOSIS — R6889 Other general symptoms and signs: Secondary | ICD-10-CM | POA: Diagnosis not present

## 2020-10-16 DIAGNOSIS — Z7984 Long term (current) use of oral hypoglycemic drugs: Secondary | ICD-10-CM | POA: Insufficient documentation

## 2020-10-16 DIAGNOSIS — Z743 Need for continuous supervision: Secondary | ICD-10-CM | POA: Diagnosis not present

## 2020-10-16 DIAGNOSIS — Z9861 Coronary angioplasty status: Secondary | ICD-10-CM | POA: Insufficient documentation

## 2020-10-16 DIAGNOSIS — I2510 Atherosclerotic heart disease of native coronary artery without angina pectoris: Secondary | ICD-10-CM | POA: Insufficient documentation

## 2020-10-16 DIAGNOSIS — G9389 Other specified disorders of brain: Secondary | ICD-10-CM | POA: Diagnosis not present

## 2020-10-16 DIAGNOSIS — E039 Hypothyroidism, unspecified: Secondary | ICD-10-CM | POA: Insufficient documentation

## 2020-10-16 DIAGNOSIS — R404 Transient alteration of awareness: Secondary | ICD-10-CM | POA: Diagnosis not present

## 2020-10-16 DIAGNOSIS — R531 Weakness: Secondary | ICD-10-CM | POA: Diagnosis not present

## 2020-10-16 DIAGNOSIS — Z7982 Long term (current) use of aspirin: Secondary | ICD-10-CM | POA: Insufficient documentation

## 2020-10-16 LAB — CBC
HCT: 46.1 % — ABNORMAL HIGH (ref 36.0–46.0)
Hemoglobin: 15.6 g/dL — ABNORMAL HIGH (ref 12.0–15.0)
MCH: 31.1 pg (ref 26.0–34.0)
MCHC: 33.8 g/dL (ref 30.0–36.0)
MCV: 91.8 fL (ref 80.0–100.0)
Platelets: 298 10*3/uL (ref 150–400)
RBC: 5.02 MIL/uL (ref 3.87–5.11)
RDW: 13.5 % (ref 11.5–15.5)
WBC: 8.4 10*3/uL (ref 4.0–10.5)
nRBC: 0 % (ref 0.0–0.2)

## 2020-10-16 LAB — BASIC METABOLIC PANEL
Anion gap: 11 (ref 5–15)
BUN: 28 mg/dL — ABNORMAL HIGH (ref 8–23)
CO2: 23 mmol/L (ref 22–32)
Calcium: 9.7 mg/dL (ref 8.9–10.3)
Chloride: 105 mmol/L (ref 98–111)
Creatinine, Ser: 1.09 mg/dL — ABNORMAL HIGH (ref 0.44–1.00)
GFR, Estimated: 53 mL/min — ABNORMAL LOW (ref >60)
Glucose, Bld: 125 mg/dL — ABNORMAL HIGH (ref 70–99)
Potassium: 4 mmol/L (ref 3.5–5.1)
Sodium: 139 mmol/L (ref 135–145)

## 2020-10-16 MED ORDER — MECLIZINE HCL 25 MG PO TABS
25.0000 mg | ORAL_TABLET | Freq: Once | ORAL | Status: AC
  Administered 2020-10-16: 25 mg via ORAL
  Filled 2020-10-16: qty 1

## 2020-10-16 MED ORDER — DIAZEPAM 5 MG PO TABS: 2.5000 mg | ORAL_TABLET | Freq: Four times a day (QID) | ORAL | 0 refills | Status: DC | PRN

## 2020-10-16 MED ORDER — LACTATED RINGERS IV BOLUS
500.0000 mL | Freq: Once | INTRAVENOUS | Status: AC
  Administered 2020-10-16: 500 mL via INTRAVENOUS

## 2020-10-16 MED ORDER — DIAZEPAM 2 MG PO TABS
2.0000 mg | ORAL_TABLET | Freq: Once | ORAL | Status: AC
  Administered 2020-10-16: 2 mg via ORAL
  Filled 2020-10-16: qty 1

## 2020-10-16 MED ORDER — TICAGRELOR 90 MG PO TABS: 90.0000 mg | ORAL_TABLET | Freq: Once | ORAL | Status: DC

## 2020-10-16 MED ORDER — GADOBUTROL 1 MMOL/ML IV SOLN
7.5000 mL | Freq: Once | INTRAVENOUS | Status: AC | PRN
  Administered 2020-10-16: 7.5 mL via INTRAVENOUS

## 2020-10-16 NOTE — ED Triage Notes (Signed)
Per pt, when dizziness started it felt "tilted". Equal grip strength, no facial droop, no slurring of speech.   Pt denies any shortness of breath and chest pain.

## 2020-10-16 NOTE — ED Notes (Addendum)
Pt reports getting out of bed to use the restroom around midnight tonight and felt a rushing/dropping sensation in her head and became dizzy and off balance. Pt was able to make it back to her bed and call 911. States that it same feeling continues to occur on and off. Significant recent history of stroke and heart attack. VSS at this time.

## 2020-10-16 NOTE — ED Triage Notes (Signed)
Dizziness since last night and gets worse upon movement. Not in distress. Bp 138/66. Stroke screen normal.

## 2020-10-17 NOTE — ED Provider Notes (Signed)
[facility] EMERGENCY DEPARTMENT Provider Note   CSN: [id number] Arrival date & time: 10/16/20  0159     History Chief Complaint  Patient presents with  . Dizziness    Jamie Benjamin is a 75 y.o. female.  75 year old female with multiple ankle problems document below who presents the emerge from today with vertigo.  Patient states that she was in her normal state of health until approximately 2330 she had acute onset of feeling like she was falling to 1 side.  Patient states she never had anything like this before.  Of note she did have an NSTEMI recently status post stents placed but then also was complicated by stroke afterwards.  This was thought to be related to the procedure.  Is been compliant with Brilinta since then.  No other neurologic changes with this.   Dizziness      Past Medical History:  Diagnosis Date  . Allergy    seasonal  . Arthritis   . Breast cancer ([place]) 2005   left  . Depression   . Diverticulitis   . Hyperlipidemia   . Hypertension   . Hypothyroidism (acquired)   . IBS (irritable bowel syndrome)   . Obesity   . Prediabetes   . Stage 3a chronic kidney disease ([place]) 09/16/2020    Patient Active Problem List   Diagnosis Date Noted  . NSTEMI (non-ST elevated myocardial infarction) ([place]) 10/05/2020  . Embolic stroke ([place]) 76/73/4193  . Abnormal MRI 10/05/2020  . Confusion 09/20/2020  . Metabolic encephalopathy 79/09/4095  . CAD S/P percutaneous coronary angioplasty 09/19/2020  . ACS (acute coronary syndrome) ([place]) 09/16/2020  . CKD (chronic kidney disease), stage III ([place]) 09/16/2020  . Breast cancer ([place]) 07/02/2019  . Diverticulitis 07/02/2019  . Dyslipidemia, goal LDL below 70 07/02/2019  . Hypertension 07/02/2019  . IBS (irritable bowel syndrome) 07/02/2019  . Hypothyroidism 07/02/2019  . Major depressive disorder with current active episode 07/02/2019  . Class 1 obesity due to excess calories with body mass index  (BMI) of 33.0 to 33.9 in adult 07/02/2019  . Osteoporosis 07/02/2019  . Pernicious anemia 07/02/2019  . Prediabetes 07/02/2019  . Recurrent major depressive disorder, in partial remission ([place]) 07/02/2019  . Sciatica of left side 07/02/2019    Past Surgical History:  Procedure Laterality Date  . COLONOSCOPY    . CORONARY STENT INTERVENTION N/A 09/17/2020   Procedure: CORONARY STENT INTERVENTION;  Surgeon: Martinique, Peter M, MD;  Location: [facility];  Service: Cardiovascular;  Laterality: N/A;  . INTRAVASCULAR IMAGING/OCT N/A 09/17/2020   Procedure: INTRAVASCULAR IMAGING/OCT;  Surgeon: Martinique, Peter M, MD;  Location: [facility];  Service: Cardiovascular;  Laterality: N/A;  . LEFT HEART CATH AND CORONARY ANGIOGRAPHY N/A 09/17/2020   Procedure: LEFT HEART CATH AND CORONARY ANGIOGRAPHY;  Surgeon: Martinique, Peter M, MD;  Location: [facility];  Service: Cardiovascular;  Laterality: N/A;  . MASTECTOMY, RADICAL Bilateral 10 years ago  . ROTATOR CUFF REPAIR       OB History   No obstetric history on file.     Family History  Problem Relation Age of Onset  . Breast cancer Sister   . Ulcerative colitis Sister   . Colon cancer Neg Hx   . Colon polyps Neg Hx   . Esophageal cancer Neg Hx   . Rectal cancer Neg Hx   . Stomach cancer Neg Hx     Social History   Tobacco Use  . Smoking status: Never  Smoker  . Smokeless tobacco: Never Used  Vaping Use  . Vaping Use: Never used  Substance Use Topics  . Alcohol use: No  . Drug use: No    Home Medications Prior to Admission medications   Medication Sig Start Date End Date Taking? Authorizing Provider  amLODipine (NORVASC) 10 MG tablet Take 10 mg by mouth daily. 05/26/19  Yes [provider]  aspirin EC 81 MG tablet Take 81 mg by mouth daily.   Yes [provider]  buPROPion (WELLBUTRIN XL) 300 MG 24 hr tablet Take 300 mg by mouth daily.   Yes [provider]  Cholecalciferol (VITAMIN D) 50  MCG (2000 UT) tablet Take 2,000 Units by mouth daily.   Yes [provider]  diazepam (VALIUM) 5 MG tablet Take 0.5 tablets (2.5 mg total) by mouth every 6 (six) hours as needed for anxiety (spasms). 10/16/20  Yes Mesner, Corene Cornea, MD  glipiZIDE (GLUCOTROL XL) 2.5 MG 24 hr tablet Take 2.5 mg by mouth daily with breakfast.   Yes [provider]  levothyroxine (SYNTHROID, LEVOTHROID) 50 MCG tablet Take 50 mcg by mouth daily before breakfast.   Yes [provider]  losartan (COZAAR) 50 MG tablet Take 50 mg by mouth daily.   Yes [provider]  Melatonin 10 MG TABS Take 10 mg by mouth at bedtime as needed (sleep).   Yes [provider]  metoprolol tartrate (LOPRESSOR) 100 MG tablet Take 100 mg by mouth 2 (two) times daily.   Yes [provider]  nitroGLYCERIN (NITROSTAT) 0.4 MG SL tablet Place 1 tablet (0.4 mg total) under the tongue every 5 (five) minutes as needed for chest pain. 09/21/20 09/21/21 Yes Pokhrel, Laxman, MD  pravastatin (PRAVACHOL) 80 MG tablet Take 1 tablet (80 mg total) by mouth at bedtime. 09/21/20  Yes Pokhrel, Laxman, MD  ticagrelor (BRILINTA) 90 MG TABS tablet Take 1 tablet (90 mg total) by mouth 2 (two) times daily. 09/19/20  Yes Regalado, Belkys A, MD    Allergies    Influenza virus vaccine and Lisinopril  Review of Systems   Review of Systems  Neurological: Positive for dizziness.  All other systems reviewed and are negative.   Physical Exam Updated Vital Signs BP 131/71   Pulse 69   Temp (!) 97.5 F (36.4 C)   Resp 16   Ht 4\' 10"  (1.473 m)   Wt 73.4 kg   SpO2 98%   BMI 33.82 kg/m   Physical Exam Vitals and nursing note reviewed.  Constitutional:      Appearance: She is well-developed.  HENT:     Head: Normocephalic and atraumatic.     Mouth/Throat:     Mouth: Mucous membranes are moist.  Eyes:     Pupils: Pupils are equal, round, and reactive to light.  Cardiovascular:     Rate and Rhythm: Normal rate  and regular rhythm.  Pulmonary:     Effort: No respiratory distress.     Breath sounds: No stridor.  Abdominal:     General: There is no distension.  Musculoskeletal:        General: Normal range of motion.     Cervical back: Normal range of motion.  Skin:    General: Skin is warm and dry.  Neurological:     General: No focal deficit present.     Mental Status: She is alert.     Comments: Patient with what seems to be worsening vertiginous symptoms with turning her head to the  left.  She has baseline grip strength, dorsiflexion, plantarflexion, facial symmetry.     ED Results / Procedures / Treatments   Labs (all labs ordered are listed, but only abnormal results are displayed) Labs Reviewed  CBC - Abnormal; Notable for the following components:      Result Value   Hemoglobin 15.6 (*)    HCT 46.1 (*)    All other components within normal limits  BASIC METABOLIC PANEL - Abnormal; Notable for the following components:   Glucose, Bld 125 (*)    BUN 28 (*)    Creatinine, Ser 1.09 (*)    GFR, Estimated 53 (*)    All other components within normal limits    EKG EKG Interpretation  Date/Time:  Friday October 16 2020 02:20:49 EDT Ventricular Rate:  70 PR Interval:    QRS Duration: 86 QT Interval:  382 QTC Calculation: 413 R Axis:   21 Text Interpretation: Sinus rhythm Low voltage, precordial leads Minimal ST elevation, inferior leads Confirmed by Merrily Pew [phone]) on 10/16/2020 3:33:26 AM   Radiology MR ANGIO HEAD WO CONTRAST  Result Date: 10/16/2020 CLINICAL DATA:  75 year old female with dizziness. Acute on chronic small vessel ischemia and indeterminate left frontal bone lesion on brain MRI last month EXAM: MRI HEAD WITHOUT CONTRAST MRA HEAD WITHOUT CONTRAST MRA NECK WITHOUT AND WITH CONTRAST TECHNIQUE: Multiplanar, multiecho pulse sequences of the brain and surrounding structures were obtained without intravenous contrast. Angiographic images of the Circle of Willis  were obtained using MRA technique without intravenous contrast. Angiographic images of the neck were obtained using MRA technique without and with intravenous contrast. Carotid stenosis measurements (when applicable) are obtained utilizing NASCET criteria, using the distal internal carotid diameter as the denominator. CONTRAST:  7.10mL GADAVIST GADOBUTROL 1 MMOL/ML IV SOLN COMPARISON:  MRI head, MRA head and neck 09/19/2020. FINDINGS: MRI HEAD FINDINGS Brain: No restricted diffusion or evidence of acute infarction. Resolved punctate DWI abnormality in the left occipital pole since last month. Stable patchy and scattered bilateral cerebral white matter T2 and FLAIR hyperintensity in both hemispheres. No cortical encephalomalacia or chronic cerebral blood products identified. Stable T2 heterogeneity in the basal ganglia, although in part related to perivascular spaces. Minimal T2 heterogeneity in the brainstem is stable. Cerebellum remains within normal limits. No midline shift, mass effect, evidence of mass lesion, ventriculomegaly, extra-axial collection or acute intracranial hemorrhage. Cervicomedullary junction and pituitary are within normal limits. Vascular: Major intracranial vascular flow voids are stable since last month. See MRA findings below. 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 But background bone marrow signal is stable and within normal limits. Stable visible cervical spine. Sinuses/Orbits: Stable.  Left sphenoid sinus mucosal thickening. Other: Mastoids remain clear. Grossly normal visible internal auditory structures. Stable, negative visible scalp and face MRA NECK FINDINGS Noncontrast neck MRA last month. Stable antegrade flow signal in the bilateral cervical carotid and vertebral arteries in the neck. Following contrast today a 3 vessel arch configuration is demonstrated.  Proximal great vessels appear normal. Proximal right CCA appears tortuous but otherwise within normal limits. Normal right carotid bifurcation. Normal cervical right ICA. Highly tortuous left CCA in the neck. Negative left carotid bifurcation. Negative cervical left ICA. Codominant vertebral arteries are tortuous but patent to the skull base without evidence of stenosis; the proximal right subclavian artery and right vertebral artery origin are not well evaluated. MRA HEAD FINDINGS Antegrade flow in  the posterior circulation with patent mildly dolichoectatic distal vertebral and basilar arteries. No stenosis. PICA origins are stable and normal. SCA and PCA origins remain normal. Bilateral PCA branches are within normal limits. Antegrade flow in both ICA siphons. Stable mild siphon ectasia greater on the left. No siphon stenosis. Normal ophthalmic artery origins. Posterior communicating arteries are diminutive or absent. Patent carotid termini. Normal MCA and ACA origins. Diminutive or absent anterior communicating artery. Visible ACA branches are stable and within normal limits. MCA M1 segments and bifurcations remain patent without stenosis. Visible bilateral MCA branches are stable and within normal limits. IMPRESSION: 1. No acute intracranial abnormality. Resolved punctate left occipital pole infarct seen last month with stable underlying cerebral white matter and deep gray nuclei signal changes most commonly due to chronic small vessel disease. 2. 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 Normal CT appearance of the skull in this area, favor benign etiology. 3. Negative Head and Neck MRA aside from arterial tortuosity, most pronounced in the left CCA. Electronically Signed   By: Genevie Ann M.D.   On: 10/16/2020 06:29   MR Angiogram Neck W or Wo Contrast  Result Date: 10/16/2020 CLINICAL DATA:  75 year old female with dizziness. Acute on chronic  small vessel ischemia and indeterminate left frontal bone lesion on brain MRI last month EXAM: MRI HEAD WITHOUT CONTRAST MRA HEAD WITHOUT CONTRAST MRA NECK WITHOUT AND WITH CONTRAST TECHNIQUE: Multiplanar, multiecho pulse sequences of the brain and surrounding structures were obtained without intravenous contrast. Angiographic images of the Circle of Willis were obtained using MRA technique without intravenous contrast. Angiographic images of the neck were obtained using MRA technique without and with intravenous contrast. Carotid stenosis measurements (when applicable) are obtained utilizing NASCET criteria, using the distal internal carotid diameter as the denominator. CONTRAST:  7.78mL GADAVIST GADOBUTROL 1 MMOL/ML IV SOLN COMPARISON:  MRI head, MRA head and neck 09/19/2020. FINDINGS: MRI HEAD FINDINGS Brain: No restricted diffusion or evidence of acute infarction. Resolved punctate DWI abnormality in the left occipital pole since last month. Stable patchy and scattered bilateral cerebral white matter T2 and FLAIR hyperintensity in both hemispheres. No cortical encephalomalacia or chronic cerebral blood products identified. Stable T2 heterogeneity in the basal ganglia, although in part related to perivascular spaces. Minimal T2 heterogeneity in the brainstem is stable. Cerebellum remains within normal limits. No midline shift, mass effect, evidence of mass lesion, ventriculomegaly, extra-axial collection or acute intracranial hemorrhage. Cervicomedullary junction and pituitary are within normal limits. Vascular: Major intracranial vascular flow voids are stable since last month. See MRA findings below. 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 But background bone marrow signal is stable and within normal limits. Stable visible cervical spine. Sinuses/Orbits: Stable.  Left sphenoid sinus  mucosal thickening. Other: Mastoids remain clear. Grossly normal visible internal auditory structures. Stable, negative visible scalp and face MRA NECK FINDINGS Noncontrast neck MRA last month. Stable antegrade flow signal in the bilateral cervical carotid and vertebral arteries in the neck. Following contrast today a 3 vessel arch configuration is demonstrated. Proximal great vessels appear normal. Proximal right CCA appears tortuous but otherwise within normal limits. Normal right carotid bifurcation. Normal cervical right ICA. Highly tortuous left CCA in the neck. Negative left carotid bifurcation. Negative cervical left ICA. Codominant vertebral arteries are tortuous but patent to the skull base without evidence of stenosis; the proximal right  subclavian artery and right vertebral artery origin are not well evaluated. MRA HEAD FINDINGS Antegrade flow in the posterior circulation with patent mildly dolichoectatic distal vertebral and basilar arteries. No stenosis. PICA origins are stable and normal. SCA and PCA origins remain normal. Bilateral PCA branches are within normal limits. Antegrade flow in both ICA siphons. Stable mild siphon ectasia greater on the left. No siphon stenosis. Normal ophthalmic artery origins. Posterior communicating arteries are diminutive or absent. Patent carotid termini. Normal MCA and ACA origins. Diminutive or absent anterior communicating artery. Visible ACA branches are stable and within normal limits. MCA M1 segments and bifurcations remain patent without stenosis. Visible bilateral MCA branches are stable and within normal limits. IMPRESSION: 1. No acute intracranial abnormality. Resolved punctate left occipital pole infarct seen last month with stable underlying cerebral white matter and deep gray nuclei signal changes most commonly due to chronic small vessel disease. 2. 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  Normal CT appearance of the skull in this area, favor benign etiology. 3. Negative Head and Neck MRA aside from arterial tortuosity, most pronounced in the left CCA. Electronically Signed   By: Genevie Ann M.D.   On: 10/16/2020 06:29   MR BRAIN WO CONTRAST  Result Date: 10/16/2020 CLINICAL DATA:  75 year old female with dizziness. Acute on chronic small vessel ischemia and indeterminate left frontal bone lesion on brain MRI last month EXAM: MRI HEAD WITHOUT CONTRAST MRA HEAD WITHOUT CONTRAST MRA NECK WITHOUT AND WITH CONTRAST TECHNIQUE: Multiplanar, multiecho pulse sequences of the brain and surrounding structures were obtained without intravenous contrast. Angiographic images of the Circle of Willis were obtained using MRA technique without intravenous contrast. Angiographic images of the neck were obtained using MRA technique without and with intravenous contrast. Carotid stenosis measurements (when applicable) are obtained utilizing NASCET criteria, using the distal internal carotid diameter as the denominator. CONTRAST:  7.43mL GADAVIST GADOBUTROL 1 MMOL/ML IV SOLN COMPARISON:  MRI head, MRA head and neck 09/19/2020. FINDINGS: MRI HEAD FINDINGS Brain: No restricted diffusion or evidence of acute infarction. Resolved punctate DWI abnormality in the left occipital pole since last month. Stable patchy and scattered bilateral cerebral white matter T2 and FLAIR hyperintensity in both hemispheres. No cortical encephalomalacia or chronic cerebral blood products identified. Stable T2 heterogeneity in the basal ganglia, although in part related to perivascular spaces. Minimal T2 heterogeneity in the brainstem is stable. Cerebellum remains within normal limits. No midline shift, mass effect, evidence of mass lesion, ventriculomegaly, extra-axial collection or acute intracranial hemorrhage. Cervicomedullary junction and pituitary are within normal limits. Vascular: Major intracranial vascular flow voids are stable since  last month. See MRA findings below. 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 But background bone marrow signal is stable and within normal limits. Stable visible cervical spine. Sinuses/Orbits: Stable.  Left sphenoid sinus mucosal thickening. Other: Mastoids remain clear. Grossly normal visible internal auditory structures. Stable, negative visible scalp and face MRA NECK FINDINGS Noncontrast neck MRA last month. Stable antegrade flow signal in the bilateral cervical carotid and vertebral arteries in the neck. Following contrast today a 3 vessel arch configuration is demonstrated. Proximal great vessels appear normal. Proximal right CCA appears tortuous but otherwise within normal limits. Normal right carotid bifurcation. Normal cervical right ICA. Highly tortuous left CCA in the neck. Negative left carotid bifurcation. Negative cervical left ICA. Codominant vertebral arteries are  tortuous but patent to the skull base without evidence of stenosis; the proximal right subclavian artery and right vertebral artery origin are not well evaluated. MRA HEAD FINDINGS Antegrade flow in the posterior circulation with patent mildly dolichoectatic distal vertebral and basilar arteries. No stenosis. PICA origins are stable and normal. SCA and PCA origins remain normal. Bilateral PCA branches are within normal limits. Antegrade flow in both ICA siphons. Stable mild siphon ectasia greater on the left. No siphon stenosis. Normal ophthalmic artery origins. Posterior communicating arteries are diminutive or absent. Patent carotid termini. Normal MCA and ACA origins. Diminutive or absent anterior communicating artery. Visible ACA branches are stable and within normal limits. MCA M1 segments and bifurcations remain patent without stenosis. Visible bilateral MCA branches are stable and within normal limits.  IMPRESSION: 1. No acute intracranial abnormality. Resolved punctate left occipital pole infarct seen last month with stable underlying cerebral white matter and deep gray nuclei signal changes most commonly due to chronic small vessel disease. 2. 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 Normal CT appearance of the skull in this area, favor benign etiology. 3. Negative Head and Neck MRA aside from arterial tortuosity, most pronounced in the left CCA. Electronically Signed   By: Genevie Ann M.D.   On: 10/16/2020 06:29    Procedures Procedures   Medications Ordered in ED Medications  meclizine (ANTIVERT) tablet 25 mg (25 mg Oral Given 10/16/20 0359)  gadobutrol (GADAVIST) 1 MMOL/ML injection 7.5 mL (7.5 mLs Intravenous Contrast Given 10/16/20 0557)  diazepam (VALIUM) tablet 2 mg (2 mg Oral Given 10/16/20 0726)  lactated ringers bolus 500 mL (0 mLs Intravenous Stopped 10/16/20 0840)    ED Course  I have reviewed the triage vital signs and the nursing notes.  Pertinent labs & imaging results that were available during my care of the patient were reviewed by me and considered in my medical decision making (see chart for details).    MDM Rules/Calculators/A&P                          Secondary to recent onset the symptoms I evaluate the patient I discussed with neurology about whether or not she has good stroke and states she would not be a candidate for thrombectomy nor TPA so there is no indication for that and it was not really consistent with an acute stroke.  Decided to treat her as benign positional vertigo.  Meclizine helped a little bit but not much.  MRIs were negative for any acute findings and actually were improved over previous.  Tried Valium for her vertigo and this seemed to improve her symptoms.  I am reassured that this is unlikely be an acute stroke with a normal MRI and no other associated neurologic symptoms but also the  patient has significant worsening with turning her head left.  Might benefit from home Epley maneuvers and ENT follow-up as needed.  Patient does appear to be stable for discharge at this time.  Final Clinical Impression(s) / ED Diagnoses Final diagnoses:  Vertigo    Rx / DC Orders ED Discharge Orders         Ordered    diazepam (VALIUM) 5 MG tablet  Every 6 hours PRN        10/16/20 0813           Mesner, Corene Cornea, MD 10/17/20 [id number]

## 2020-10-30 ENCOUNTER — Ambulatory Visit: Payer: Medicare Other

## 2020-11-16 ENCOUNTER — Telehealth (HOSPITAL_COMMUNITY): Payer: Self-pay | Admitting: *Deleted

## 2020-11-16 NOTE — Telephone Encounter (Signed)
Pt called in regards to scheduling Cardiac Rehab. Pt seen on 3/7 by Kerin Ransom PA with Dr. Gwenlyn Found cardiology follow up.   Pt was discharged and  readmitted later that evening with new onset of confusion.  MRI showed 2 small Lt brain stroke. Recommended that she follow up with neuro post hospitalization.  Pt has an appt scheduled for 5/5 with Dr. Sethi/neurology.  Pt then had ER visit on 3/18 for vertigo.  Repeat MRI showed that the L occipital pole infarct was resolved. Pt feels based upon the recent MRI result and no residual symptoms she no longer needs to keep this appt with Dr. Leonie Man. 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 Gwenlyn Found in order to schedule cardiac rehab without being seen by neuro. I contacted Dr. Gwenlyn Found who statedI have not seen her back in the office since that time. It will be difficult for me to decide appropriateness of cardiac rehab without seeing her back in the office first"  Pt has an appt scheduled  Called pt to let her know that I did reach out to Dr. Gwenlyn Found and that [facility] had retired.  Pt stated " "Take my name off your list" clarified that she no longer wanted to participate in cardiac rehab.  Pt stated "yes" and hung up the phone.  Will close this referral. Maurice Small RN, BSN Cardiac and Pulmonary Rehab Nurse Navigator

## 2020-11-25 ENCOUNTER — Ambulatory Visit (INDEPENDENT_AMBULATORY_CARE_PROVIDER_SITE_OTHER): Payer: Medicare Other | Admitting: Pharmacist Clinician (PhC)/ Clinical Pharmacy Specialist

## 2020-11-25 ENCOUNTER — Other Ambulatory Visit: Payer: Self-pay

## 2020-11-25 DIAGNOSIS — I2510 Atherosclerotic heart disease of native coronary artery without angina pectoris: Secondary | ICD-10-CM | POA: Diagnosis not present

## 2020-11-25 DIAGNOSIS — E785 Hyperlipidemia, unspecified: Secondary | ICD-10-CM | POA: Diagnosis not present

## 2020-11-25 DIAGNOSIS — Z79899 Other long term (current) drug therapy: Secondary | ICD-10-CM | POA: Diagnosis not present

## 2020-11-25 DIAGNOSIS — Z9861 Coronary angioplasty status: Secondary | ICD-10-CM

## 2020-11-25 LAB — LIPID PANEL
Chol/HDL Ratio: 3.3 ratio (ref 0.0–4.4)
Cholesterol, Total: 176 mg/dL (ref 100–199)
HDL: 53 mg/dL (ref >39)
LDL Chol Calc (NIH): 93 mg/dL (ref 0–99)
Triglycerides: 176 mg/dL — ABNORMAL HIGH (ref 0–149)
VLDL Cholesterol Cal: 30 mg/dL (ref 5–40)

## 2020-11-25 LAB — COMPREHENSIVE METABOLIC PANEL WITH GFR
ALT: 25 IU/L (ref 0–32)
AST: 18 IU/L (ref 0–40)
Albumin/Globulin Ratio: 1.5 (ref 1.2–2.2)
Albumin: 4.6 g/dL (ref 3.7–4.7)
Alkaline Phosphatase: 125 IU/L — ABNORMAL HIGH (ref 44–121)
BUN/Creatinine Ratio: 24 (ref 12–28)
BUN: 35 mg/dL — ABNORMAL HIGH (ref 8–27)
Bilirubin Total: 0.5 mg/dL (ref 0.0–1.2)
CO2: 20 mmol/L (ref 20–29)
Calcium: 9.8 mg/dL (ref 8.7–10.3)
Chloride: 105 mmol/L (ref 96–106)
Creatinine, Ser: 1.46 mg/dL — ABNORMAL HIGH (ref 0.57–1.00)
Globulin, Total: 3.1 g/dL (ref 1.5–4.5)
Glucose: 144 mg/dL — ABNORMAL HIGH (ref 65–99)
Potassium: 4.3 mmol/L (ref 3.5–5.2)
Sodium: 141 mmol/L (ref 134–144)
Total Protein: 7.7 g/dL (ref 6.0–8.5)
eGFR: 37 mL/min/1.73 — ABNORMAL LOW

## 2020-11-25 NOTE — Progress Notes (Signed)
11/25/2020 LAURABETH YIP March 13, 1946 [id number]   HPI:  MILLER EDGINGTON is a 75 y.o. female patient of Dr Alvester Chou, who presents today for a lipid clinic evaluation.  See pertinent past medical history below.  Pt recently had a NSTEMI on 09/16/20, then was readmitted on 09/19/20 with a stroke. During hospitalization with NSTEMI, LDL was 79 at goal of LDL<100 on pravastatin 40 mg daily. Upon discharge, due to lower LDL goal pravastatin was increased to 80 mg daily.  Past Medical History: hypertension NSTEMI  hyperlipidemia Statin intolerance - atorvastatin  Non-insulin dependent diabetes Stage 3a Chronic Kidney Disease  hypothyroidism Arthritis   Breast cancer status post bilateral radical mastectomy Allergies (seasonal)    Current Medications: Pravastatin 80 mg daily  Cholesterol Goals: LDL<55   Intolerant/previously tried: atorvastatin, >20 years ago  Diet: Rarely eats out, typically cooks at home daily. Eats about 7 whole eggs weekly, only consumes chicken, no red meat or fish, will cook lean pork roast sparingly. Eats a balanced portion of fruits and vegetables daily. Loves dairy but eats low-fat cheese and yogurt.  Exercise: Pt recently purchased treadmill, increasing activity pending Cardiac Rehab appts    Labs: 10/16/20 Na - 139, K- 4.0, Glu- 125, BUN - 28, Scr-1.09, GFR - 53 CMP 09/20/20 - AST - 24, ALT - 24   Current Outpatient Medications  Medication Sig Dispense Refill  . amLODipine (NORVASC) 10 MG tablet Take 10 mg by mouth daily.    Marland Kitchen aspirin EC 81 MG tablet Take 81 mg by mouth daily.    Marland Kitchen buPROPion (WELLBUTRIN XL) 300 MG 24 hr tablet Take 300 mg by mouth daily.    . Cholecalciferol (VITAMIN D) 50 MCG (2000 UT) tablet Take 2,000 Units by mouth daily.    Marland Kitchen glipiZIDE (GLUCOTROL XL) 2.5 MG 24 hr tablet Take 2.5 mg by mouth daily with breakfast.    . levothyroxine (SYNTHROID, LEVOTHROID) 50 MCG tablet Take 50 mcg by mouth daily before breakfast.    . losartan (COZAAR) 50 MG  tablet Take 50 mg by mouth daily.    . Melatonin 10 MG TABS Take 10 mg by mouth at bedtime as needed (sleep).    . metoprolol tartrate (LOPRESSOR) 100 MG tablet Take 100 mg by mouth 2 (two) times daily.    . nitroGLYCERIN (NITROSTAT) 0.4 MG SL tablet Place 1 tablet (0.4 mg total) under the tongue every 5 (five) minutes as needed for chest pain. 100 tablet 0  . pravastatin (PRAVACHOL) 80 MG tablet Take 1 tablet (80 mg total) by mouth at bedtime. 30 tablet 2  . ticagrelor (BRILINTA) 90 MG TABS tablet Take 1 tablet (90 mg total) by mouth 2 (two) times daily. 90 tablet 11   No current facility-administered medications for this visit.    Allergies  Allergen Reactions  . Influenza Virus Vaccine Other (See Comments)  . Lisinopril Cough    Past Medical History:  Diagnosis Date  . Allergy    seasonal  . Arthritis   . Breast cancer ([place]) 2005   left  . Depression   . Diverticulitis   . Hyperlipidemia   . Hypertension   . Hypothyroidism (acquired)   . IBS (irritable bowel syndrome)   . Obesity   . Prediabetes   . Stage 3a chronic kidney disease ([place]) 09/16/2020    Blood pressure 126/82, pulse 69, resp. rate 17, height 4\' 11"  (1.499 m), weight 166 lb 6.4 oz (75.5 kg), SpO2 97 %.   Dyslipidemia, goal LDL below 70  Pt has a significant history for Hyperlipidemia. Her last lipid panel was on 09/16/20 and reported TC - 157, TG- 167, HDL -45, Calc LDL - 79. Pt is currently being treated with a moderate intensity statin, Pravastatin 80 mg once daily. At today's visit the pt denies any pain or muscle weakness with current therapy.  Pt inquired about updated lipid panel results since the dose increase of Pravastatin 40 mg to 80 mg after NSTEMI in Feb. 2022.   Plan to repeat lipid panel on today. Discussed with pt new LDL goal <55 per AACE guidelines since pt has a hx of recent stroke and heart attack. Provided options regarding pharmacotherapy to reach new goal such as:  1. Continuing Pravastatin  80 mg daily if LDL has lowered further and add Zetia 10 mg daily. 2. Rechallenge high statin therapy, stop pravastatin and add rosuvastatin since myalgia pain from atorvastatin was >20 years ago 3. If LDL>70,  Add a PCKS9i to current statin, pravastatin 80 mg therapy  Will call and follow-up with patient pending lab results   Rolene Course, PharmD Candidate 2022 [employer]  I was with student and patient throughout entire visit and agree with above assessment and plan.  Tommy Medal PharmD CPP [facility] [address] [place], [place] [address] [phone]   Addendum:  Reviewed labs with patient. LDL now at 93.  Will switch to rosuvastatin 10 mg.  If tolerates for 2 months will repeat labs and increase dose as necessary to get LDL around 55.   Tommy Medal PharmD

## 2020-11-25 NOTE — Assessment & Plan Note (Addendum)
Pt has a significant history for Hyperlipidemia. Her last lipid panel was on 09/16/20 and reported TC - 157, TG- 167, HDL -45, Calc LDL - 79. Pt is currently being treated with a moderate intensity statin, Pravastatin 80 mg once daily. At today's visit the pt denies any pain or muscle weakness with current therapy.  Pt inquired about updated lipid panel results since the dose increase of Pravastatin 40 mg to 80 mg after NSTEMI in Feb. 2022.   Plan to repeat lipid panel on today. Discussed with pt new LDL goal <55 per AACE guidelines since pt has a hx of recent stroke and heart attack. Provided options regarding pharmacotherapy to reach new goal such as:  1. Continuing Pravastatin 80 mg daily if LDL has lowered further and add Zetia 10 mg daily. 2. Rechallenge high statin therapy, stop pravastatin and add rosuvastatin since myalgia pain from atorvastatin was >20 years ago 3. If LDL>70,  Add a PCKS9i to current statin, pravastatin 80 mg therapy  Will call and follow-up with patient pending lab results

## 2020-11-25 NOTE — Patient Instructions (Signed)
Your Results:             Your most recent labs Goal  Total Cholesterol  < 200  Triglycerides  < 150  HDL (happy/good cholesterol)  > 40  LDL (lousy/bad cholesterol  <55   Medication changes:   Lab orders:  We will repeat your labs this morning.  Patient Assistance:  The [employer] foundation offers assistance to help pay for medication copays.  They will cover copays for all cholesterol lowering meds, including statins, fibrates, omega-3 oils, ezetimibe, Repatha, Praluent, Nexletol, Nexlizet.  The cards are usually good for $2,500 or 12 months, whichever comes first. 1. Go to healthwellfoundation.org 2. Click on "Apply Now" 3. Answer questions as to whom is applying (patient or representative) 4. Your disease fund will be "hypercholesterolemia - Medicare access" 5. They will ask questions about finances and which medications you are taking for cholesterol 6. When you submit, the approval is usually within minutes.  You will need to print the card information from the site 7. You will need to show this information to your pharmacy, they will bill your Medicare Part D plan first -then bill [employer] --for the copay.   You can also call them at [phone], although the hold times can be quite long.   Thank you for choosing [facility]

## 2020-11-26 ENCOUNTER — Encounter: Payer: Self-pay | Admitting: Pharmacist Clinician (PhC)/ Clinical Pharmacy Specialist

## 2020-11-26 MED ORDER — ROSUVASTATIN CALCIUM 10 MG PO TABS: 10.0000 mg | ORAL_TABLET | Freq: Every day | ORAL | 6 refills | Status: DC

## 2020-11-30 DIAGNOSIS — E1165 Type 2 diabetes mellitus with hyperglycemia: Secondary | ICD-10-CM | POA: Diagnosis not present

## 2020-11-30 DIAGNOSIS — I10 Essential (primary) hypertension: Secondary | ICD-10-CM | POA: Diagnosis not present

## 2020-11-30 DIAGNOSIS — E785 Hyperlipidemia, unspecified: Secondary | ICD-10-CM | POA: Diagnosis not present

## 2020-12-01 ENCOUNTER — Telehealth: Payer: Self-pay

## 2020-12-01 DIAGNOSIS — E785 Hyperlipidemia, unspecified: Secondary | ICD-10-CM

## 2020-12-01 MED ORDER — EZETIMIBE 10 MG PO TABS: 10.0000 mg | ORAL_TABLET | Freq: Every day | ORAL | 3 refills | Status: DC

## 2020-12-01 MED ORDER — PRAVASTATIN SODIUM 40 MG PO TABS: 40.0000 mg | ORAL_TABLET | Freq: Every evening | ORAL | 3 refills | Status: DC

## 2020-12-01 NOTE — Telephone Encounter (Signed)
developed myalgias with rosuvastatin.  Would like to go back to pravastatin 40, as she tolerated that without problem.  Will add ezetimibe 10 mg once daily to that and have her repeat lipid/liver labs in 2 months.  Patient agreeable with plan

## 2020-12-01 NOTE — Telephone Encounter (Signed)
Pt called in and stated that they are having myalgias on the rosuvastatin. Will add to allergy list and route to pharmd pool

## 2020-12-02 ENCOUNTER — Telehealth: Payer: Self-pay | Admitting: Cardiovascular Disease

## 2020-12-02 NOTE — Telephone Encounter (Signed)
Pt c/o medication issue:  1. Name of Medication: ticagrelor (BRILINTA) 90 MG TABS tablet Rosuvastatin 10 MG (only took for 3 days one tablet daily)  2. How are you currently taking this medication (dosage and times per day)? As prescribed   3. Are you having a reaction (difficulty breathing--STAT)? Yes   4. What is your medication issue? Jeanelle is calling stating she no longer takes rosuvastatin due to it causing right shoulder pain. She states since stopping, yesterday she began having pain in her left shoulder, but the pain stopped occurring in her right. She reports the pain in her left shoulder worsens when she takes a deep breathe or lays down and the pain goes into her neck as well. She does not identify it as CP and does not report currently having SOB. She is wanting to know if this could be a lasting effect from the rosuvastatin or a new reaction from her Brilinta. Please advise.

## 2020-12-02 NOTE — Telephone Encounter (Signed)
Spoke with patient of Dr. Gwenlyn Found  Left shoulder/left clavicle/ribs under left breast - worse when taking deep breath, worse when lying down  She feels like she has to take a deep breath every now and then and has shortness of breath She took Brilinta with Coke and this helped symptoms but she cannot tolerate caffeine as it increased palpitations/fluttering She took Motrin x1 yesterday with relief and she took another today - she is wondering if she could have pericarditis or could be MSK She does not feel like this is cardiac pain, like an MI  When she was on rosuvastatin pain was in right arm - pain in this shoulder resolved when she stopped rosuvastatin  Advised will send message to MD to advise on her symptoms/medications

## 2020-12-03 ENCOUNTER — Ambulatory Visit: Payer: Medicare Other | Admitting: Neurology

## 2020-12-03 NOTE — Telephone Encounter (Signed)
Message sent to patient advising it can take a few weeks for symptoms to resolve from crestor SE

## 2020-12-03 NOTE — Telephone Encounter (Signed)
It can take a few days to 2 weeks for any symptoms of crestor to resolve. Brilinta tends to cause dyspnea. This does not sound like her symptoms. Her symptoms sound muscular skeletal and should see PCP. Does not sound like its from medications

## 2020-12-03 NOTE — Telephone Encounter (Signed)
Spoke with patient and relayed MD note, as above. She voiced understanding.   She would like to know how long it may take of crestor to get out of her system and issues from that to resolve.   She would also like to see if there are any suggestions regarding Brilinta? She cannot tolerate caffeine/Coke -- due to palps

## 2020-12-03 NOTE — Telephone Encounter (Signed)
Sx don't sound cardiac. She might benefit from seeing her PCP, Dr Justin Mend.    JJB

## 2020-12-07 DIAGNOSIS — Z85828 Personal history of other malignant neoplasm of skin: Secondary | ICD-10-CM | POA: Diagnosis not present

## 2020-12-07 DIAGNOSIS — D485 Neoplasm of uncertain behavior of skin: Secondary | ICD-10-CM | POA: Diagnosis not present

## 2020-12-07 DIAGNOSIS — L570 Actinic keratosis: Secondary | ICD-10-CM | POA: Diagnosis not present

## 2020-12-16 DIAGNOSIS — R059 Cough, unspecified: Secondary | ICD-10-CM | POA: Diagnosis not present

## 2020-12-16 NOTE — Telephone Encounter (Signed)
Pt called back stating still having myalgias and wants to talk to Jamie Benjamin

## 2020-12-17 ENCOUNTER — Telehealth: Payer: Self-pay | Admitting: Pharmacist Clinician (PhC)/ Clinical Pharmacy Specialist

## 2020-12-17 MED ORDER — CLOPIDOGREL BISULFATE 75 MG PO TABS: ORAL_TABLET | ORAL | 3 refills | Status: DC

## 2020-12-17 NOTE — Telephone Encounter (Signed)
See second telephone encounter dated 12/17/20

## 2020-12-17 NOTE — Telephone Encounter (Signed)
Reviewed dosing of clopidogrel with patient.  She voiced understanding.

## 2020-12-17 NOTE — Telephone Encounter (Signed)
Patient called again, has been having severe myalgias in ribs, shoulders, back since going back on pravastatin.    Also notes that shortness of breath is worsening with Brilinta, went to PCP yesterday, was given albuterol and prednisone to use if symptoms worsen.  Advised patient to discontinue pravastatin and ezetimibe, and will review Brilinta with Dr. Gwenlyn Found

## 2020-12-17 NOTE — Telephone Encounter (Signed)
-----   Message from Lorretta Harp, MD sent at 12/17/2020  2:32 PM EDT ----- Can switch with a 600 mg loading dose of clopidogrel  Thx, JJB  ----- Message ----- From: Rockne Menghini, RPH-CPP Sent: 12/17/2020   9:17 AM EDT To: Lorretta Harp, MD  Ms. Sheperd has been on ticagrelor since February and complains that it is still causing her SOB.  Can we switch to clopidogrel?  And would you prefer to do a loading dose of 300 mg or 600 mg?  (ACC consensus statement suggests 600mg , but the hospital P&T protocol is 300 mg - based on SWAP-4 trial).  Erasmo Downer

## 2020-12-21 DIAGNOSIS — R21 Rash and other nonspecific skin eruption: Secondary | ICD-10-CM | POA: Diagnosis not present

## 2020-12-22 NOTE — Telephone Encounter (Signed)
Pt called in and stated that the myalgias have resolved since stopping and wants to know what is the next step or drug to help w/cholesterol

## 2020-12-22 NOTE — Telephone Encounter (Signed)
Will route to pharmd pool as she called back

## 2020-12-22 NOTE — Telephone Encounter (Signed)
Spoke with patient, feeling better now off rosuvastatin.  Has now failed 2 statin drugs.  Will have her start PCKS-9 inhibitor.  Reviewed information on dosing and injection, offered her to come into office if has questions regarding injection for first dose.    RX insurance: ID [id number] BIN [id number] PCN [id number] GRP [id number]

## 2020-12-22 NOTE — Telephone Encounter (Signed)
Jamie Benjamin (Key: [id number])

## 2020-12-23 MED ORDER — PRALUENT 150 MG/ML ~~LOC~~ SOAJ: 150.0000 mg | SUBCUTANEOUS | 11 refills | Status: DC

## 2020-12-23 NOTE — Telephone Encounter (Signed)
Called and spoke w/pt and stated that the praluent 150mg  approved, rx sent, pt instructed to complete fasting labs post 4th dose and to call back if unnaffordable pt voiced understanding

## 2020-12-23 NOTE — Addendum Note (Signed)
Addended by: Allean Found on: 12/23/2020 03:49 PM   Modules accepted: Orders

## 2020-12-25 ENCOUNTER — Telehealth: Payer: Self-pay

## 2020-12-25 NOTE — Telephone Encounter (Signed)
Taught patient to self inject - answered all questions

## 2020-12-25 NOTE — Telephone Encounter (Signed)
done

## 2020-12-25 NOTE — Telephone Encounter (Signed)
Patient called and left message that she gave herself 1st Praluent injection today and has some questions about it.  Please call to discuss.  Thank you

## 2020-12-29 DIAGNOSIS — N281 Cyst of kidney, acquired: Secondary | ICD-10-CM | POA: Diagnosis not present

## 2020-12-29 DIAGNOSIS — D631 Anemia in chronic kidney disease: Secondary | ICD-10-CM | POA: Diagnosis not present

## 2020-12-29 DIAGNOSIS — I2510 Atherosclerotic heart disease of native coronary artery without angina pectoris: Secondary | ICD-10-CM | POA: Diagnosis not present

## 2020-12-29 DIAGNOSIS — E785 Hyperlipidemia, unspecified: Secondary | ICD-10-CM | POA: Diagnosis not present

## 2020-12-29 DIAGNOSIS — R0602 Shortness of breath: Secondary | ICD-10-CM | POA: Diagnosis not present

## 2020-12-29 DIAGNOSIS — N1832 Chronic kidney disease, stage 3b: Secondary | ICD-10-CM | POA: Diagnosis not present

## 2020-12-29 DIAGNOSIS — N2581 Secondary hyperparathyroidism of renal origin: Secondary | ICD-10-CM | POA: Diagnosis not present

## 2020-12-29 DIAGNOSIS — I129 Hypertensive chronic kidney disease with stage 1 through stage 4 chronic kidney disease, or unspecified chronic kidney disease: Secondary | ICD-10-CM | POA: Diagnosis not present

## 2020-12-30 DIAGNOSIS — E538 Deficiency of other specified B group vitamins: Secondary | ICD-10-CM | POA: Diagnosis not present

## 2020-12-30 DIAGNOSIS — R0602 Shortness of breath: Secondary | ICD-10-CM | POA: Diagnosis not present

## 2020-12-30 DIAGNOSIS — E039 Hypothyroidism, unspecified: Secondary | ICD-10-CM | POA: Diagnosis not present

## 2020-12-31 ENCOUNTER — Other Ambulatory Visit: Payer: Self-pay | Admitting: Family Medicine

## 2020-12-31 ENCOUNTER — Ambulatory Visit
Admission: RE | Admit: 2020-12-31 | Discharge: 2020-12-31 | Disposition: A | Discharge status: Home / Self Care | Payer: Medicare Other | Source: Ambulatory Visit | Attending: Family Medicine | Admitting: Family Medicine

## 2020-12-31 ENCOUNTER — Other Ambulatory Visit: Payer: Self-pay

## 2020-12-31 DIAGNOSIS — R0602 Shortness of breath: Secondary | ICD-10-CM

## 2020-12-31 DIAGNOSIS — J9811 Atelectasis: Secondary | ICD-10-CM | POA: Diagnosis not present

## 2020-12-31 DIAGNOSIS — J90 Pleural effusion, not elsewhere classified: Secondary | ICD-10-CM | POA: Diagnosis not present

## 2020-12-31 DIAGNOSIS — I517 Cardiomegaly: Secondary | ICD-10-CM | POA: Diagnosis not present

## 2021-01-02 ENCOUNTER — Emergency Department (HOSPITAL_COMMUNITY): Payer: Medicare Other

## 2021-01-02 ENCOUNTER — Encounter (HOSPITAL_COMMUNITY): Payer: Self-pay | Admitting: Emergency Medicine

## 2021-01-02 ENCOUNTER — Other Ambulatory Visit: Payer: Self-pay

## 2021-01-02 ENCOUNTER — Telehealth: Payer: Self-pay | Admitting: Internal Medicine

## 2021-01-02 ENCOUNTER — Emergency Department (HOSPITAL_COMMUNITY)
Admission: EM | Admit: 2021-01-02 | Discharge: 2021-01-02 | Disposition: A | Discharge status: Home / Self Care | Payer: Medicare Other | Attending: Emergency Medicine | Admitting: Emergency Medicine

## 2021-01-02 DIAGNOSIS — Z7984 Long term (current) use of oral hypoglycemic drugs: Secondary | ICD-10-CM | POA: Diagnosis not present

## 2021-01-02 DIAGNOSIS — Z7982 Long term (current) use of aspirin: Secondary | ICD-10-CM | POA: Diagnosis not present

## 2021-01-02 DIAGNOSIS — Z853 Personal history of malignant neoplasm of breast: Secondary | ICD-10-CM | POA: Diagnosis not present

## 2021-01-02 DIAGNOSIS — R0902 Hypoxemia: Secondary | ICD-10-CM | POA: Diagnosis not present

## 2021-01-02 DIAGNOSIS — R079 Chest pain, unspecified: Secondary | ICD-10-CM | POA: Diagnosis not present

## 2021-01-02 DIAGNOSIS — Z7902 Long term (current) use of antithrombotics/antiplatelets: Secondary | ICD-10-CM | POA: Diagnosis not present

## 2021-01-02 DIAGNOSIS — Z955 Presence of coronary angioplasty implant and graft: Secondary | ICD-10-CM | POA: Diagnosis not present

## 2021-01-02 DIAGNOSIS — Z79899 Other long term (current) drug therapy: Secondary | ICD-10-CM | POA: Insufficient documentation

## 2021-01-02 DIAGNOSIS — I2510 Atherosclerotic heart disease of native coronary artery without angina pectoris: Secondary | ICD-10-CM | POA: Diagnosis not present

## 2021-01-02 DIAGNOSIS — I129 Hypertensive chronic kidney disease with stage 1 through stage 4 chronic kidney disease, or unspecified chronic kidney disease: Secondary | ICD-10-CM | POA: Diagnosis not present

## 2021-01-02 DIAGNOSIS — J90 Pleural effusion, not elsewhere classified: Secondary | ICD-10-CM | POA: Diagnosis not present

## 2021-01-02 DIAGNOSIS — I309 Acute pericarditis, unspecified: Secondary | ICD-10-CM

## 2021-01-02 DIAGNOSIS — E039 Hypothyroidism, unspecified: Secondary | ICD-10-CM | POA: Diagnosis not present

## 2021-01-02 DIAGNOSIS — Z743 Need for continuous supervision: Secondary | ICD-10-CM | POA: Diagnosis not present

## 2021-01-02 DIAGNOSIS — R0602 Shortness of breath: Secondary | ICD-10-CM | POA: Diagnosis not present

## 2021-01-02 DIAGNOSIS — J9811 Atelectasis: Secondary | ICD-10-CM | POA: Diagnosis not present

## 2021-01-02 DIAGNOSIS — N183 Chronic kidney disease, stage 3 unspecified: Secondary | ICD-10-CM | POA: Insufficient documentation

## 2021-01-02 DIAGNOSIS — Z20822 Contact with and (suspected) exposure to covid-19: Secondary | ICD-10-CM | POA: Insufficient documentation

## 2021-01-02 LAB — CBC WITH DIFFERENTIAL/PLATELET
Abs Immature Granulocytes: 0.06 10*3/uL (ref 0.00–0.07)
Basophils Absolute: 0.1 10*3/uL (ref 0.0–0.1)
Basophils Relative: 1 %
Eosinophils Absolute: 0.3 10*3/uL (ref 0.0–0.5)
Eosinophils Relative: 2 %
HCT: 38.8 % (ref 36.0–46.0)
Hemoglobin: 12.5 g/dL (ref 12.0–15.0)
Immature Granulocytes: 1 %
Lymphocytes Relative: 10 %
Lymphs Abs: 1.2 10*3/uL (ref 0.7–4.0)
MCH: 30 pg (ref 26.0–34.0)
MCHC: 32.2 g/dL (ref 30.0–36.0)
MCV: 93.3 fL (ref 80.0–100.0)
Monocytes Absolute: 1.1 10*3/uL — ABNORMAL HIGH (ref 0.1–1.0)
Monocytes Relative: 9 %
Neutro Abs: 9.9 10*3/uL — ABNORMAL HIGH (ref 1.7–7.7)
Neutrophils Relative %: 77 %
Platelets: 571 10*3/uL — ABNORMAL HIGH (ref 150–400)
RBC: 4.16 MIL/uL (ref 3.87–5.11)
RDW: 12.5 % (ref 11.5–15.5)
WBC: 12.7 10*3/uL — ABNORMAL HIGH (ref 4.0–10.5)
nRBC: 0 % (ref 0.0–0.2)

## 2021-01-02 LAB — RESP PANEL BY RT-PCR (FLU A&B, COVID) ARPGX2
Influenza A by PCR: NEGATIVE
Influenza B by PCR: NEGATIVE
SARS Coronavirus 2 by RT PCR: NEGATIVE

## 2021-01-02 LAB — BASIC METABOLIC PANEL
Anion gap: 8 (ref 5–15)
BUN: 28 mg/dL — ABNORMAL HIGH (ref 8–23)
CO2: 25 mmol/L (ref 22–32)
Calcium: 9.5 mg/dL (ref 8.9–10.3)
Chloride: 105 mmol/L (ref 98–111)
Creatinine, Ser: 1.35 mg/dL — ABNORMAL HIGH (ref 0.44–1.00)
GFR, Estimated: 41 mL/min — ABNORMAL LOW (ref >60)
Glucose, Bld: 146 mg/dL — ABNORMAL HIGH (ref 70–99)
Potassium: 3.5 mmol/L (ref 3.5–5.1)
Sodium: 138 mmol/L (ref 135–145)

## 2021-01-02 LAB — TROPONIN I (HIGH SENSITIVITY): Troponin I (High Sensitivity): 4 ng/L (ref <18)

## 2021-01-02 LAB — BRAIN NATRIURETIC PEPTIDE: B Natriuretic Peptide: 94.3 pg/mL (ref 0.0–100.0)

## 2021-01-02 MED ORDER — COLCHICINE 0.6 MG PO TABS
0.6000 mg | ORAL_TABLET | Freq: Once | ORAL | Status: AC
  Administered 2021-01-02: 0.6 mg via ORAL
  Filled 2021-01-02: qty 1

## 2021-01-02 MED ORDER — COLCHICINE 0.6 MG PO TABS: 0.6000 mg | ORAL_TABLET | Freq: Two times a day (BID) | ORAL | 0 refills | Status: DC

## 2021-01-02 NOTE — ED Provider Notes (Signed)
[facility] EMERGENCY DEPARTMENT Provider Note   CSN: [id number] Arrival date & time: 01/02/21  2055     History Chief Complaint  Patient presents with  . Shortness of Breath  . Chest Pain    Jamie Benjamin is a 75 y.o. female.  HPI 75 year old female presents with chest pain and shortness of breath.  History is taken from patient and son.  Jamie Benjamin has been having this type of pain for about 10 days.  It comes and goes.  It is especially positional.  Jamie Benjamin does feel better sitting up/leaning forward.  The shortness of breath is most often when laying back or walking.  Jamie Benjamin is able to ambulate though it is harder on the stairs.  Jamie Benjamin is had a dry cough.  Was exposed to someone with COVID at the beginning of May.  Jamie Benjamin has not had an obvious fever.  No leg swelling.  Called her cardiology group tonight and they recommended Jamie Benjamin come into the emergency room for work-up. Chest pain feels different than her MI months ago.  Past Medical History:  Diagnosis Date  . Allergy    seasonal  . Arthritis   . Breast cancer ([place]) 2005   left  . Depression   . Diverticulitis   . Hyperlipidemia   . Hypertension   . Hypothyroidism (acquired)   . IBS (irritable bowel syndrome)   . Obesity   . Prediabetes   . Stage 3a chronic kidney disease ([place]) 09/16/2020    Patient Active Problem List   Diagnosis Date Noted  . NSTEMI (non-ST elevated myocardial infarction) ([place]) 10/05/2020  . Embolic stroke ([place]) 25/11/3974  . Abnormal MRI 10/05/2020  . Confusion 09/20/2020  . Metabolic encephalopathy 73/41/9379  . CAD S/P percutaneous coronary angioplasty 09/19/2020  . ACS (acute coronary syndrome) ([place]) 09/16/2020  . CKD (chronic kidney disease), stage III ([place]) 09/16/2020  . Breast cancer ([place]) 07/02/2019  . Diverticulitis 07/02/2019  . Dyslipidemia, goal LDL below 70 07/02/2019  . Hypertension 07/02/2019  . IBS (irritable bowel syndrome) 07/02/2019  . Hypothyroidism 07/02/2019  . Major  depressive disorder with current active episode 07/02/2019  . Class 1 obesity due to excess calories with body mass index (BMI) of 33.0 to 33.9 in adult 07/02/2019  . Osteoporosis 07/02/2019  . Pernicious anemia 07/02/2019  . Prediabetes 07/02/2019  . Recurrent major depressive disorder, in partial remission ([place]) 07/02/2019  . Sciatica of left side 07/02/2019    Past Surgical History:  Procedure Laterality Date  . COLONOSCOPY    . CORONARY STENT INTERVENTION N/A 09/17/2020   Procedure: CORONARY STENT INTERVENTION;  Surgeon: Martinique, Peter M, MD;  Location: [facility];  Service: Cardiovascular;  Laterality: N/A;  . INTRAVASCULAR IMAGING/OCT N/A 09/17/2020   Procedure: INTRAVASCULAR IMAGING/OCT;  Surgeon: Martinique, Peter M, MD;  Location: [facility];  Service: Cardiovascular;  Laterality: N/A;  . LEFT HEART CATH AND CORONARY ANGIOGRAPHY N/A 09/17/2020   Procedure: LEFT HEART CATH AND CORONARY ANGIOGRAPHY;  Surgeon: Martinique, Peter M, MD;  Location: [facility];  Service: Cardiovascular;  Laterality: N/A;  . MASTECTOMY, RADICAL Bilateral 10 years ago  . ROTATOR CUFF REPAIR       OB History   No obstetric history on file.     Family History  Problem Relation Age of Onset  . Breast cancer Sister   . Ulcerative colitis Sister   . Colon cancer Neg Hx   . Colon polyps Neg Hx   . Esophageal  cancer Neg Hx   . Rectal cancer Neg Hx   . Stomach cancer Neg Hx     Social History   Tobacco Use  . Smoking status: Never Smoker  . Smokeless tobacco: Never Used  Vaping Use  . Vaping Use: Never used  Substance Use Topics  . Alcohol use: No  . Drug use: No    Home Medications Prior to Admission medications   Medication Sig Start Date End Date Taking? Authorizing Provider  colchicine 0.6 MG tablet Take 1 tablet (0.6 mg total) by mouth 2 (two) times daily for 14 days. 01/02/21 01/16/21 Yes Sherwood Gambler, MD  Alirocumab (PRALUENT) 150 MG/ML SOAJ Inject 150 mg into the skin  every 14 (fourteen) days. 12/23/20   Lorretta Harp, MD  amLODipine (NORVASC) 10 MG tablet Take 10 mg by mouth daily. 05/26/19   [provider]  aspirin EC 81 MG tablet Take 81 mg by mouth daily.    [provider]  buPROPion (WELLBUTRIN XL) 300 MG 24 hr tablet Take 300 mg by mouth daily.    [provider]  Cholecalciferol (VITAMIN D) 50 MCG (2000 UT) tablet Take 2,000 Units by mouth daily.    [provider]  clopidogrel (PLAVIX) 75 MG tablet Take 8 tablets (600 mg) for first dose, then take 1 tablet once daily. 12/17/20   Lorretta Harp, MD  ezetimibe (ZETIA) 10 MG tablet Take 1 tablet (10 mg total) by mouth daily. 12/01/20 03/01/21  Lorretta Harp, MD  glipiZIDE (GLUCOTROL XL) 2.5 MG 24 hr tablet Take 2.5 mg by mouth daily with breakfast.    [provider]  levothyroxine (SYNTHROID, LEVOTHROID) 50 MCG tablet Take 50 mcg by mouth daily before breakfast.    [provider]  losartan (COZAAR) 50 MG tablet Take 50 mg by mouth daily.    [provider]  Melatonin 10 MG TABS Take 10 mg by mouth at bedtime as needed (sleep).    [provider]  metoprolol tartrate (LOPRESSOR) 100 MG tablet Take 100 mg by mouth 2 (two) times daily.    [provider]  nitroGLYCERIN (NITROSTAT) 0.4 MG SL tablet Place 1 tablet (0.4 mg total) under the tongue every 5 (five) minutes as needed for chest pain. 09/21/20 09/21/21  Pokhrel, Corrie Mckusick, MD  pravastatin (PRAVACHOL) 40 MG tablet Take 1 tablet (40 mg total) by mouth every evening. 12/01/20 03/01/21  Lorretta Harp, MD    Allergies    Influenza virus vaccine, Lisinopril, and Rosuvastatin  Review of Systems   Review of Systems  Constitutional: Negative for fever.  Respiratory: Positive for cough and shortness of breath.   Cardiovascular: Positive for chest pain. Negative for leg swelling.  All other systems reviewed and are negative.   Physical Exam Updated Vital Signs BP 115/61  (BP Location: Right Arm)   Pulse 91   Temp 98.4 F (36.9 C) (Oral)   Resp 17   Ht 4\' 11"  (1.499 m)   Wt 74.8 kg   SpO2 100%   BMI 33.33 kg/m   Physical Exam Vitals and nursing note reviewed.  Constitutional:      Appearance: Jamie Benjamin is well-developed.  HENT:     Head: Normocephalic and atraumatic.     Right Ear: External ear normal.     Left Ear: External ear normal.     Nose: Nose normal.  Eyes:     General:        Right eye: No discharge.  Left eye: No discharge.  Cardiovascular:     Rate and Rhythm: Normal rate and regular rhythm.  Pulmonary:     Effort: Pulmonary effort is normal.  Abdominal:     Palpations: Abdomen is soft.     Tenderness: There is no abdominal tenderness.  Musculoskeletal:     Right lower leg: No edema.     Left lower leg: No edema.  Skin:    General: Skin is warm and dry.  Neurological:     Mental Status: Jamie Benjamin is alert.  Psychiatric:        Mood and Affect: Mood is not anxious.     ED Results / Procedures / Treatments   Labs (all labs ordered are listed, but only abnormal results are displayed) Labs Reviewed  BASIC METABOLIC PANEL - Abnormal; Notable for the following components:      Result Value   Glucose, Bld 146 (*)    BUN 28 (*)    Creatinine, Ser 1.35 (*)    GFR, Estimated 41 (*)    All other components within normal limits  CBC WITH DIFFERENTIAL/PLATELET - Abnormal; Notable for the following components:   WBC 12.7 (*)    Platelets 571 (*)    Neutro Abs 9.9 (*)    Monocytes Absolute 1.1 (*)    All other components within normal limits  RESP PANEL BY RT-PCR (FLU A&B, COVID) ARPGX2  BRAIN NATRIURETIC PEPTIDE  TROPONIN I (HIGH SENSITIVITY)    EKG EKG Interpretation  Date/Time:  Saturday January 02 2021 22:14:03 EDT Ventricular Rate:  92 PR Interval:  166 QRS Duration: 81 QT Interval:  443 QTC Calculation: 549 R Axis:   28 Text Interpretation: Sinus rhythm Probable left atrial enlargement Borderline T abnormalities,  anterior leads Prolonged QT interval Confirmed by Sherwood Gambler [phone]) on 01/02/2021 10:17:45 PM   Radiology DG Chest Portable 1 View  Result Date: 01/02/2021 CLINICAL DATA:  Chest pain, shortness of breath. EXAM: PORTABLE CHEST 1 VIEW COMPARISON:  Chest x-rays dated 12/31/2020 and 09/16/2020. FINDINGS: Small LEFT pleural effusion. Probable associated atelectasis. RIGHT lung remains clear. Heart size and mediastinal contours are stable. No pneumothorax. Osseous structures about the chest are unremarkable. IMPRESSION: 1. Small LEFT pleural effusion with probable associated atelectasis, stable to slightly decreased compared to earlier chest x-ray of 12/31/2020, but new compared to earlier chest x-ray of 09/16/2020. 2. No new lung findings.  RIGHT lung remains clear. Electronically Signed   By: Franki Cabot M.D.   On: 01/02/2021 22:00    Procedures Ultrasound ED Echo  Date/Time: 01/02/2021 11:19 PM Performed by: Sherwood Gambler, MD Authorized by: Sherwood Gambler, MD   Procedure details:    Indications: chest pain     Views: subxiphoid     Images: archived   Findings:    Pericardium: small pericardial effusion   Impression:    Impression: pericardial effusion present       Medications Ordered in ED Medications  colchicine tablet 0.6 mg (0.6 mg Oral Given 01/02/21 2238)    ED Course  I have reviewed the triage vital signs and the nursing notes.  Pertinent labs & imaging results that were available during my care of the patient were reviewed by me and considered in my medical decision making (see chart for details).    MDM Rules/Calculators/A&P                          Patient is well-appearing.  Chest x-ray reviewed and shows  small left pleural effusion though improved from a couple days ago.  Probably reactive.  Given this improving I do not think emergent work-up such as CT is needed.  Do not think Jamie Benjamin needs a thoracentesis.  Her bedside ultrasound does show a small pericardial  effusion but it is small and there is no evidence of tamponade.  Good cardiac activity/function.  Otherwise, her ECG shows some nonspecific T waves but her troponin is negative after multiple days of symptoms.  We will send COVID testing but most likely this is something like pericarditis.  I discussed with cardiology on-call, Dr. Emilio Aspen and we reviewed her chart/discussed patient.  He recommends starting 0.6 mg twice daily colchicine and Jamie Benjamin already has follow-up with her cardiologist, Dr. Gwenlyn Found in 3 days.  We discussed return precautions but Jamie Benjamin appears stable for discharge home. Final Clinical Impression(s) / ED Diagnoses Final diagnoses:  Acute pericarditis, unspecified type  Pleural effusion on left    Rx / DC Orders ED Discharge Orders         Ordered    colchicine 0.6 MG tablet  2 times daily        01/02/21 2230           Sherwood Gambler, MD 01/02/21 2321

## 2021-01-02 NOTE — ED Triage Notes (Signed)
Pt reports chest pain that starts in the substernal area, moves to the shoulders, neck and upper back.  Pt states chest pain is worse today.  Feels better when she leans forward.  Pn is intermittent and is between a 2 and 4.  Hx of MI and 2 stents in Feb.  Sent by Cards provider.    96%, placed on O2 for comfort HR 80 CBG 143  20G L AC

## 2021-01-02 NOTE — Discharge Instructions (Signed)
If you develop recurrent, continued, or worsening chest pain, shortness of breath, fever, vomiting, abdominal or back pain, or any other new/concerning symptoms then return to the ER for evaluation.  

## 2021-01-02 NOTE — Telephone Encounter (Signed)
Cardiology Moonlighter Note  Returned page from patient. Patient had MI in February. Was doing well until about 10 days ago when she developed recurrent chest pain. This pain is very different than the pain she had back in February. Pain is across the entire chest. Only on exertion. Improves with rest. Feels fullness in center of chest. Having some shortness of breath as well. Had chest xray 2 days ago that showed new L pleural effusion and L lower lobe atelectasis. No leg swelling. Can no longer lay flat, sitting up to sleep now. Never had orthopnea before. Has been taking all meds as prescribed. Has outpatient appt w Dr Gwenlyn Found on Tuesday. Wants to know if she should get checked out sooner.   Concerning constellation of symptoms in patient with fairly recent MI and no known history of clinical heart failure. Recommended she come to the ED for evaluation. Could be new HF, pericarditis, MI, pneumonia. Patient agrees and will come in tonight.   Marcie Mowers, MD Cardiology Fellow, PGY-8

## 2021-01-02 NOTE — ED Provider Notes (Signed)
Emergency Medicine Provider Triage Evaluation Note  Jamie Benjamin , a 75 y.o. female  was evaluated in triage.  Pt complains of shortness of breath and chest pain x 10 days. Symptoms were worse today. She called and spoke with on call cardiologist and they were concerned for possible pericarditis so advised her to call 911. Pain is better when leaning forward. She had outpatient chest xray Thursday showing left pleural effusion with enlarged heart and atelectasis. Had heart attack and 2 stents placedin February 2022. On plavix.  Review of Systems  Positive: Chest pain, shortness of breath Negative: Fever, leg swelling  Physical Exam  BP (!) 149/70 (BP Location: Right Arm)   Pulse 77   Temp 98.3 F (36.8 C) (Oral)   Resp 18   SpO2 98%  Gen:   Awake, no distress   Resp:  Normal effort  MSK:   Moves extremities without difficulty  Other:  No leg swelling  Medical Decision Making  Medically screening exam initiated at 9:07 PM.  Appropriate orders placed.  Maureen Chatters was informed that the remainder of the evaluation will be completed by another provider, this initial triage assessment does not replace that evaluation, and the importance of remaining in the ED until their evaluation is complete.  EKG, basic labs, troponin, and BNP ordered. Chest xray ordered.   Portions of this note were generated with Lobbyist. Dictation errors may occur despite best attempts at proofreading.    Barrie Folk, PA-C 01/02/21 2115    Sherwood Gambler, MD 01/05/21 1530

## 2021-01-05 ENCOUNTER — Encounter: Payer: Self-pay | Admitting: Cardiovascular Disease

## 2021-01-05 ENCOUNTER — Other Ambulatory Visit: Payer: Self-pay

## 2021-01-05 ENCOUNTER — Ambulatory Visit (INDEPENDENT_AMBULATORY_CARE_PROVIDER_SITE_OTHER): Payer: Medicare Other | Admitting: Cardiovascular Disease

## 2021-01-05 VITALS — BP 116/58 | HR 79 | Ht <= 58 in | Wt 161.8 lb

## 2021-01-05 DIAGNOSIS — E785 Hyperlipidemia, unspecified: Secondary | ICD-10-CM | POA: Diagnosis not present

## 2021-01-05 DIAGNOSIS — R0600 Dyspnea, unspecified: Secondary | ICD-10-CM | POA: Diagnosis not present

## 2021-01-05 DIAGNOSIS — I2510 Atherosclerotic heart disease of native coronary artery without angina pectoris: Secondary | ICD-10-CM

## 2021-01-05 DIAGNOSIS — I10 Essential (primary) hypertension: Secondary | ICD-10-CM

## 2021-01-05 DIAGNOSIS — Z9861 Coronary angioplasty status: Secondary | ICD-10-CM

## 2021-01-05 DIAGNOSIS — R0609 Other forms of dyspnea: Secondary | ICD-10-CM

## 2021-01-05 MED ORDER — COLCHICINE 0.6 MG PO TABS
0.6000 mg | ORAL_TABLET | Freq: Two times a day (BID) | ORAL | 1 refills | Status: DC
Start: 2021-01-05 — End: 2022-05-04

## 2021-01-05 NOTE — Assessment & Plan Note (Signed)
History of CAD status post admission for non-STEMI 09/16/2020 with troponins that were fairly low at 95 but no acute EKG changes.  She did undergo diagnostic coronary angiography by Dr. Peter Martinique on 2/70/22 revealing 99% mid LAD lesion.  He performed PCI and drug-eluting stenting with a 3 mm x 18 mm long resolute Onyx drug-eluting stent.  There was a distal edge dissection requiring an additional overlapping stent (3 mm x 8 mm) with an excellent result.  She had well-preserved LV function with mild apical hypokinesia.  She was on Brilinta initially and transition to Plavix because of shortness of breath.  She was recently seen in the ER for positional chest pain and shortness of breath which sound more pericarditic.  Her enzymes were negative and her EKG showed no acute changes.  She was put on colchicine and has had no further chest pain.

## 2021-01-05 NOTE — Assessment & Plan Note (Signed)
History of hyperlipidemia on Praluent with lipid profile performed 11/30/2020 revealing total cholesterol 139, LDL 68 and HDL 44.

## 2021-01-05 NOTE — Patient Instructions (Signed)
Medication Instructions:  Your physician recommends that you continue on your current medications as directed. Please refer to the Current Medication list given to you today.  *If you need a refill on your cardiac medications before your next appointment, please call your pharmacy*   Testing/Procedures: Your physician has requested that you have an echocardiogram. Echocardiography is a painless test that uses sound waves to create images of your heart. It provides your doctor with information about the size and shape of your heart and how well your heart's chambers and valves are working. This procedure takes approximately one hour. There are no restrictions for this procedure. This procedure is done at [address] 3rd Floor.   Follow-Up: At [facility], you and your health needs are our priority.  As part of our continuing mission to provide you with exceptional heart care, we have created designated Provider Care Teams.  These Care Teams include your primary Cardiologist (physician) and Advanced Practice Providers (APPs -  Physician Assistants and Nurse Practitioners) who all work together to provide you with the care you need, when you need it.  We recommend signing up for the patient portal called "MyChart".  Sign up information is provided on this After Visit Summary.  MyChart is used to connect with patients for Virtual Visits (Telemedicine).  Patients are able to view lab/test results, encounter notes, upcoming appointments, etc.  Non-urgent messages can be sent to your provider as well.   To learn more about what you can do with MyChart, go to [email or web address].    Your next appointment:   3 month(s)  The format for your next appointment:   In Person  Provider:   Quay Burow, MD

## 2021-01-05 NOTE — Progress Notes (Signed)
01/05/2021 Jamie Benjamin   11/16/1945  [id number]  Primary Physician Maurice Small, MD Primary Cardiologist: Lorretta Harp MD Lupe Carney, [place]  HPI:  Jamie Benjamin is a 75 y.o. moderately overweight widowed Caucasian female mother of 2, grandmother of 5 grandchildren who is accompanied by her son [name] who is an ER physician at [employer].  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  She basically had no cardiac risk factors other now she she is treated for essential hypertension hyperlipidemia.  She had a non-STEMI 09/16/2020 with troponins went up to 95 and diagnostic coronary artery angiography, PCI and drug-eluting stenting of the mid LAD by Dr. Martinique the following day.  She had no other CAD and preserved LV function.  She was on Brilinta initially which was transitioned to Plavix because of side effects.  She was recently seen in the ER several days ago for positional positional chest pain and shortness of breath.  She did have a small pericardial effusion and a left pleural effusion which improved over several days.  Symptoms were somewhat positional consistent with pleuropericarditis.  She did have exposure to a COVID-patient but tested negative.  It still not clear whether she did have COVID since she did have exposure to this.  Dr. Neena Rhymes was consulted and recommended colchicine which she has been on and since that time has had no recurrent chest pain.   Current Meds  Medication Sig  . Alirocumab (PRALUENT) 150 MG/ML SOAJ Inject 150 mg into the skin every 14 (fourteen) days.  Marland Kitchen amLODipine (NORVASC) 10 MG tablet Take 10 mg by mouth daily.  Marland Kitchen aspirin EC 81 MG tablet Take 81 mg by mouth daily.  Marland Kitchen buPROPion (WELLBUTRIN XL) 300 MG 24 hr tablet Take 300 mg by mouth daily.  . Cholecalciferol (VITAMIN D) 50 MCG (2000 UT) tablet Take 2,000 Units by mouth daily.  . clopidogrel (PLAVIX) 75 MG tablet Take 8 tablets (600 mg) for  first dose, then take 1 tablet once daily.  . colchicine 0.6 MG tablet Take 1 tablet (0.6 mg total) by mouth 2 (two) times daily for 14 days.  Marland Kitchen glipiZIDE (GLUCOTROL XL) 2.5 MG 24 hr tablet Take 2.5 mg by mouth daily with breakfast.  . levothyroxine (SYNTHROID, LEVOTHROID) 50 MCG tablet Take 50 mcg by mouth daily before breakfast.  . losartan (COZAAR) 50 MG tablet Take 50 mg by mouth daily.  . Melatonin 10 MG TABS Take 10 mg by mouth at bedtime as needed (sleep).  . metoprolol tartrate (LOPRESSOR) 100 MG tablet Take 100 mg by mouth 2 (two) times daily.  . nitroGLYCERIN (NITROSTAT) 0.4 MG SL tablet Place 1 tablet (0.4 mg total) under the tongue every 5 (five) minutes as needed for chest pain.  . [DISCONTINUED] ezetimibe (ZETIA) 10 MG tablet Take 1 tablet (10 mg total) by mouth daily.  . [DISCONTINUED] pravastatin (PRAVACHOL) 40 MG tablet Take 1 tablet (40 mg total) by mouth every evening.     Allergies  Allergen Reactions  . Influenza Virus Vaccine Other (See Comments)  . Lisinopril Cough  . Rosuvastatin     myalgia    Social History   Socioeconomic History  . Marital status: Widowed    Spouse name: Not on file  . Number of children: Not on file  . Years of education: Not on file  . Highest education level: Not on file  Occupational History  . Occupation:  Retired Therapist, sports  Tobacco Use  . Smoking status: Never Smoker  . Smokeless tobacco: Never Used  Vaping Use  . Vaping Use: Never used  Substance and Sexual Activity  . Alcohol use: No  . Drug use: No  . Sexual activity: Not on file  Other Topics Concern  . Not on file  Social History Narrative  . Not on file   Social Determinants of Health   Financial Resource Strain: Not on file  Food Insecurity: Not on file  Transportation Needs: Not on file  Physical Activity: Not on file  Stress: Not on file  Social Connections: Not on file  Intimate Partner Violence: Not on file     Review of Systems: General: negative for  chills, fever, night sweats or weight changes.  Cardiovascular: negative for chest pain, dyspnea on exertion, edema, orthopnea, palpitations, paroxysmal nocturnal dyspnea or shortness of breath Dermatological: negative for rash Respiratory: negative for cough or wheezing Urologic: negative for hematuria Abdominal: negative for nausea, vomiting, diarrhea, bright red blood per rectum, melena, or hematemesis Neurologic: negative for visual changes, syncope, or dizziness All other systems reviewed and are otherwise negative except as noted above.    Blood pressure (!) 116/58, pulse 79, height 4\' 10"  (1.473 m), weight 161 lb 12.8 oz (73.4 kg), SpO2 95 %.  General appearance: alert and no distress Neck: no adenopathy, no carotid bruit, no JVD, supple, symmetrical, trachea midline and thyroid not enlarged, symmetric, no tenderness/mass/nodules Lungs: clear to auscultation bilaterally Heart: regular rate and rhythm, S1, S2 normal, no murmur, click, rub or gallop Extremities: extremities normal, atraumatic, no cyanosis or edema Pulses: 2+ and symmetric Skin: Skin color, texture, turgor normal. No rashes or lesions Neurologic: Alert and oriented X 3, normal strength and tone. Normal symmetric reflexes. Normal coordination and gait  EKG not performed today  ASSESSMENT AND PLAN:   Dyslipidemia, goal LDL below 70 History of hyperlipidemia on Praluent with lipid profile performed 11/30/2020 revealing total cholesterol 139, LDL 68 and HDL 44.  Hypertension History of essential hypertension a blood pressure measured today 116/58.  She is on amlodipine, losartan and metoprolol.  CAD S/P percutaneous coronary angioplasty History of CAD status post admission for non-STEMI 09/16/2020 with troponins that were fairly low at 95 but no acute EKG changes.  She did undergo diagnostic coronary angiography by Dr. Peter Martinique on 2/70/22 revealing 99% mid LAD lesion.  He performed PCI and drug-eluting stenting with  a 3 mm x 18 mm long resolute Onyx drug-eluting stent.  There was a distal edge dissection requiring an additional overlapping stent (3 mm x 8 mm) with an excellent result.  She had well-preserved LV function with mild apical hypokinesia.  She was on Brilinta initially and transition to Plavix because of shortness of breath.  She was recently seen in the ER for positional chest pain and shortness of breath which sound more pericarditic.  Her enzymes were negative and her EKG showed no acute changes.  She was put on colchicine and has had no further chest pain.      Lorretta Harp MD FACP,FACC,FAHA, [facility] 01/05/2021 10:12 AM

## 2021-01-05 NOTE — Assessment & Plan Note (Signed)
History of essential hypertension a blood pressure measured today 116/58.  She is on amlodipine, losartan and metoprolol.

## 2021-01-11 ENCOUNTER — Other Ambulatory Visit (HOSPITAL_COMMUNITY)
Admission: RE | Admit: 2021-01-11 | Discharge: 2021-01-11 | Disposition: A | Discharge status: Home / Self Care | Payer: Medicare Other | Source: Ambulatory Visit | Attending: Family Medicine | Admitting: Family Medicine

## 2021-01-11 DIAGNOSIS — Z20822 Contact with and (suspected) exposure to covid-19: Secondary | ICD-10-CM | POA: Diagnosis not present

## 2021-01-11 DIAGNOSIS — Z01812 Encounter for preprocedural laboratory examination: Secondary | ICD-10-CM | POA: Insufficient documentation

## 2021-01-11 LAB — SARS CORONAVIRUS 2 (TAT 6-24 HRS): SARS Coronavirus 2: NEGATIVE

## 2021-01-13 ENCOUNTER — Other Ambulatory Visit: Payer: Self-pay | Admitting: *Deleted

## 2021-01-13 DIAGNOSIS — R0600 Dyspnea, unspecified: Secondary | ICD-10-CM

## 2021-01-13 DIAGNOSIS — R0609 Other forms of dyspnea: Secondary | ICD-10-CM

## 2021-01-14 ENCOUNTER — Ambulatory Visit (INDEPENDENT_AMBULATORY_CARE_PROVIDER_SITE_OTHER): Payer: Medicare Other | Admitting: Internal Medicine

## 2021-01-14 ENCOUNTER — Other Ambulatory Visit: Payer: Self-pay

## 2021-01-14 DIAGNOSIS — R0600 Dyspnea, unspecified: Secondary | ICD-10-CM | POA: Diagnosis not present

## 2021-01-14 DIAGNOSIS — R0609 Other forms of dyspnea: Secondary | ICD-10-CM

## 2021-01-14 LAB — PULMONARY FUNCTION TEST
DL/VA % pred: 148 %
DL/VA: 6.37 ml/min/mmHg/L
DLCO cor % pred: 99 %
DLCO cor: 15.36 ml/min/mmHg
DLCO unc % pred: 96 %
DLCO unc: 14.92 ml/min/mmHg
FEF 25-75 Pre: 0.86 L/sec
FEF2575-%Pred-Pre: 64 %
FEV1-%Pred-Pre: 55 %
FEV1-Pre: 0.87 L
FEV1FVC-%Pred-Pre: 108 %
FEV6-%Pred-Pre: 53 %
FEV6-Pre: 1.07 L
FEV6FVC-%Pred-Pre: 105 %
FVC-%Pred-Pre: 50 %
FVC-Pre: 1.07 L
Pre FEV1/FVC ratio: 81 %
Pre FEV6/FVC Ratio: 100 %
RV % pred: 64 %
RV: 1.28 L
TLC % pred: 58 %
TLC: 2.44 L

## 2021-01-14 NOTE — Patient Instructions (Signed)
Spirometry/DLCO and lung volumes performed today. 

## 2021-01-14 NOTE — Progress Notes (Signed)
Spirometry,DLCO and Lung volumes performed today. Patient declined Albuterol and or Levalbuterol therefore no post spirometry.Jamie Benjamin

## 2021-01-27 ENCOUNTER — Telehealth: Payer: Self-pay | Admitting: Internal Medicine

## 2021-01-27 NOTE — Telephone Encounter (Signed)
Tried calling office to see what notes they wanted to be interpreted. I think it is for the PFT that was performed on 6/16. It has not been interpreted. Office is closed. Leaving to call back on tomorrow.

## 2021-01-28 ENCOUNTER — Other Ambulatory Visit: Payer: Self-pay

## 2021-01-28 ENCOUNTER — Ambulatory Visit (HOSPITAL_COMMUNITY): Payer: Medicare Other | Attending: Internal Medicine

## 2021-01-28 DIAGNOSIS — E785 Hyperlipidemia, unspecified: Secondary | ICD-10-CM | POA: Diagnosis not present

## 2021-01-28 DIAGNOSIS — I10 Essential (primary) hypertension: Secondary | ICD-10-CM | POA: Insufficient documentation

## 2021-01-28 DIAGNOSIS — Z9861 Coronary angioplasty status: Secondary | ICD-10-CM | POA: Diagnosis not present

## 2021-01-28 DIAGNOSIS — I2510 Atherosclerotic heart disease of native coronary artery without angina pectoris: Secondary | ICD-10-CM | POA: Diagnosis not present

## 2021-01-28 DIAGNOSIS — R0600 Dyspnea, unspecified: Secondary | ICD-10-CM | POA: Diagnosis not present

## 2021-01-28 DIAGNOSIS — R0609 Other forms of dyspnea: Secondary | ICD-10-CM

## 2021-01-28 LAB — ECHOCARDIOGRAM COMPLETE
Area-P 1/2: 3.19 cm2
S' Lateral: 2.8 cm

## 2021-01-28 NOTE — Telephone Encounter (Signed)
Called and spoke with [facility]. She stated that they were requesting the interpretation from the PFT that done last week. Confirmed fax number of [phone]. Will fax PFT.   Nothing further needed at time of call.

## 2021-02-02 ENCOUNTER — Encounter: Payer: Self-pay | Admitting: Pulmonary Disease

## 2021-02-22 DIAGNOSIS — N1832 Chronic kidney disease, stage 3b: Secondary | ICD-10-CM | POA: Diagnosis not present

## 2021-02-23 ENCOUNTER — Other Ambulatory Visit: Payer: Self-pay

## 2021-02-23 DIAGNOSIS — E785 Hyperlipidemia, unspecified: Secondary | ICD-10-CM

## 2021-02-23 LAB — LIPID PANEL
Chol/HDL Ratio: 3 ratio (ref 0.0–4.4)
Cholesterol, Total: 145 mg/dL (ref 100–199)
HDL: 49 mg/dL (ref >39)
LDL Chol Calc (NIH): 65 mg/dL (ref 0–99)
Triglycerides: 189 mg/dL — ABNORMAL HIGH (ref 0–149)
VLDL Cholesterol Cal: 31 mg/dL (ref 5–40)

## 2021-02-23 LAB — HEPATIC FUNCTION PANEL
ALT: 29 IU/L (ref 0–32)
AST: 19 IU/L (ref 0–40)
Albumin: 4.4 g/dL (ref 3.7–4.7)
Alkaline Phosphatase: 119 IU/L (ref 44–121)
Bilirubin Total: 0.4 mg/dL (ref 0.0–1.2)
Bilirubin, Direct: 0.11 mg/dL (ref 0.00–0.40)
Total Protein: 7 g/dL (ref 6.0–8.5)

## 2021-02-23 MED ORDER — PRAVASTATIN SODIUM 20 MG PO TABS: 20.0000 mg | ORAL_TABLET | Freq: Every evening | ORAL | 3 refills | Status: DC

## 2021-02-23 NOTE — Addendum Note (Signed)
Addended by: Allean Found on: 02/23/2021 05:43 PM   Modules accepted: Orders

## 2021-03-01 DIAGNOSIS — N1832 Chronic kidney disease, stage 3b: Secondary | ICD-10-CM | POA: Diagnosis not present

## 2021-03-01 DIAGNOSIS — N281 Cyst of kidney, acquired: Secondary | ICD-10-CM | POA: Diagnosis not present

## 2021-03-01 DIAGNOSIS — N2581 Secondary hyperparathyroidism of renal origin: Secondary | ICD-10-CM | POA: Diagnosis not present

## 2021-03-01 DIAGNOSIS — N179 Acute kidney failure, unspecified: Secondary | ICD-10-CM | POA: Diagnosis not present

## 2021-03-01 DIAGNOSIS — D631 Anemia in chronic kidney disease: Secondary | ICD-10-CM | POA: Diagnosis not present

## 2021-03-01 DIAGNOSIS — I129 Hypertensive chronic kidney disease with stage 1 through stage 4 chronic kidney disease, or unspecified chronic kidney disease: Secondary | ICD-10-CM | POA: Diagnosis not present

## 2021-03-09 ENCOUNTER — Other Ambulatory Visit: Payer: Self-pay

## 2021-03-09 ENCOUNTER — Ambulatory Visit (INDEPENDENT_AMBULATORY_CARE_PROVIDER_SITE_OTHER): Payer: Medicare Other | Admitting: Pulmonary Disease

## 2021-03-09 ENCOUNTER — Encounter: Payer: Self-pay | Admitting: Pulmonary Disease

## 2021-03-09 VITALS — BP 118/62 | HR 68 | Temp 97.3°F | Ht 59.0 in | Wt 158.6 lb

## 2021-03-09 DIAGNOSIS — J90 Pleural effusion, not elsewhere classified: Secondary | ICD-10-CM | POA: Diagnosis not present

## 2021-03-09 DIAGNOSIS — R0600 Dyspnea, unspecified: Secondary | ICD-10-CM | POA: Diagnosis not present

## 2021-03-09 DIAGNOSIS — R0609 Other forms of dyspnea: Secondary | ICD-10-CM

## 2021-03-09 NOTE — Progress Notes (Signed)
$'@Patient'Y$  ID: Jamie Benjamin, female    DOB: Aug 13, 1945, 75 y.o.   MRN: [id number]  Chief Complaint  Patient presents with   Consult    Doing better    Referring provider: Maurice Small, MD  HPI:   75 y.o. Woman whom we are seeing in consultation for evaluation of dyspnea on exertion.  Recent cardiology note 12/2020 reviewed.  Patient was admitted to hospital 09/2020 with MI.  Left heart cath with 99% mid LAD lesion with PCI in 6-day interval improvement in flow.  Her ejection fraction was borderline normal.  TTE largely reassuring.  He has had ongoing dyspnea on exertion since MI.  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  Chest x-ray 6/2 and 6/4 of 2022 reviewed and interpreted as clear lungs with mild to moderate size left pleural effusion on my interpretation.  When compared to CT chest 09/2020 pleural effusion is new, parenchyma largely clear on that CT chest on my interpretation.  Her last few weeks, her respiration.  Improved.  No dyspnea.  No shortness of breath.  She is got markedly better.  Nothing she can clearly attribute this to.  She is usually seen by her PCP and they did not hear evidence of pleural effusion.  She has no real complaints from respiratory status today.  She kept appointment as it was already scheduled to make sure her lungs are doing okay.  PMH: CAD, hyperlipidemia, hypertension Surgical history: CABG, mastectomy, rotator cuff repair Family history: Sister with breast cancer, ulcerative colitis Social history: Never smoker, lives in [place] / Pulmonary Flowsheets:   ACT:  No flowsheet data found.  MMRC: No flowsheet data found.  Epworth:  No flowsheet data found.  Tests:   FENO:  No results found for: NITRICOXIDE  PFT: PFT Results Latest Ref Rng & Units 01/14/2021  FVC-Pre L 1.07  FVC-Predicted Pre % 50  Pre FEV1/FVC % % 81  FEV1-Pre L 0.87  FEV1-Predicted  Pre % 55  DLCO uncorrected ml/min/mmHg 14.92  DLCO UNC% % 96  DLCO corrected ml/min/mmHg 15.36  DLCO COR %Predicted % 99  DLVA Predicted % 148  TLC L 2.44  TLC % Predicted % 58  RV % Predicted % 64  Personally reviewed and interpreted-spirometry consistent with mild to moderate restriction versus gas trapping, TLC reveals moderate restriction.  DLCO within normal limits.  This suggestive of extraparenchymal restriction.  WALK:  No flowsheet data found.  Imaging: Personally reviewed and as per EMR  Lab Results: Personally reviewed CBC    Component Value Date/Time   WBC 12.7 (H) 01/02/2021 2111   RBC 4.16 01/02/2021 2111   HGB 12.5 01/02/2021 2111   HCT 38.8 01/02/2021 2111   PLT 571 (H) 01/02/2021 2111   MCV 93.3 01/02/2021 2111   MCH 30.0 01/02/2021 2111   MCHC 32.2 01/02/2021 2111   RDW 12.5 01/02/2021 2111   LYMPHSABS 1.2 01/02/2021 2111   MONOABS 1.1 (H) 01/02/2021 2111   EOSABS 0.3 01/02/2021 2111   BASOSABS 0.1 01/02/2021 2111    BMET    Component Value Date/Time   NA 138 01/02/2021 2111   NA 141 11/25/2020 1111   K 3.5 01/02/2021 2111   CL 105 01/02/2021 2111   CO2 25 01/02/2021 2111   GLUCOSE 146 (H) 01/02/2021 2111   BUN 28 (H) 01/02/2021 2111   BUN 35 (H) 11/25/2020 1111   CREATININE 1.35 (H) 01/02/2021 2111   CALCIUM  9.5 01/02/2021 2111   GFRNONAA 41 (L) 01/02/2021 2111    BNP    Component Value Date/Time   BNP 94.3 01/02/2021 2111    ProBNP No results found for: PROBNP  Specialty Problems   None   Allergies  Allergen Reactions   Influenza Virus Vaccine Other (See Comments)   Lisinopril Cough   Rosuvastatin     myalgia    Immunization History  Administered Date(s) Administered   Pneumococcal Conjugate-13 08/19/2014   Pneumococcal Polysaccharide-23 08/25/2015    Past Medical History:  Diagnosis Date   Allergy    seasonal   Arthritis    Breast cancer ([place]) 2005   left   Depression    Diverticulitis    Hyperlipidemia     Hypertension    Hypothyroidism (acquired)    IBS (irritable bowel syndrome)    Obesity    Prediabetes    Stage 3a chronic kidney disease ([place]) 09/16/2020    Tobacco History: Social History   Tobacco Use  Smoking Status Never  Smokeless Tobacco Never   Counseling given: Yes   Continue to not smoke  Outpatient Encounter Medications as of 03/09/2021  Medication Sig   albuterol (PROVENTIL) (2.5 MG/3ML) 0.083% nebulizer solution SMARTSIG:3 Milliliter(s) Via Nebulizer Every 6 Hours PRN   albuterol (VENTOLIN HFA) 108 (90 Base) MCG/ACT inhaler SMARTSIG:1 Puff(s) Via Inhaler Every 4 Hours PRN   Alirocumab (PRALUENT) 150 MG/ML SOAJ Inject 150 mg into the skin every 14 (fourteen) days.   amLODipine (NORVASC) 10 MG tablet Take 10 mg by mouth daily.   aspirin EC 81 MG tablet Take 81 mg by mouth daily.   buPROPion (WELLBUTRIN XL) 300 MG 24 hr tablet Take 300 mg by mouth daily.   Cholecalciferol (VITAMIN D) 50 MCG (2000 UT) tablet Take 2,000 Units by mouth daily.   clopidogrel (PLAVIX) 75 MG tablet Take 8 tablets (600 mg) for first dose, then take 1 tablet once daily.   colchicine 0.6 MG tablet Take 1 tablet (0.6 mg total) by mouth 2 (two) times daily.   glipiZIDE (GLUCOTROL XL) 2.5 MG 24 hr tablet Take 2.5 mg by mouth daily with breakfast.   levothyroxine (SYNTHROID, LEVOTHROID) 50 MCG tablet Take 50 mcg by mouth daily before breakfast.   Melatonin 10 MG TABS Take 10 mg by mouth at bedtime as needed (sleep).   metoprolol tartrate (LOPRESSOR) 100 MG tablet Take 100 mg by mouth 2 (two) times daily.   montelukast (SINGULAIR) 10 MG tablet Take 10 mg by mouth daily.   nitroGLYCERIN (NITROSTAT) 0.4 MG SL tablet Place 1 tablet (0.4 mg total) under the tongue every 5 (five) minutes as needed for chest pain.   pravastatin (PRAVACHOL) 20 MG tablet Take 1 tablet (20 mg total) by mouth every evening.   [DISCONTINUED] losartan (COZAAR) 50 MG tablet Take 50 mg by mouth daily.   No facility-administered  encounter medications on file as of 03/09/2021.     Review of Systems  Review of Systems  No chest pain with exertion.  No orthopnea or PND.  Comprehensive review of systems otherwise negative. Physical Exam  BP 118/62 (BP Location: Left Arm, Cuff Size: Normal)   Pulse 68   Temp (!) 97.3 F (36.3 C) (Oral)   Ht '4\' 11"'$  (1.499 m)   Wt 158 lb 9.6 oz (71.9 kg)   SpO2 98%   BMI 32.03 kg/m   Wt Readings from Last 5 Encounters:  03/09/21 158 lb 9.6 oz (71.9 kg)  01/05/21 161 lb 12.8 oz (  73.4 kg)  01/02/21 165 lb (74.8 kg)  11/25/20 166 lb 6.4 oz (75.5 kg)  10/16/20 161 lb 13.1 oz (73.4 kg)    BMI Readings from Last 5 Encounters:  03/09/21 32.03 kg/m  01/05/21 33.82 kg/m  01/02/21 33.33 kg/m  11/25/20 33.61 kg/m  10/16/20 33.82 kg/m     Physical Exam General: Well-appearing, no acute distress Eyes: EOMI, no icterus Neck: Supple, no JVP Cardiovascular: Regular rate and rhythm, no murmur Pulmonary: Clear to auscultation, normal work of breathing, no evidence of pleural effusion on exam Abdomen: Nondistended, bowel sounds present MSK: No synovitis, no joint effusion Neuro: Normal gait, no weakness Psych: Normal mood, full affect   Assessment & Plan:   Dyspnea on exertion: Resolved.  Likely related to pleural effusion that is resolved on exam.  PFTs show moderate restriction with normal DLCO suggestive of extraparenchymal cause.  Parenchyma clear on CT scan during recent hospitalization.  No further work-up at this time.  Pleural effusion: Suspect related to CABG and status post surgery, postsurgical inflammation.  Spontaneously resolved.  Not present on exam.  Consider repeat chest x-ray in the future if to evaluate for reaccumulation of rest or symptoms were to develop.   Return if symptoms worsen or fail to improve.   Lanier Clam, MD 03/09/2021

## 2021-03-09 NOTE — Patient Instructions (Signed)
Nice to meet you  I am glad your symptoms are much improved  I suspect these were related to the inflammation in your chest and the pleural effusion.  On exam, I hear no evidence of a pleural effusion.    No additional tests or medications today.  Follow-up with Dr. Silas Flood as needed

## 2021-03-15 DIAGNOSIS — N1832 Chronic kidney disease, stage 3b: Secondary | ICD-10-CM | POA: Diagnosis not present

## 2021-03-30 DIAGNOSIS — M810 Age-related osteoporosis without current pathological fracture: Secondary | ICD-10-CM | POA: Diagnosis not present

## 2021-03-30 DIAGNOSIS — J449 Chronic obstructive pulmonary disease, unspecified: Secondary | ICD-10-CM | POA: Diagnosis not present

## 2021-03-30 DIAGNOSIS — E1121 Type 2 diabetes mellitus with diabetic nephropathy: Secondary | ICD-10-CM | POA: Diagnosis not present

## 2021-03-30 DIAGNOSIS — E039 Hypothyroidism, unspecified: Secondary | ICD-10-CM | POA: Diagnosis not present

## 2021-03-30 DIAGNOSIS — I10 Essential (primary) hypertension: Secondary | ICD-10-CM | POA: Diagnosis not present

## 2021-03-30 DIAGNOSIS — N183 Chronic kidney disease, stage 3 unspecified: Secondary | ICD-10-CM | POA: Diagnosis not present

## 2021-03-30 DIAGNOSIS — D510 Vitamin B12 deficiency anemia due to intrinsic factor deficiency: Secondary | ICD-10-CM | POA: Diagnosis not present

## 2021-03-30 DIAGNOSIS — E785 Hyperlipidemia, unspecified: Secondary | ICD-10-CM | POA: Diagnosis not present

## 2021-03-31 DIAGNOSIS — I10 Essential (primary) hypertension: Secondary | ICD-10-CM | POA: Diagnosis not present

## 2021-03-31 DIAGNOSIS — M810 Age-related osteoporosis without current pathological fracture: Secondary | ICD-10-CM | POA: Diagnosis not present

## 2021-03-31 DIAGNOSIS — E119 Type 2 diabetes mellitus without complications: Secondary | ICD-10-CM | POA: Diagnosis not present

## 2021-03-31 DIAGNOSIS — E039 Hypothyroidism, unspecified: Secondary | ICD-10-CM | POA: Diagnosis not present

## 2021-03-31 DIAGNOSIS — E785 Hyperlipidemia, unspecified: Secondary | ICD-10-CM | POA: Diagnosis not present

## 2021-03-31 DIAGNOSIS — D510 Vitamin B12 deficiency anemia due to intrinsic factor deficiency: Secondary | ICD-10-CM | POA: Diagnosis not present

## 2021-04-01 DIAGNOSIS — E119 Type 2 diabetes mellitus without complications: Secondary | ICD-10-CM | POA: Diagnosis not present

## 2021-04-09 ENCOUNTER — Other Ambulatory Visit: Payer: Self-pay

## 2021-04-09 ENCOUNTER — Ambulatory Visit (INDEPENDENT_AMBULATORY_CARE_PROVIDER_SITE_OTHER): Payer: Medicare Other | Admitting: Cardiovascular Disease

## 2021-04-09 ENCOUNTER — Encounter: Payer: Self-pay | Admitting: Cardiovascular Disease

## 2021-04-09 DIAGNOSIS — Z9861 Coronary angioplasty status: Secondary | ICD-10-CM

## 2021-04-09 DIAGNOSIS — E785 Hyperlipidemia, unspecified: Secondary | ICD-10-CM | POA: Diagnosis not present

## 2021-04-09 DIAGNOSIS — I10 Essential (primary) hypertension: Secondary | ICD-10-CM | POA: Diagnosis not present

## 2021-04-09 DIAGNOSIS — I2510 Atherosclerotic heart disease of native coronary artery without angina pectoris: Secondary | ICD-10-CM

## 2021-04-09 NOTE — Assessment & Plan Note (Addendum)
History of hyperlipidemia on pravastatin and Praluent lipid profile recently performed 03/31/2021 by her PCP revealing total cholesterol 104, LDL 31 and HDL of 50.  Addendum: Lipid profile performed 09/16/2021 revealed total cholesterol 110, LDL of 32 and HDL 51.

## 2021-04-09 NOTE — Progress Notes (Addendum)
10/06/2021 Jamie Benjamin   24-Mar-1946  [id number]  Primary Physician Jamie Small, MD Primary Cardiologist: Jamie Harp MD Jamie Benjamin, [place]  HPI:  Jamie Benjamin is a 75 y.o. moderately overweight widowed Caucasian female mother of 2, grandmother of 5 grandchildren who is accompanied by her son Jamie Benjamin who is an ER physician at [employer].  I last saw her in the office 01/05/2021.  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  She basically had no cardiac risk factors other now she she is treated for essential hypertension hyperlipidemia.  She had a non-STEMI 09/16/2020 with troponins went up to 95 and diagnostic coronary artery angiography, PCI and drug-eluting stenting of the mid LAD by Dr. Martinique the following day.  She had no other CAD and preserved LV function.  She was on Brilinta initially which was transitioned to Plavix because of side effects.   She was seen in the ER several days ago for positional positional chest pain and shortness of breath.  She did have a Benjamin pericardial effusion and a left pleural effusion which improved over several days.  Symptoms were somewhat positional consistent with pleuropericarditis.  She did have exposure to a COVID-patient but tested negative.  It still not clear whether she did have COVID since she did have exposure to this.  Dr. Neena Benjamin was consulted and recommended colchicine which she has been on and since that time has had no recurrent chest pain.  I did tell her that she could discontinue colchicine after being on it for 3 months.  Since I saw her 3 months ago she continues to do well.  She denies chest pain or shortness of breath.  She has lost 20 pounds.    Current Meds  Medication Sig   amLODipine (NORVASC) 10 MG tablet Take 10 mg by mouth daily.   aspirin EC 81 MG tablet Take 81 mg by mouth daily.   buPROPion (WELLBUTRIN XL) 300 MG 24 hr tablet Take 300 mg by mouth daily.    Cholecalciferol (VITAMIN D) 50 MCG (2000 UT) tablet Take 2,000 Units by mouth daily.   colchicine 0.6 MG tablet Take 1 tablet (0.6 mg total) by mouth 2 (two) times daily.   glipiZIDE (GLUCOTROL XL) 2.5 MG 24 hr tablet Take 2.5 mg by mouth daily with breakfast.   levothyroxine (SYNTHROID, LEVOTHROID) 50 MCG tablet Take 50 mcg by mouth daily before breakfast.   metoprolol tartrate (LOPRESSOR) 100 MG tablet Take 100 mg by mouth 2 (two) times daily.   montelukast (SINGULAIR) 10 MG tablet Take 10 mg by mouth daily.   pravastatin (PRAVACHOL) 20 MG tablet Take 1 tablet (20 mg total) by mouth every evening.   [DISCONTINUED] Alirocumab (PRALUENT) 150 MG/ML SOAJ Inject 150 mg into the skin every 14 (fourteen) days.   [DISCONTINUED] clopidogrel (PLAVIX) 75 MG tablet Take 8 tablets (600 mg) for first dose, then take 1 tablet once daily.     Allergies  Allergen Reactions   Influenza Virus Vaccine Other (See Comments)   Lisinopril Cough   Rosuvastatin     myalgia    Social History   Socioeconomic History   Marital status: Widowed    Spouse name: Not on file   Number of children: Not on file   Years of education: Not on file   Highest education level: Not on file  Occupational History   Occupation: Retired Therapist, sports  Tobacco Use  Smoking status: Never   Smokeless tobacco: Never  Vaping Use   Vaping Use: Never used  Substance and Sexual Activity   Alcohol use: No   Drug use: No   Sexual activity: Not on file  Other Topics Concern   Not on file  Social History Narrative   Not on file   Social Determinants of Health   Financial Resource Strain: Not on file  Food Insecurity: Not on file  Transportation Needs: Not on file  Physical Activity: Not on file  Stress: Not on file  Social Connections: Not on file  Intimate Partner Violence: Not on file     Review of Systems: General: negative for chills, fever, night sweats or weight changes.  Cardiovascular: negative for chest pain, dyspnea  on exertion, edema, orthopnea, palpitations, paroxysmal nocturnal dyspnea or shortness of breath Dermatological: negative for rash Respiratory: negative for cough or wheezing Urologic: negative for hematuria Abdominal: negative for nausea, vomiting, diarrhea, bright red blood per rectum, melena, or hematemesis Neurologic: negative for visual changes, syncope, or dizziness All other systems reviewed and are otherwise negative except as noted above.    Blood pressure 120/70, pulse 70, height '4\' 11"'$  (1.499 m), weight 156 lb 6.4 oz (70.9 kg), SpO2 99 %.  General appearance: alert and no distress Neck: no adenopathy, no carotid bruit, no JVD, supple, symmetrical, trachea midline, and thyroid not enlarged, symmetric, no tenderness/mass/nodules Lungs: clear to auscultation bilaterally Heart: regular rate and rhythm, S1, S2 normal, no murmur, click, rub or gallop Extremities: extremities normal, atraumatic, no cyanosis or edema Pulses: 2+ and symmetric Skin: Skin color, texture, turgor normal. No rashes or lesions Neurologic: Grossly normal  EKG not performed today  ASSESSMENT AND PLAN:   Dyslipidemia, goal LDL below 70 History of hyperlipidemia on pravastatin and Praluent lipid profile recently performed 03/31/2021 by her PCP revealing total cholesterol 104, LDL 31 and HDL of 50.  Addendum: Lipid profile performed 09/16/2021 revealed total cholesterol 110, LDL of 32 and HDL 51.  Hypertension History of essential hypertension a blood pressure measured today 120/70.  She is on amlodipine and metoprolol.  CAD S/P percutaneous coronary angioplasty History of CAD status post non-STEMI 09/16/2020 with troponins went up to 95.  Cardiac catheterization performed the following day by Dr. Martinique revealed a mid LAD lesion which was stented.  She had preserved LV function.  She is on aspirin and clopidogrel.  She was initially on Brilinta but had side effects.     Jamie Harp MD FACP,FACC,FAHA,  [facility] 10/06/2021 1:53 PM

## 2021-04-09 NOTE — Assessment & Plan Note (Signed)
History of CAD status post non-STEMI 09/16/2020 with troponins went up to 95.  Cardiac catheterization performed the following day by Dr. Martinique revealed a mid LAD lesion which was stented.  She had preserved LV function.  She is on aspirin and clopidogrel.  She was initially on Brilinta but had side effects.

## 2021-04-09 NOTE — Assessment & Plan Note (Signed)
History of essential hypertension a blood pressure measured today 120/70.  She is on amlodipine and metoprolol.

## 2021-04-09 NOTE — Patient Instructions (Signed)
Medication Instructions:  Your Physician recommend you continue on your current medication as directed.    *If you need a refill on your cardiac medications before your next appointment, please call your pharmacy*   Lab Work: None ordered today   Testing/Procedures: None ordered today   Follow-Up: At [facility], you and your health needs are our priority.  As part of our continuing mission to provide you with exceptional heart care, we have created designated Provider Care Teams.  These Care Teams include your primary Cardiologist (physician) and Advanced Practice Providers (APPs -  Physician Assistants and Nurse Practitioners) who all work together to provide you with the care you need, when you need it.  We recommend signing up for the patient portal called "MyChart".  Sign up information is provided on this After Visit Summary.  MyChart is used to connect with patients for Virtual Visits (Telemedicine).  Patients are able to view lab/test results, encounter notes, upcoming appointments, etc.  Non-urgent messages can be sent to your provider as well.   To learn more about what you can do with MyChart, go to [email or web address].    Your next appointment:   1 year(s)  The format for your next appointment:   In Person  Provider:   Jonathan Berry, MD    

## 2021-05-19 DIAGNOSIS — N183 Chronic kidney disease, stage 3 unspecified: Secondary | ICD-10-CM | POA: Diagnosis not present

## 2021-05-19 DIAGNOSIS — E119 Type 2 diabetes mellitus without complications: Secondary | ICD-10-CM | POA: Diagnosis not present

## 2021-05-19 DIAGNOSIS — M545 Low back pain, unspecified: Secondary | ICD-10-CM | POA: Diagnosis not present

## 2021-05-20 DIAGNOSIS — L821 Other seborrheic keratosis: Secondary | ICD-10-CM | POA: Diagnosis not present

## 2021-05-20 DIAGNOSIS — D225 Melanocytic nevi of trunk: Secondary | ICD-10-CM | POA: Diagnosis not present

## 2021-05-20 DIAGNOSIS — D1801 Hemangioma of skin and subcutaneous tissue: Secondary | ICD-10-CM | POA: Diagnosis not present

## 2021-05-20 DIAGNOSIS — L853 Xerosis cutis: Secondary | ICD-10-CM | POA: Diagnosis not present

## 2021-05-20 DIAGNOSIS — Z85828 Personal history of other malignant neoplasm of skin: Secondary | ICD-10-CM | POA: Diagnosis not present

## 2021-05-20 DIAGNOSIS — L814 Other melanin hyperpigmentation: Secondary | ICD-10-CM | POA: Diagnosis not present

## 2021-05-20 DIAGNOSIS — L570 Actinic keratosis: Secondary | ICD-10-CM | POA: Diagnosis not present

## 2021-06-03 DIAGNOSIS — N1832 Chronic kidney disease, stage 3b: Secondary | ICD-10-CM | POA: Diagnosis not present

## 2021-06-08 ENCOUNTER — Ambulatory Visit: Payer: Medicare Other | Attending: Family Medicine | Admitting: Physical Therapy

## 2021-06-08 ENCOUNTER — Encounter: Payer: Self-pay | Admitting: Physical Therapy

## 2021-06-08 ENCOUNTER — Other Ambulatory Visit: Payer: Self-pay

## 2021-06-08 DIAGNOSIS — M6281 Muscle weakness (generalized): Secondary | ICD-10-CM | POA: Diagnosis not present

## 2021-06-08 DIAGNOSIS — M545 Low back pain, unspecified: Secondary | ICD-10-CM | POA: Insufficient documentation

## 2021-06-08 DIAGNOSIS — G8929 Other chronic pain: Secondary | ICD-10-CM | POA: Insufficient documentation

## 2021-06-08 NOTE — Therapy (Signed)
[facility] @ [place], [place], [address] Phone: [phone]   Fax:  [phone]  Physical Therapy Evaluation  Patient Details  Name: Jamie Benjamin MRN: [id number] Date of Birth: May 01, 1946 Referring Provider (PT): Dr. Lindell Noe   Encounter Date: 06/08/2021   PT End of Session - 06/08/21 1835     Visit Number 1    Date for PT Re-Evaluation 08/03/21    Authorization Type Medicare 10th visit progress note    Progress Note Due on Visit 10    PT Start Time 1230    PT Stop Time 1315    PT Time Calculation (min) 45 min    Activity Tolerance Patient tolerated treatment well             Past Medical History:  Diagnosis Date   Allergy    seasonal   Arthritis    Breast cancer ([place]) 2005   left   Depression    Diverticulitis    Hyperlipidemia    Hypertension    Hypothyroidism (acquired)    IBS (irritable bowel syndrome)    Obesity    Prediabetes    Stage 3a chronic kidney disease ([place]) 09/16/2020    Past Surgical History:  Procedure Laterality Date   COLONOSCOPY     CORONARY STENT INTERVENTION N/A 09/17/2020   Procedure: CORONARY STENT INTERVENTION;  Surgeon: Martinique, Peter M, MD;  Location: [facility];  Service: Cardiovascular;  Laterality: N/A;   INTRAVASCULAR IMAGING/OCT N/A 09/17/2020   Procedure: INTRAVASCULAR IMAGING/OCT;  Surgeon: Martinique, Peter M, MD;  Location: [facility];  Service: Cardiovascular;  Laterality: N/A;   LEFT HEART CATH AND CORONARY ANGIOGRAPHY N/A 09/17/2020   Procedure: LEFT HEART CATH AND CORONARY ANGIOGRAPHY;  Surgeon: Martinique, Peter M, MD;  Location: [facility];  Service: Cardiovascular;  Laterality: N/A;   MASTECTOMY, RADICAL Bilateral 10 years ago   ROTATOR CUFF REPAIR      There were no vitals filed for this visit.    Subjective Assessment - 06/08/21 1234     Subjective compression fractures 6 after a fall a few years ago;  2 months of constant  pain back pain; wonders if it's the statin.  Hip issues left worse than right; history of sciatica in the past spontaneously resolved in a few weeks    Pertinent History CAD/MI with stroke;  HTN;  on a statin; kidney disease so can't take NSAIDS; 6 lumbar compression fracture    Limitations Sitting;Standing;Walking;House hold activities    How long can you sit comfortably? I can watch a movie with change of position    How long can you walk comfortably? unsure 30 minutes too long    Diagnostic tests not in a while 6 years ago after compression fractures (lumbar)    Patient Stated Goals want to be painfree; cook for grandson; walk more;  get activity level up    Currently in Pain? Yes    Pain Score 4     Pain Location Back    Pain Orientation Right;Left   usually left   Pain Type Chronic pain    Aggravating Factors  sitting; lying down; walking on treadmill; walking usually worsens    Pain Relieving Factors standing                [facility] PT Assessment - 06/08/21 0001       Assessment   Medical Diagnosis lumbago with sciatica left    Referring  Provider (PT) Dr. Lindell Noe    Onset Date/Surgical Date --   2 months   Next MD Visit as needed    Prior Therapy no      Precautions   Precautions None      Restrictions   Weight Bearing Restrictions No      Balance Screen   Has the patient fallen in the past 6 months No    Has the patient had a decrease in activity level because of a fear of falling?  No    Is the patient reluctant to leave their home because of a fear of falling?  No      Home Environment   Living Environment Private residence    [place] to enter    Home Layout Two level      Prior Function   Level of Independence Independent with basic ADLs    Vocation Retired    Biomedical scientist retired Marine scientist    Leisure spend time with family      Observation/Other Assessments   Focus on Therapeutic Outcomes (FOTO)  59%       Posture/Postural Control   Posture/Postural Control Postural limitations    Postural Limitations Increased thoracic kyphosis;Decreased lumbar lordosis    Posture Comments right shoulder elevated; right shift      AROM   Overall AROM Comments painful with lying supine even with knees bent; right sidelying good; painful left sidelying    Lumbar Flexion 60    Lumbar Extension 10    Lumbar - Right Side Bend 25    Lumbar - Left Side Bend 20      Strength   Overall Strength Comments able to rise sit to stand without UE assist; painful supine abdominal brace but right sidelying abdominal brace OK    Right/Left Hip Left;Right    Right Hip Flexion 4-/5    Right Hip Extension 4-/5    Right Hip ABduction 4-/5    Left Hip Flexion 4-/5    Left Hip Extension 4-/5    Left Hip ABduction 4-/5    Lumbar Flexion 3/5    Lumbar Extension 3+/5      Palpation   Palpation comment dec thoracolumbar fascial mobility                        Objective measurements completed on examination: See above findings.                  PT Short Term Goals - 06/08/21 1854       PT SHORT TERM GOAL #1   Title The patient will demonstrate compliance with initial HEP    Time 4    Period Weeks    Status New    Target Date 07/06/21      PT SHORT TERM GOAL #2   Title The patient will report a 30% improvement in sitting, walking and mobility with household tasks    Time 4    Period Weeks    Status New      PT SHORT TERM GOAL #3   Title The patient will initiate a walking program 5-10 min    Time 4    Period Weeks    Status New               PT Long Term Goals - 06/08/21 1858       PT LONG TERM GOAL #1   Title  The patient will be independent in a HEP or community based ex program    Time 8    Period Weeks    Status New    Target Date 08/03/21      PT LONG TERM GOAL #2   Title The patient will report a 60% improvement in sitting, walking and household chores     Time 8    Period Weeks    Status New      PT LONG TERM GOAL #3   Title The patient will have improved lumbo/pelvic and hip strength grossly 4 to 4+/5 needed for carrying groceries    Time 8    Period Weeks    Status New      PT LONG TERM GOAL #4   Title Improved lumbar ROM especially extension and left sidebending as well as hip mobility needed for greater ease with picking up things from the floor and to improve stride length    Time 8    Period Weeks    Status New      PT LONG TERM GOAL #5   Title FOTO score improved from 59% to 67%    Time 8    Period Weeks    Status New                    Plan - 06/08/21 1836     Clinical Impression Statement A few years ago the patient fell sustaining multiple lumbar compression fractures.  She states she recovered fairly well with minimal impairment.  A couple of months ago she began having constant low back and hip pain bilaterally.  She reports the pain is worsened with sitting, lying supine, and walking.  She had to discontinue walking on her treadmill.  Postural changes noted in standing: right shoulder/trunk elevated, decreased lumbar lordosis.  Decreased lumbar extension and left sidebending ROM.  Decreased hip flexion and extension mobility.  Unable to tolerate lying supine or left sidelying but able to lie in right sidelying comfortably.  Decreased activation of transverse abdominus muscles.  Decreased hip strength bilaterally.  Decreased thoracolumbar and gluteal myofascial mobility.  She would benefit from PT to address these deficits.  MD referral for aquatic PT however pt states she is "not a fan of the water" and is receptive to land PT at this time.    Personal Factors and Comorbidities Comorbidity 1;Comorbidity 2;Comorbidity 3+    Comorbidities MI with stroke following (released by cardiologist no restrictions); kidney disease; HTN; diabetes; history of compression fx lumbar    Examination-Activity Limitations Locomotion  Level;Sit;Lift;Sleep;Carry    Examination-Participation Restrictions Cleaning;Shop;Laundry;Meal Prep    Stability/Clinical Decision Making Stable/Uncomplicated    Clinical Decision Making Low    Rehab Potential Good    PT Frequency 2x / week    PT Duration 8 weeks    PT Treatment/Interventions ADLs/Self Care Home Management;Aquatic Therapy;Cryotherapy;Electrical Stimulation;Ultrasound;Traction;Moist Heat;Therapeutic activities;Iontophoresis 4mg /ml Dexamethasone;Therapeutic exercise;Neuromuscular re-education;Manual techniques;Patient/family education;Dry needling;Taping    PT Next Visit Plan Nu-Step; seated foam roll press down; seated chops; hip flexor stretch on steps; rows; right sidelying; start [place] and Agree with Plan of Care Patient             Patient will benefit from skilled therapeutic intervention in order to improve the following deficits and impairments:  Decreased range of motion, Increased fascial restricitons, Decreased strength, Postural dysfunction, Pain, Impaired perceived functional ability, Decreased activity tolerance  Visit Diagnosis: Chronic bilateral low back pain, unspecified whether  sciatica present - Plan: PT plan of care cert/re-cert  Muscle weakness (generalized) - Plan: PT plan of care cert/re-cert     Problem List Patient Active Problem List   Diagnosis Date Noted   NSTEMI (non-ST elevated myocardial infarction) ([place]) 32/09/3341   Embolic stroke ([place]) 56/86/1683   Abnormal MRI 10/05/2020   Confusion 72/90/2111   Metabolic encephalopathy 55/20/8022   CAD S/P percutaneous coronary angioplasty 09/19/2020   ACS (acute coronary syndrome) ([place]) 09/16/2020   CKD (chronic kidney disease), stage III ([place]) 09/16/2020   Breast cancer ([place]) 07/02/2019   Diverticulitis 07/02/2019   Dyslipidemia, goal LDL below 70 07/02/2019   Hypertension 07/02/2019   IBS (irritable bowel syndrome) 07/02/2019   Hypothyroidism 07/02/2019   Major  depressive disorder with current active episode 07/02/2019   Class 1 obesity due to excess calories with body mass index (BMI) of 33.0 to 33.9 in adult 07/02/2019   Osteoporosis 07/02/2019   Pernicious anemia 07/02/2019   Prediabetes 07/02/2019   Recurrent major depressive disorder, in partial remission ([facility]) 07/02/2019   Sciatica of left side 07/02/2019   Ruben Im, PT 06/08/21 7:03 PM Phone: [phone] Fax: [phone]  Alvera Singh, PT 06/08/2021, 7:03 PM  [facility] @ [place], [place], [address] Phone: [phone]   Fax:  [phone]  Name: DABRIA WADAS MRN: [id number] Date of Birth: 1946-06-25

## 2021-06-09 DIAGNOSIS — I129 Hypertensive chronic kidney disease with stage 1 through stage 4 chronic kidney disease, or unspecified chronic kidney disease: Secondary | ICD-10-CM | POA: Diagnosis not present

## 2021-06-09 DIAGNOSIS — N390 Urinary tract infection, site not specified: Secondary | ICD-10-CM | POA: Diagnosis not present

## 2021-06-09 DIAGNOSIS — N1832 Chronic kidney disease, stage 3b: Secondary | ICD-10-CM | POA: Diagnosis not present

## 2021-06-09 DIAGNOSIS — R809 Proteinuria, unspecified: Secondary | ICD-10-CM | POA: Diagnosis not present

## 2021-06-09 DIAGNOSIS — N2581 Secondary hyperparathyroidism of renal origin: Secondary | ICD-10-CM | POA: Diagnosis not present

## 2021-06-09 DIAGNOSIS — D631 Anemia in chronic kidney disease: Secondary | ICD-10-CM | POA: Diagnosis not present

## 2021-06-10 ENCOUNTER — Other Ambulatory Visit: Payer: Self-pay

## 2021-06-10 ENCOUNTER — Ambulatory Visit: Payer: Medicare Other | Admitting: Physical Therapy

## 2021-06-10 DIAGNOSIS — M6281 Muscle weakness (generalized): Secondary | ICD-10-CM | POA: Diagnosis not present

## 2021-06-10 DIAGNOSIS — M545 Low back pain, unspecified: Secondary | ICD-10-CM | POA: Diagnosis not present

## 2021-06-10 DIAGNOSIS — G8929 Other chronic pain: Secondary | ICD-10-CM

## 2021-06-10 NOTE — Therapy (Signed)
[facility] @ [address] [place], [place], [address] Phone: [phone]   Fax:  [phone]  Physical Therapy Treatment  Patient Details  Name: Jamie Benjamin MRN: [id number] Date of Birth: [birth year] Referring Provider (PT): Dr. Lindell Noe   Encounter Date: 06/10/2021   PT End of Session - 06/10/21 1350     Visit Number 2    Date for PT Re-Evaluation 08/03/21    Authorization Type Medicare 10th visit progress note    Progress Note Due on Visit 10    PT Start Time 1015    PT Stop Time 1055    PT Time Calculation (min) 40 min    Activity Tolerance Patient tolerated treatment well             Past Medical History:  Diagnosis Date   Allergy    seasonal   Arthritis    Breast cancer ([place]) 2005   left   Depression    Diverticulitis    Hyperlipidemia    Hypertension    Hypothyroidism (acquired)    IBS (irritable bowel syndrome)    Obesity    Prediabetes    Stage 3a chronic kidney disease ([place]) 09/16/2020    Past Surgical History:  Procedure Laterality Date   COLONOSCOPY     CORONARY STENT INTERVENTION N/A 09/17/2020   Procedure: CORONARY STENT INTERVENTION;  Surgeon: Martinique, Peter M, MD;  Location: [facility];  Service: Cardiovascular;  Laterality: N/A;   INTRAVASCULAR IMAGING/OCT N/A 09/17/2020   Procedure: INTRAVASCULAR IMAGING/OCT;  Surgeon: Martinique, Peter M, MD;  Location: [facility];  Service: Cardiovascular;  Laterality: N/A;   LEFT HEART CATH AND CORONARY ANGIOGRAPHY N/A 09/17/2020   Procedure: LEFT HEART CATH AND CORONARY ANGIOGRAPHY;  Surgeon: Martinique, Peter M, MD;  Location: [facility];  Service: Cardiovascular;  Laterality: N/A;   MASTECTOMY, RADICAL Bilateral 10 years ago   ROTATOR CUFF REPAIR      There were no vitals filed for this visit.   Subjective Assessment - 06/10/21 1020     Subjective I'm better.  3 days off statin.    Pertinent History CAD/MI with stroke;   HTN;  on a statin; kidney disease so can't take NSAIDS; 6 lumbar compression fracture    Currently in Pain? Yes    Pain Score 2     Pain Location Back    Pain Type Chronic pain                               [facility] Adult PT Treatment/Exercise - 06/10/21 0001       Lumbar Exercises: Stretches   Other Lumbar Stretch Exercise 2nd step hip flexor stretch with UE elevation 5x right/left      Lumbar Exercises: Aerobic   Nustep L1-4  6 min seat 5      Lumbar Exercises: Machines for Strengthening   Other Lumbar Machine Exercise seated 15# row 15x      Lumbar Exercises: Standing   Row Strengthening;Both;20 reps;Theraband    Theraband Level (Row) Level 3 (Green)    Row Limitations from doorknob level and high rows from top of the door    Shoulder Extension Both;Strengthening;15 reps;Theraband    Theraband Level (Shoulder Extension) Level 3 (Green)    Shoulder Extension Limitations anchored over the top of the door      Lumbar Exercises: Seated   Sit to Stand 10 reps  Sit to Stand Limitations holding 5# weight    Other Seated Lumbar Exercises core series with 4.4# plyo ball yellow                       PT Short Term Goals - 06/08/21 1854       PT SHORT TERM GOAL #1   Title The patient will demonstrate compliance with initial HEP    Time 4    Period Weeks    Status New    Target Date 07/06/21      PT SHORT TERM GOAL #2   Title The patient will report a 30% improvement in sitting, walking and mobility with household tasks    Time 4    Period Weeks    Status New      PT SHORT TERM GOAL #3   Title The patient will initiate a walking program 5-10 min    Time 4    Period Weeks    Status New               PT Long Term Goals - 06/08/21 1858       PT LONG TERM GOAL #1   Title The patient will be independent in a HEP or community based ex program    Time 8    Period Weeks    Status New    Target Date 08/03/21      PT LONG TERM GOAL  #2   Title The patient will report a 60% improvement in sitting, walking and household chores    Time 8    Period Weeks    Status New      PT LONG TERM GOAL #3   Title The patient will have improved lumbo/pelvic and hip strength grossly 4 to 4+/5 needed for carrying groceries    Time 8    Period Weeks    Status New      PT LONG TERM GOAL #4   Title Improved lumbar ROM especially extension and left sidebending as well as hip mobility needed for greater ease with picking up things from the floor and to improve stride length    Time 8    Period Weeks    Status New      PT LONG TERM GOAL #5   Title FOTO score improved from 59% to 67%    Time 8    Period [place] - 06/10/21 1349     Clinical Impression Statement Patient states, "I was worried about coming today because I thought you would make me lie down which makes me really nauseated. But I feel really good with what we did today."  Initated core and LE strengthening in positions most comfortable for patient (sitting and standing).  Verbal cues for feet flat on floor and "sit tall" for best activation of lower abdominals.  Given green band for standing core ex with handles added for comfort.  Therapist monitoring response throughout session with patient reporting a very positive response with minimal pain.    Comorbidities MI with stroke following (released by cardiologist no restrictions); kidney disease; HTN; diabetes; history of compression fx lumbar    Examination-Activity Limitations Locomotion Level;Sit;Lift;Sleep;Carry    Examination-Participation Restrictions Cleaning;Shop;Laundry;Meal Prep    PT Frequency 2x / week    PT Duration 8 weeks    PT Treatment/Interventions ADLs/Self  Care Home Management;Aquatic Therapy;Cryotherapy;Electrical Stimulation;Ultrasound;Traction;Moist Heat;Therapeutic activities;Iontophoresis 4mg /ml Dexamethasone;Therapeutic exercise;Neuromuscular  re-education;Manual techniques;Patient/family education;Dry needling;Taping    PT Next Visit Plan Nu-Step; seated or standing hip and lumbar mobility ex's; seated or standing core ex's;  review standing green band ex's if needed (handles for comfort)    PT Home Exercise Plan [email or web address]    Consulted and Agree with Plan of Care Patient             Patient will benefit from skilled therapeutic intervention in order to improve the following deficits and impairments:  Decreased range of motion, Increased fascial restricitons, Decreased strength, Postural dysfunction, Pain, Impaired perceived functional ability, Decreased activity tolerance  Visit Diagnosis: Chronic bilateral low back pain, unspecified whether sciatica present  Muscle weakness (generalized)     Problem List Patient Active Problem List   Diagnosis Date Noted   NSTEMI (non-ST elevated myocardial infarction) ([facility]) 57/84/6962   Embolic stroke ([place]) 95/28/4132   Abnormal MRI 10/05/2020   Confusion 44/08/270   Metabolic encephalopathy 53/66/4403   CAD S/P percutaneous coronary angioplasty 09/19/2020   ACS (acute coronary syndrome) ([place]) 09/16/2020   CKD (chronic kidney disease), stage III ([place] [place]) 09/16/2020   Breast cancer ([facility]) 07/02/2019   Diverticulitis 07/02/2019   Dyslipidemia, goal LDL below 70 07/02/2019   Hypertension 07/02/2019   IBS (irritable bowel syndrome) 07/02/2019   Hypothyroidism 07/02/2019   Major depressive disorder with current active episode 07/02/2019   Class 1 obesity due to excess calories with body mass index (BMI) of 33.0 to 33.9 in adult 07/02/2019   Osteoporosis 07/02/2019   Pernicious anemia 07/02/2019   Prediabetes 07/02/2019   Recurrent major depressive disorder, in partial remission ([place]) 07/02/2019   Sciatica of left side 07/02/2019   Ruben Im, PT 06/10/21 4:49 PM Phone: [phone] Fax: [phone]  Alvera Singh, PT 06/10/2021, 4:48 PM  [facility] @ [address] [place], [place], [address] Phone: [phone]   Fax:  [phone]  Name: Jamie Benjamin MRN: [id number] Date of Birth: 03-02-1946

## 2021-06-10 NOTE — Patient Instructions (Signed)
Access Code: [email or web address] URL: [email or web address] Date: 06/10/2021 Prepared by: Ruben Im  Exercises Standing Bilateral Low Shoulder Row with Anchored Resistance - 1 x daily - 7 x weekly - 1 sets - 10 reps Shoulder extension with resistance - Neutral - 1 x daily - 7 x weekly - 1 sets - 10 reps Standing High Row with Anchored Resistance - 1 x daily - 7 x weekly - 1 sets - 10 reps Arm beats - 1 x daily - 7 x weekly - 4 sets - 10 reps Sit to Stand - 1 x daily - 7 x weekly - 1 sets - 10 reps

## 2021-06-14 ENCOUNTER — Ambulatory Visit: Payer: Medicare Other | Admitting: Rehabilitative and Restorative Service Providers"

## 2021-06-14 ENCOUNTER — Other Ambulatory Visit: Payer: Self-pay

## 2021-06-14 ENCOUNTER — Encounter: Payer: Self-pay | Admitting: Rehabilitative and Restorative Service Providers"

## 2021-06-14 DIAGNOSIS — M545 Low back pain, unspecified: Secondary | ICD-10-CM

## 2021-06-14 DIAGNOSIS — G8929 Other chronic pain: Secondary | ICD-10-CM | POA: Diagnosis not present

## 2021-06-14 DIAGNOSIS — M6281 Muscle weakness (generalized): Secondary | ICD-10-CM

## 2021-06-14 NOTE — Therapy (Signed)
[facility] @ [address], [place], [address] Phone: [phone]   Fax:  [phone]  Physical Therapy Treatment  Patient Details  Name: Jamie Benjamin MRN: [id number] Date of Birth: 05/17/1946 Referring Provider (PT): Dr. Lindell Noe   Encounter Date: 06/14/2021   PT End of Session - 06/14/21 1153     Visit Number 3    Date for PT Re-Evaluation 08/03/21    Authorization Type Medicare 10th visit progress note    Progress Note Due on Visit 10    PT Start Time 1145    PT Stop Time 1225    PT Time Calculation (min) 40 min    Activity Tolerance Patient tolerated treatment well    Behavior During Therapy [facility] for tasks assessed/performed             Past Medical History:  Diagnosis Date   Allergy    seasonal   Arthritis    Breast cancer ([place]) 2005   left   Depression    Diverticulitis    Hyperlipidemia    Hypertension    Hypothyroidism (acquired)    IBS (irritable bowel syndrome)    Obesity    Prediabetes    Stage 3a chronic kidney disease ([place]) 09/16/2020    Past Surgical History:  Procedure Laterality Date   COLONOSCOPY     CORONARY STENT INTERVENTION N/A 09/17/2020   Procedure: CORONARY STENT INTERVENTION;  Surgeon: Martinique, Peter M, MD;  Location: [facility];  Service: Cardiovascular;  Laterality: N/A;   INTRAVASCULAR IMAGING/OCT N/A 09/17/2020   Procedure: INTRAVASCULAR IMAGING/OCT;  Surgeon: Martinique, Peter M, MD;  Location: [facility];  Service: Cardiovascular;  Laterality: N/A;   LEFT HEART CATH AND CORONARY ANGIOGRAPHY N/A 09/17/2020   Procedure: LEFT HEART CATH AND CORONARY ANGIOGRAPHY;  Surgeon: Martinique, Peter M, MD;  Location: [facility];  Service: Cardiovascular;  Laterality: N/A;   MASTECTOMY, RADICAL Bilateral 10 years ago   ROTATOR CUFF REPAIR      There were no vitals filed for this visit.   Subjective Assessment - 06/14/21 1152     Subjective Pt reports that  she has been very busy, so she only did her HEP once.  She said that she has good days and bad days since going off statins, so she isn't sure that it is the statin.    Patient Stated Goals want to be painfree; cook for grandson; walk more;  get activity level up    Currently in Pain? Yes    Pain Score 2     Pain Location Back    Pain Orientation Lower    Pain Type Chronic pain                               [facility] Adult PT Treatment/Exercise - 06/14/21 0001       Lumbar Exercises: Stretches   Passive Hamstring Stretch Right;Left;2 reps;20 seconds    Passive Hamstring Stretch Limitations standing on step    Gastroc Stretch Right;Left;2 reps;20 seconds    Other Lumbar Stretch Exercise 2nd step hip flexor stretch with UE elevation 5x right/left      Lumbar Exercises: Aerobic   Nustep L4 x6 min, seat at 5.  PT present to discuss progress.      Lumbar Exercises: Machines for Strengthening   Leg Press 45# 2x10    Other Lumbar Machine Exercise Rows and Lats  25# 2x10 each      Lumbar Exercises: Standing   Heel Raises 20 reps    Functional Squats 20 reps    Row Strengthening;Both;20 reps;Theraband    Theraband Level (Row) Level 3 (Green)    Shoulder Extension Strengthening;Both;20 reps    Theraband Level (Shoulder Extension) Level 3 (Green)    Other Standing Lumbar Exercises Step Ups:  lateral and fwd x10 B with unilateral rail.  Step down x10 B with B rail      Lumbar Exercises: Seated   Sit to Stand 10 reps    Sit to Stand Limitations holding 5# weight    Other Seated Lumbar Exercises core series with 4.4# plyo ball                       PT Short Term Goals - 06/14/21 1228       PT SHORT TERM GOAL #1   Title The patient will demonstrate compliance with initial HEP    Status Partially Met      PT SHORT TERM GOAL #2   Title The patient will report a 30% improvement in sitting, walking and mobility with household tasks    Status On-going                PT Long Term Goals - 06/08/21 1858       PT LONG TERM GOAL #1   Title The patient will be independent in a HEP or community based ex program    Time 8    Period Weeks    Status New    Target Date 08/03/21      PT LONG TERM GOAL #2   Title The patient will report a 60% improvement in sitting, walking and household chores    Time 8    Period Weeks    Status New      PT LONG TERM GOAL #3   Title The patient will have improved lumbo/pelvic and hip strength grossly 4 to 4+/5 needed for carrying groceries    Time 8    Period Weeks    Status New      PT LONG TERM GOAL #4   Title Improved lumbar ROM especially extension and left sidebending as well as hip mobility needed for greater ease with picking up things from the floor and to improve stride length    Time 8    Period Weeks    Status New      PT LONG TERM GOAL #5   Title FOTO score improved from 59% to 67%    Time 8    Period Weeks    Status New                   Plan - 06/14/21 1224     Clinical Impression Statement Pt tolerated session well and requires less cuing for posture during ther ex. Pt was able to add in additional weights and exercises and denied any increased pain during session. Pt reports that she has been performing HEP at home and is finding benefits.    PT Treatment/Interventions ADLs/Self Care Home Management;Aquatic Therapy;Cryotherapy;Electrical Stimulation;Ultrasound;Traction;Moist Heat;Therapeutic activities;Iontophoresis 1m/ml Dexamethasone;Therapeutic exercise;Neuromuscular re-education;Manual techniques;Patient/family education;Dry needling;Taping    PT Next Visit Plan Nu-Step; seated or standing hip and lumbar mobility ex's; seated or standing core ex's;  review standing green band ex's if needed (handles for comfort)    Consulted and Agree with Plan of Care Patient  Patient will benefit from skilled therapeutic intervention in order to improve the  following deficits and impairments:  Decreased range of motion, Increased fascial restricitons, Decreased strength, Postural dysfunction, Pain, Impaired perceived functional ability, Decreased activity tolerance  Visit Diagnosis: Chronic bilateral low back pain, unspecified whether sciatica present  Muscle weakness (generalized)     Problem List Patient Active Problem List   Diagnosis Date Noted   NSTEMI (non-ST elevated myocardial infarction) ([place]) 59/30/1237   Embolic stroke ([place]) 99/04/3999   Abnormal MRI 10/05/2020   Confusion 50/56/7889   Metabolic encephalopathy 33/88/2666   CAD S/P percutaneous coronary angioplasty 09/19/2020   ACS (acute coronary syndrome) ([place]) 09/16/2020   CKD (chronic kidney disease), stage III ([facility]) 09/16/2020   Breast cancer ([place]) 07/02/2019   Diverticulitis 07/02/2019   Dyslipidemia, goal LDL below 70 07/02/2019   Hypertension 07/02/2019   IBS (irritable bowel syndrome) 07/02/2019   Hypothyroidism 07/02/2019   Major depressive disorder with current active episode 07/02/2019   Class 1 obesity due to excess calories with body mass index (BMI) of 33.0 to 33.9 in adult 07/02/2019   Osteoporosis 07/02/2019   Pernicious anemia 07/02/2019   Prediabetes 07/02/2019   Recurrent major depressive disorder, in partial remission ([facility]) 07/02/2019   Sciatica of left side 07/02/2019    Juel Burrow, PT, DPT 06/14/2021, 12:28 PM  [facility] @ [address] [place], [place], [address] Phone: [phone]   Fax:  [phone]  Name: Jamie Benjamin MRN: [id number] Date of Birth: 02-Mar-1946

## 2021-06-15 DIAGNOSIS — N183 Chronic kidney disease, stage 3 unspecified: Secondary | ICD-10-CM | POA: Diagnosis not present

## 2021-06-15 DIAGNOSIS — E1122 Type 2 diabetes mellitus with diabetic chronic kidney disease: Secondary | ICD-10-CM | POA: Diagnosis not present

## 2021-06-15 DIAGNOSIS — Z7984 Long term (current) use of oral hypoglycemic drugs: Secondary | ICD-10-CM | POA: Diagnosis not present

## 2021-06-15 DIAGNOSIS — E1165 Type 2 diabetes mellitus with hyperglycemia: Secondary | ICD-10-CM | POA: Diagnosis not present

## 2021-06-17 ENCOUNTER — Ambulatory Visit: Payer: Medicare Other | Admitting: Physical Therapy

## 2021-06-17 ENCOUNTER — Other Ambulatory Visit: Payer: Self-pay

## 2021-06-17 DIAGNOSIS — M545 Low back pain, unspecified: Secondary | ICD-10-CM | POA: Diagnosis not present

## 2021-06-17 DIAGNOSIS — M6281 Muscle weakness (generalized): Secondary | ICD-10-CM

## 2021-06-17 DIAGNOSIS — G8929 Other chronic pain: Secondary | ICD-10-CM | POA: Diagnosis not present

## 2021-06-17 NOTE — Therapy (Addendum)
[facility] @ [address] [place], [place], [address] Phone: [phone]   Fax:  [phone]  Physical Therapy Treatment/Discharge Summary   Patient Details  Name: Jamie Benjamin MRN: [id number] Date of Birth: October 25, 1945 Referring Provider (PT): Dr. Lindell Noe   Encounter Date: 06/17/2021   PT End of Session - 06/17/21 1109     Visit Number 4    Date for PT Re-Evaluation 08/03/21    Authorization Type Medicare 10th visit progress note    Progress Note Due on Visit 10    PT Start Time 1015    PT Stop Time 1100    PT Time Calculation (min) 45 min    Activity Tolerance Patient tolerated treatment well             Past Medical History:  Diagnosis Date   Allergy    seasonal   Arthritis    Breast cancer ([place]) 2005   left   Depression    Diverticulitis    Hyperlipidemia    Hypertension    Hypothyroidism (acquired)    IBS (irritable bowel syndrome)    Obesity    Prediabetes    Stage 3a chronic kidney disease ([facility]) 09/16/2020    Past Surgical History:  Procedure Laterality Date   COLONOSCOPY     CORONARY STENT INTERVENTION N/A 09/17/2020   Procedure: CORONARY STENT INTERVENTION;  Surgeon: Martinique, Peter M, MD;  Location: [facility];  Service: Cardiovascular;  Laterality: N/A;   INTRAVASCULAR IMAGING/OCT N/A 09/17/2020   Procedure: INTRAVASCULAR IMAGING/OCT;  Surgeon: Martinique, Peter M, MD;  Location: [facility];  Service: Cardiovascular;  Laterality: N/A;   LEFT HEART CATH AND CORONARY ANGIOGRAPHY N/A 09/17/2020   Procedure: LEFT HEART CATH AND CORONARY ANGIOGRAPHY;  Surgeon: Martinique, Peter M, MD;  Location: [facility];  Service: Cardiovascular;  Laterality: N/A;   MASTECTOMY, RADICAL Bilateral 10 years ago   ROTATOR CUFF REPAIR      There were no vitals filed for this visit.   Subjective Assessment - 06/17/21 1018     Subjective It's doing better.  I went back on the statin.  Some  difficulty with the Medbridge app.  Muscles a little sore from last time but that's Ok.    Pertinent History CAD/MI with stroke;  HTN;  on a statin; kidney disease so can't take NSAIDS; 6 lumbar compression fracture    Currently in Pain? Yes    Pain Score 1     Pain Location Back                               [facility] Adult PT Treatment/Exercise - 06/17/21 0001       Lumbar Exercises: Stretches   Other Lumbar Stretch Exercise 2nd step hip flexor stretch with UE elevation 5x right/left      Lumbar Exercises: Aerobic   Nustep L6 x8 min, seat at 5.  PT present to discuss progress.      Lumbar Exercises: Machines for Strengthening   Leg Press 55# 20x      Lumbar Exercises: Standing   Functional Squats Limitations 2# snatch and press overhead 2x 10    Lifting Limitations counter top push ups 10x    Other Standing Lumbar Exercises standing lat bar pull downs 25# 2x10    Other Standing Lumbar Exercises resisted backward walk 15# 10x  PT Education - 06/17/21 1108     Education Details pt given info on community based ex including PREP program, [place], ACT and gym at [employer] at [employer]) Educated Patient    Methods Explanation;Handout    Comprehension Verbalized understanding              PT Short Term Goals - 06/17/21 1124       PT SHORT TERM GOAL #1   Title The patient will demonstrate compliance with initial HEP    Status Achieved      PT SHORT TERM GOAL #2   Title The patient will report a 30% improvement in sitting, walking and mobility with household tasks    Status Achieved      PT SHORT TERM GOAL #3   Title The patient will initiate a walking program 5-10 min    Time 4    Period Weeks    Status On-going               PT Long Term Goals - 06/08/21 1858       PT LONG TERM GOAL #1   Title The patient will be independent in a HEP or community based ex program    Time 8    Period Weeks     Status New    Target Date 08/03/21      PT LONG TERM GOAL #2   Title The patient will report a 60% improvement in sitting, walking and household chores    Time 8    Period Weeks    Status New      PT LONG TERM GOAL #3   Title The patient will have improved lumbo/pelvic and hip strength grossly 4 to 4+/5 needed for carrying groceries    Time 8    Period Weeks    Status New      PT LONG TERM GOAL #4   Title Improved lumbar ROM especially extension and left sidebending as well as hip mobility needed for greater ease with picking up things from the floor and to improve stride length    Time 8    Period Weeks    Status New      PT LONG TERM GOAL #5   Title FOTO score improved from 59% to 67%    Time 8    Period Weeks    Status New                   Plan - 06/17/21 1109     Clinical Impression Statement The patient is progessing well trunk and LE strengthening and symptom reduction.  She has some difficulty maneuvering her legs onto the leg press and uses her arms to assist but otherwise her pain intensity remains low.  Discussed community ex options that may be a good fit for after PT.    Comorbidities MI with stroke following (released by cardiologist no restrictions); kidney disease; HTN; diabetes; history of compression fx lumbar    Examination-Activity Limitations Locomotion Level;Sit;Lift;Sleep;Carry    Rehab Potential Good    PT Frequency 2x / week    PT Duration 8 weeks    PT Treatment/Interventions ADLs/Self Care Home Management;Aquatic Therapy;Cryotherapy;Electrical Stimulation;Ultrasound;Traction;Moist Heat;Therapeutic activities;Iontophoresis 61m/ml Dexamethasone;Therapeutic exercise;Neuromuscular re-education;Manual techniques;Patient/family education;Dry needling;Taping    PT Next Visit Plan Nu-Step; seated or standing hip and lumbar mobility ex's; lumbopelvichip core strengthening;  HEP progression    PT Home Exercise Plan [facility]  Patient  will benefit from skilled therapeutic intervention in order to improve the following deficits and impairments:  Decreased range of motion, Increased fascial restricitons, Decreased strength, Postural dysfunction, Pain, Impaired perceived functional ability, Decreased activity tolerance  Visit Diagnosis: Chronic bilateral low back pain, unspecified whether sciatica present  Muscle weakness (generalized)  PHYSICAL THERAPY DISCHARGE SUMMARY  Visits from Start of Care: 4  Current functional level related to goals / functional outcomes:  The patient called to cancel her remaining appts stating she was feeling much better.  [facility].     Remaining deficits: As above   Education / Equipment: HEP   Patient agrees to discharge. Patient goals were met. Patient is being discharged due to being pleased with the current functional level.    Problem List Patient Active Problem List   Diagnosis Date Noted   NSTEMI (non-ST elevated myocardial infarction) ([facility]) 08/81/1031   Embolic stroke ([place]) 59/45/8592   Abnormal MRI 10/05/2020   Confusion 92/44/6286   Metabolic encephalopathy 38/17/7116   CAD S/P percutaneous coronary angioplasty 09/19/2020   ACS (acute coronary syndrome) ([place]) 09/16/2020   CKD (chronic kidney disease), stage III ([place]) 09/16/2020   Breast cancer ([place]) 07/02/2019   Diverticulitis 07/02/2019   Dyslipidemia, goal LDL below 70 07/02/2019   Hypertension 07/02/2019   IBS (irritable bowel syndrome) 07/02/2019   Hypothyroidism 07/02/2019   Major depressive disorder with current active episode 07/02/2019   Class 1 obesity due to excess calories with body mass index (BMI) of 33.0 to 33.9 in adult 07/02/2019   Osteoporosis 07/02/2019   Pernicious anemia 07/02/2019   Prediabetes 07/02/2019   Recurrent major depressive disorder, in partial remission ([place]) 07/02/2019   Sciatica of left side  07/02/2019   Ruben Im, PT 06/17/21 11:25 AM Phone: [phone] Fax: [phone]  Alvera Singh, PT 06/17/2021, 11:24 AM  [facility] @ [address] [place], [place], [address] Phone: [phone]   Fax:  [phone]  Name: Jamie Benjamin MRN: [id number] Date of Birth: 03-08-1946

## 2021-06-22 ENCOUNTER — Encounter: Payer: Medicare Other | Admitting: Physical Therapy

## 2021-06-29 ENCOUNTER — Encounter: Payer: Medicare Other | Admitting: Physical Therapy

## 2021-07-07 ENCOUNTER — Telehealth: Payer: Self-pay

## 2021-07-07 NOTE — Telephone Encounter (Signed)
Called to discuss PREP program at [facility], left voicemail

## 2021-07-16 ENCOUNTER — Telehealth: Payer: Self-pay

## 2021-07-16 NOTE — Telephone Encounter (Signed)
Follow up call from 12/7; called to let her know next PREP program at [facility] would be mid-late January; will call back after first of year once dates/times are set.

## 2021-08-09 ENCOUNTER — Telehealth: Payer: Self-pay

## 2021-08-09 NOTE — Telephone Encounter (Signed)
Called to discuss Jamie Benjamin PREP schedule; wants to attend M/W class starting 2/6 at 2-3:15p; will call back end of Jan to schedule assessment visit

## 2021-08-27 ENCOUNTER — Telehealth: Payer: Self-pay

## 2021-08-27 NOTE — Telephone Encounter (Signed)
Called to confirm she wants to attend PREP at [facility] 2/6, every M/W from 2-3:15; assessment visit scheduled for Monday 1/30 at 2pm.

## 2021-08-30 NOTE — Progress Notes (Signed)
[facility] PREP Evaluation  Patient Details  Name: Jamie Benjamin MRN: [id number] Date of Birth: March 29, 1946 Age: 76 y.o. PCP: Maurice Small, MD  Vitals:   08/30/21 1513  BP: 130/60  Pulse: 77  SpO2: 96%  Weight: 171 lb 3.2 oz (77.7 kg)     [facility] Eval - 08/30/21 1500       [facility] "PREP" Location   [facility] "PREP" Location [facility]      Referral    Referring Provider --   [place]   Reason for referral Hypertension;Inactivity;Obesitity/Overweight    Program Start Date 09/06/21      Measurement   Waist Circumference 47 inches    Hip Circumference 47 inches    Body fat --   E2     Information for Trainer   Goals --   Move without less pain; lose 10 pounds by the end of the program   Current Exercise None    Orthopedic Concerns --   Back pain, S/P fx wrists   Pertinent Medical History --   HTN, S/P MI 2022, CKD   Current Barriers --   motivation   Medications that affect exercise Beta blocker      Timed Up and Go (TUGS)   Timed Up and Go Low risk <9 seconds      Mobility and Daily Activities   I find it easy to walk up or down two or more flights of stairs. 2    I have no trouble taking out the trash. 4    I do housework such as vacuuming and dusting on my own without difficulty. 4    I can easily lift a gallon of milk (8lbs). 2    I can easily walk a mile. 1    I have no trouble reaching into high cupboards or reaching down to pick up something from the floor. 3    I do not have trouble doing out-door work such as Armed forces logistics/support/administrative officer, raking leaves, or gardening. 2      Mobility and Daily Activities   I feel younger than my age. 2    I feel independent. 4    I feel energetic. 1    I live an active life.  1    I feel strong. 1    I feel healthy. 1    I feel active as other people my age. 2      How fit and strong are you.   Fit and Strong Total Score 30            Past Medical History:  Diagnosis Date   Allergy    seasonal   Arthritis    Breast cancer  ([place]) 2005   left   Depression    Diverticulitis    Hyperlipidemia    Hypertension    Hypothyroidism (acquired)    IBS (irritable bowel syndrome)    Obesity    Prediabetes    Stage 3a chronic kidney disease ([place]) 09/16/2020   Past Surgical History:  Procedure Laterality Date   COLONOSCOPY     CORONARY STENT INTERVENTION N/A 09/17/2020   Procedure: CORONARY STENT INTERVENTION;  Surgeon: Martinique, Peter M, MD;  Location: [facility];  Service: Cardiovascular;  Laterality: N/A;   INTRAVASCULAR IMAGING/OCT N/A 09/17/2020   Procedure: INTRAVASCULAR IMAGING/OCT;  Surgeon: Martinique, Peter M, MD;  Location: [place];  Service: Cardiovascular;  Laterality: N/A;   LEFT HEART CATH AND CORONARY ANGIOGRAPHY N/A 09/17/2020  Procedure: LEFT HEART CATH AND CORONARY ANGIOGRAPHY;  Surgeon: Martinique, Peter M, MD;  Location: [facility];  Service: Cardiovascular;  Laterality: N/A;   MASTECTOMY, RADICAL Bilateral 10 years ago   ROTATOR CUFF REPAIR     Social History   Tobacco Use  Smoking Status Never  Smokeless Tobacco Never   To begin PREP Class at [facility] 2/6, every M/W 2-3:15pm  Jeriko Kowalke B Chanze Teagle 08/30/2021, 3:16 PM

## 2021-09-06 NOTE — Progress Notes (Signed)
[facility] PREP Weekly Session  Patient Details  Name: Jamie Benjamin MRN: [id number] Date of Birth: 09/12/1945 Age: 76 y.o. PCP: Maurice Small, MD  There were no vitals filed for this visit.   [facility] Weekly seesion - 09/06/21 1500       [facility] "PREP" Location   [facility] "PREP" Location [facility]      Weekly Session   Topic Discussed Goal setting and welcome to the program   Introductions; Review of program and handbook; tour of facility   Classes attended to date [place] 09/06/2021, 3:26 PM

## 2021-09-13 NOTE — Progress Notes (Signed)
[facility] PREP Weekly Session  Patient Details  Name: Jamie Benjamin MRN: [id number] Date of Birth: April 22, 1946 Age: 76 y.o. PCP: Maurice Small, MD  There were no vitals filed for this visit.   [facility] Weekly seesion - 09/13/21 1500       [facility] "PREP" Location   [facility] "PREP" Location [facility]      Weekly Session   Topic Discussed Importance of resistance training;Other ways to be active   Water: 1/2 body wt in oz or 64 oz/day (unless on fluid restriction); work up to 150 min cardio/wk, strength training 2-3 x/wk 20-40 minutes   Minutes exercised this week 70 minutes    Classes attended to date 3           Has tendonitis R arm, plans to attend classes on Mondays, will hold off on strength training until seen by MD and R arm evaluated; encouraged to walk  [place] 09/13/2021, 3:24 PM

## 2021-09-16 DIAGNOSIS — N183 Chronic kidney disease, stage 3 unspecified: Secondary | ICD-10-CM | POA: Diagnosis not present

## 2021-09-16 DIAGNOSIS — M25511 Pain in right shoulder: Secondary | ICD-10-CM | POA: Diagnosis not present

## 2021-09-16 DIAGNOSIS — M545 Low back pain, unspecified: Secondary | ICD-10-CM | POA: Diagnosis not present

## 2021-09-16 DIAGNOSIS — E119 Type 2 diabetes mellitus without complications: Secondary | ICD-10-CM | POA: Diagnosis not present

## 2021-09-16 DIAGNOSIS — I10 Essential (primary) hypertension: Secondary | ICD-10-CM | POA: Diagnosis not present

## 2021-09-16 DIAGNOSIS — E785 Hyperlipidemia, unspecified: Secondary | ICD-10-CM | POA: Diagnosis not present

## 2021-09-21 NOTE — Progress Notes (Signed)
[facility] PREP Weekly Session  Patient Details  Name: Jamie Benjamin MRN: [id number] Date of Birth: 08/22/1945 Age: 76 y.o. PCP: Maurice Small, MD  Vitals:   09/21/21 2440  Weight: 168 lb (76.2 kg)     [facility] Weekly seesion - 09/21/21 0900       [facility] "PREP" Location   [facility] "PREP" Location [facility]      Weekly Session   Topic Discussed Healthy eating tips    Minutes exercised this week 105 minutes    Classes attended to date 4           Awaiting visit to pain clinic for tendonitis in arm, encourage to walk when able, but no strength training until seen by clinician.  Yevonne Aline 09/21/2021, 9:27 AM

## 2021-09-27 DIAGNOSIS — M25511 Pain in right shoulder: Secondary | ICD-10-CM | POA: Diagnosis not present

## 2021-09-30 ENCOUNTER — Other Ambulatory Visit: Payer: Self-pay | Admitting: Sports Medicine

## 2021-09-30 ENCOUNTER — Ambulatory Visit
Admission: RE | Admit: 2021-09-30 | Discharge: 2021-09-30 | Disposition: A | Discharge status: Home / Self Care | Payer: Medicare Other | Source: Ambulatory Visit | Attending: Sports Medicine | Admitting: Sports Medicine

## 2021-09-30 DIAGNOSIS — M25511 Pain in right shoulder: Secondary | ICD-10-CM | POA: Diagnosis not present

## 2021-10-04 ENCOUNTER — Other Ambulatory Visit: Payer: Self-pay | Admitting: Cardiovascular Disease

## 2021-10-04 DIAGNOSIS — E119 Type 2 diabetes mellitus without complications: Secondary | ICD-10-CM | POA: Diagnosis not present

## 2021-10-04 DIAGNOSIS — H2513 Age-related nuclear cataract, bilateral: Secondary | ICD-10-CM | POA: Diagnosis not present

## 2021-10-04 DIAGNOSIS — H25013 Cortical age-related cataract, bilateral: Secondary | ICD-10-CM | POA: Diagnosis not present

## 2021-10-04 DIAGNOSIS — H5203 Hypermetropia, bilateral: Secondary | ICD-10-CM | POA: Diagnosis not present

## 2021-10-04 DIAGNOSIS — H52223 Regular astigmatism, bilateral: Secondary | ICD-10-CM | POA: Diagnosis not present

## 2021-11-30 DIAGNOSIS — N1832 Chronic kidney disease, stage 3b: Secondary | ICD-10-CM | POA: Diagnosis not present

## 2021-12-06 DIAGNOSIS — N390 Urinary tract infection, site not specified: Secondary | ICD-10-CM | POA: Diagnosis not present

## 2021-12-06 DIAGNOSIS — N281 Cyst of kidney, acquired: Secondary | ICD-10-CM | POA: Diagnosis not present

## 2021-12-06 DIAGNOSIS — N1832 Chronic kidney disease, stage 3b: Secondary | ICD-10-CM | POA: Diagnosis not present

## 2021-12-06 DIAGNOSIS — N2581 Secondary hyperparathyroidism of renal origin: Secondary | ICD-10-CM | POA: Diagnosis not present

## 2021-12-06 DIAGNOSIS — D631 Anemia in chronic kidney disease: Secondary | ICD-10-CM | POA: Diagnosis not present

## 2021-12-06 DIAGNOSIS — I129 Hypertensive chronic kidney disease with stage 1 through stage 4 chronic kidney disease, or unspecified chronic kidney disease: Secondary | ICD-10-CM | POA: Diagnosis not present

## 2021-12-06 DIAGNOSIS — E785 Hyperlipidemia, unspecified: Secondary | ICD-10-CM | POA: Diagnosis not present

## 2021-12-07 DIAGNOSIS — H18413 Arcus senilis, bilateral: Secondary | ICD-10-CM | POA: Diagnosis not present

## 2021-12-07 DIAGNOSIS — H2513 Age-related nuclear cataract, bilateral: Secondary | ICD-10-CM | POA: Diagnosis not present

## 2021-12-07 DIAGNOSIS — H25013 Cortical age-related cataract, bilateral: Secondary | ICD-10-CM | POA: Diagnosis not present

## 2021-12-07 DIAGNOSIS — H2512 Age-related nuclear cataract, left eye: Secondary | ICD-10-CM | POA: Diagnosis not present

## 2021-12-07 DIAGNOSIS — H25043 Posterior subcapsular polar age-related cataract, bilateral: Secondary | ICD-10-CM | POA: Diagnosis not present

## 2021-12-09 DIAGNOSIS — E785 Hyperlipidemia, unspecified: Secondary | ICD-10-CM | POA: Diagnosis not present

## 2021-12-09 DIAGNOSIS — E1122 Type 2 diabetes mellitus with diabetic chronic kidney disease: Secondary | ICD-10-CM | POA: Diagnosis not present

## 2021-12-09 DIAGNOSIS — I700 Atherosclerosis of aorta: Secondary | ICD-10-CM | POA: Diagnosis not present

## 2021-12-09 DIAGNOSIS — N183 Chronic kidney disease, stage 3 unspecified: Secondary | ICD-10-CM | POA: Diagnosis not present

## 2021-12-14 DIAGNOSIS — E119 Type 2 diabetes mellitus without complications: Secondary | ICD-10-CM | POA: Diagnosis not present

## 2021-12-23 DIAGNOSIS — R809 Proteinuria, unspecified: Secondary | ICD-10-CM | POA: Diagnosis not present

## 2022-01-07 ENCOUNTER — Telehealth: Payer: Self-pay | Admitting: Pharmacist Clinician (PhC)/ Clinical Pharmacy Specialist

## 2022-01-07 NOTE — Telephone Encounter (Signed)
Patient called into office - concerned about whether she would need antibiotic prophylaxis for dental work now that she has coronary stents.  Assured her that this is not necessary based on the current guideline.  Patient voiced understanding.

## 2022-02-11 ENCOUNTER — Telehealth: Payer: Self-pay | Admitting: *Deleted

## 2022-02-11 NOTE — Telephone Encounter (Signed)
   Patient Name: Jamie Benjamin  DOB: 1945-08-30 MRN: [id number]  Primary Cardiologist: None  Chart reviewed as part of pre-operative protocol coverage. Cataract extractions are recognized in guidelines as low risk surgeries that do not typically require specific preoperative testing or holding of blood thinner therapy. Therefore, given past medical history and time since last visit, based on ACC/AHA guidelines, [name] would be at acceptable risk for the planned procedure without further cardiovascular testing.   I will route this recommendation to the requesting party via Epic fax function and remove from pre-op pool.  Please call with questions.  Elgie Collard, PA-C 02/11/2022, 11:54 AM

## 2022-02-11 NOTE — Telephone Encounter (Signed)
   Pre-operative Risk Assessment    Patient Name: Jamie Benjamin  DOB: 03/11/1946 MRN: [id number]      Request for Surgical Clearance    Procedure:  CATARACT EXTRACTION WITH INTRAOCULAR LENS IMPLANT OF THE LEFT EYE TO THEN BE FOLLOWED BY THE RIGHT EYE.   Date of Surgery:  Clearance 03/14/22                                 Surgeon:  DR. Darleen Crocker Surgeon's Group or Practice Name:  [facility] Phone number:  [phone] Fax number:  [phone]   Type of Clearance Requested:   - Medical ; PER CLEARANCE REQUEST PT DOES NOT NEED TO STOP ANY MEDICATIONS, INCLUDING BLOOD THINNERS   Type of Anesthesia:   TOPICAL WITH IV MEDICATION   Additional requests/questions:    [place], Kam[place]   02/11/2022, 11:27 AM

## 2022-02-17 DIAGNOSIS — E1122 Type 2 diabetes mellitus with diabetic chronic kidney disease: Secondary | ICD-10-CM | POA: Diagnosis not present

## 2022-02-17 DIAGNOSIS — S8010XA Contusion of unspecified lower leg, initial encounter: Secondary | ICD-10-CM | POA: Diagnosis not present

## 2022-02-17 DIAGNOSIS — I10 Essential (primary) hypertension: Secondary | ICD-10-CM | POA: Diagnosis not present

## 2022-03-10 ENCOUNTER — Other Ambulatory Visit: Payer: Self-pay | Admitting: Cardiovascular Disease

## 2022-03-14 DIAGNOSIS — H2512 Age-related nuclear cataract, left eye: Secondary | ICD-10-CM | POA: Diagnosis not present

## 2022-03-15 DIAGNOSIS — H2511 Age-related nuclear cataract, right eye: Secondary | ICD-10-CM | POA: Diagnosis not present

## 2022-03-28 ENCOUNTER — Other Ambulatory Visit: Payer: Self-pay | Admitting: Cardiovascular Disease

## 2022-03-28 DIAGNOSIS — H2511 Age-related nuclear cataract, right eye: Secondary | ICD-10-CM | POA: Diagnosis not present

## 2022-05-04 ENCOUNTER — Encounter: Payer: Self-pay | Admitting: Cardiovascular Disease

## 2022-05-04 ENCOUNTER — Ambulatory Visit: Payer: Medicare Other | Attending: Cardiovascular Disease | Admitting: Cardiovascular Disease

## 2022-05-04 DIAGNOSIS — I2510 Atherosclerotic heart disease of native coronary artery without angina pectoris: Secondary | ICD-10-CM | POA: Diagnosis not present

## 2022-05-04 DIAGNOSIS — E785 Hyperlipidemia, unspecified: Secondary | ICD-10-CM | POA: Diagnosis not present

## 2022-05-04 DIAGNOSIS — Z9861 Coronary angioplasty status: Secondary | ICD-10-CM | POA: Insufficient documentation

## 2022-05-04 DIAGNOSIS — I10 Essential (primary) hypertension: Secondary | ICD-10-CM | POA: Diagnosis not present

## 2022-05-04 NOTE — Assessment & Plan Note (Signed)
History of essential hypertension a blood pressure measured today at 122/64.  She is on amlodipine, and metoprolol.

## 2022-05-04 NOTE — Progress Notes (Signed)
05/04/2022 STACHIA SLUTSKY   Oct 09, 1945  [id number]  Primary Physician Maurice Small, MD Primary Cardiologist: Lorretta Harp MD Lupe Carney, [place]  HPI:  Jamie Benjamin is a 76 y.o.  moderately overweight widowed Caucasian female mother of 2, grandmother of 5 grandchildren who is accompanied by her son Scot Jun who is an ER physician at [employer].  I last saw her in the office 10/06/2021.  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  She basically had no cardiac risk factors other now she she is treated for essential hypertension hyperlipidemia.  She had a non-STEMI 09/16/2020 with troponins went up to 95 and diagnostic coronary artery angiography, PCI and drug-eluting stenting of the mid LAD by Dr. Martinique the following day.  She had no other CAD and preserved LV function.  She was on Brilinta initially which was transitioned to Plavix because of side effects.   She was seen in the ER for positional positional chest pain and shortness of breath.  She did have a small pericardial effusion and a left pleural effusion which improved over several days.  Symptoms were somewhat positional consistent with pleuropericarditis.  She did have exposure to a COVID-patient but tested negative.  It still not clear whether she did have COVID since she did have exposure to this.  Dr. Neena Rhymes was consulted and recommended colchicine which she has been on and since that time has had no recurrent chest pain.  I did tell her that she could discontinue colchicine after being on it for 3 months.  Since I saw her 7 months ago she continues to do well.  She did put back some weight.  She she denies chest pain or shortness of breath.     Current Meds  Medication Sig   amLODipine (NORVASC) 10 MG tablet Take 10 mg by mouth daily.   buPROPion (WELLBUTRIN XL) 300 MG 24 hr tablet Take 300 mg by mouth daily.   Cholecalciferol (VITAMIN D) 50 MCG (2000 UT) tablet Take  2,000 Units by mouth daily.   clopidogrel (PLAVIX) 75 MG tablet TAKE 8 TABLETS (600 MG) FOR FIRST DOSE, THEN TAKE 1 TABLET ONCE DAILY.   glipiZIDE (GLUCOTROL XL) 2.5 MG 24 hr tablet Take 2.5 mg by mouth daily with breakfast.   levothyroxine (SYNTHROID, LEVOTHROID) 50 MCG tablet Take 50 mcg by mouth daily before breakfast.   metoprolol tartrate (LOPRESSOR) 100 MG tablet Take 100 mg by mouth 2 (two) times daily.   montelukast (SINGULAIR) 10 MG tablet Take 10 mg by mouth daily.   PRALUENT 150 MG/ML SOAJ INJECT 1 PEN INTO THE SKIN EVERY 14 (FOURTEEN) DAYS.     Allergies  Allergen Reactions   Influenza Virus Vaccine Other (See Comments)   Lisinopril Cough   Rosuvastatin     myalgia    Social History   Socioeconomic History   Marital status: Widowed    Spouse name: Not on file   Number of children: Not on file   Years of education: Not on file   Highest education level: Not on file  Occupational History   Occupation: Retired Therapist, sports  Tobacco Use   Smoking status: Never   Smokeless tobacco: Never  Vaping Use   Vaping Use: Never used  Substance and Sexual Activity   Alcohol use: No   Drug use: No   Sexual activity: Not on file  Other Topics Concern   Not on file  Social History Narrative   Not on file   Social Determinants of Health   Financial Resource Strain: Not on file  Food Insecurity: Not on file  Transportation Needs: Not on file  Physical Activity: Not on file  Stress: Not on file  Social Connections: Not on file  Intimate Partner Violence: Not on file     Review of Systems: General: negative for chills, fever, night sweats or weight changes.  Cardiovascular: negative for chest pain, dyspnea on exertion, edema, orthopnea, palpitations, paroxysmal nocturnal dyspnea or shortness of breath Dermatological: negative for rash Respiratory: negative for cough or wheezing Urologic: negative for hematuria Abdominal: negative for nausea, vomiting, diarrhea, bright red blood  per rectum, melena, or hematemesis Neurologic: negative for visual changes, syncope, or dizziness All other systems reviewed and are otherwise negative except as noted above.    Blood pressure 122/64, pulse 71, height '4\' 11"'$  (1.499 m), weight 166 lb (75.3 kg).  General appearance: alert and no distress Neck: no adenopathy, no carotid bruit, no JVD, supple, symmetrical, trachea midline, and thyroid not enlarged, symmetric, no tenderness/mass/nodules Lungs: clear to auscultation bilaterally Heart: regular rate and rhythm, S1, S2 normal, no murmur, click, rub or gallop Extremities: extremities normal, atraumatic, no cyanosis or edema Pulses: 2+ and symmetric Skin: Skin color, texture, turgor normal. No rashes or lesions Neurologic: Grossly normal  EKG sinus rhythm at 71 with borderline voltage criteria for LVH and septal Q waves.  I personally reviewed this EKG.  ASSESSMENT AND PLAN:   Dyslipidemia, goal LDL below 70 History of dyslipidemia on pravastatin and Praluent with lipid profile performed 09/16/2021 revealing total cholesterol of 110, LDL 32 and HDL 51.  Hypertension History of essential hypertension a blood pressure measured today at 122/64.  She is on amlodipine, and metoprolol.  CAD S/P percutaneous coronary angioplasty History of CAD status post non-STEMI 09/16/2020 with cardiac cath performed by Dr. Martinique.  She had PCI and drug-eluting stenting of the mid LAD.  There is no other significant CAD.  She had preserved LV function.  She currently is on Plavix as monotherapy.  She denies chest pain.     Lorretta Harp MD FACP,FACC,FAHA, [facility] 05/04/2022 3:06 PM

## 2022-05-04 NOTE — Assessment & Plan Note (Signed)
History of dyslipidemia on pravastatin and Praluent with lipid profile performed 09/16/2021 revealing total cholesterol of 110, LDL 32 and HDL 51.

## 2022-05-04 NOTE — Assessment & Plan Note (Signed)
History of CAD status post non-STEMI 09/16/2020 with cardiac cath performed by Dr. Martinique.  She had PCI and drug-eluting stenting of the mid LAD.  There is no other significant CAD.  She had preserved LV function.  She currently is on Plavix as monotherapy.  She denies chest pain.

## 2022-05-04 NOTE — Patient Instructions (Signed)
Medication Instructions:  Your physician recommends that you continue on your current medications as directed. Please refer to the Current Medication list given to you today.  *If you need a refill on your cardiac medications before your next appointment, please call your pharmacy*   Follow-Up: At [facility], you and your health needs are our priority.  As part of our continuing mission to provide you with exceptional heart care, we have created designated Provider Care Teams.  These Care Teams include your primary Cardiologist (physician) and Advanced Practice Providers (APPs -  Physician Assistants and Nurse Practitioners) who all work together to provide you with the care you need, when you need it.  We recommend signing up for the patient portal called "MyChart".  Sign up information is provided on this After Visit Summary.  MyChart is used to connect with patients for Virtual Visits (Telemedicine).  Patients are able to view lab/test results, encounter notes, upcoming appointments, etc.  Non-urgent messages can be sent to your provider as well.   To learn more about what you can do with MyChart, go to [email or web address].    Your next appointment:   12 month(s)  The format for your next appointment:   In Person  Provider:   Jonathan Berry, MD   

## 2022-05-20 ENCOUNTER — Other Ambulatory Visit: Payer: Self-pay | Admitting: Cardiovascular Disease

## 2022-05-26 DIAGNOSIS — D1801 Hemangioma of skin and subcutaneous tissue: Secondary | ICD-10-CM | POA: Diagnosis not present

## 2022-05-26 DIAGNOSIS — Z85828 Personal history of other malignant neoplasm of skin: Secondary | ICD-10-CM | POA: Diagnosis not present

## 2022-05-26 DIAGNOSIS — L820 Inflamed seborrheic keratosis: Secondary | ICD-10-CM | POA: Diagnosis not present

## 2022-05-26 DIAGNOSIS — L821 Other seborrheic keratosis: Secondary | ICD-10-CM | POA: Diagnosis not present

## 2022-05-26 DIAGNOSIS — L814 Other melanin hyperpigmentation: Secondary | ICD-10-CM | POA: Diagnosis not present

## 2022-05-26 DIAGNOSIS — L570 Actinic keratosis: Secondary | ICD-10-CM | POA: Diagnosis not present

## 2022-05-26 DIAGNOSIS — L718 Other rosacea: Secondary | ICD-10-CM | POA: Diagnosis not present

## 2022-05-26 DIAGNOSIS — D2261 Melanocytic nevi of right upper limb, including shoulder: Secondary | ICD-10-CM | POA: Diagnosis not present

## 2022-05-26 DIAGNOSIS — D225 Melanocytic nevi of trunk: Secondary | ICD-10-CM | POA: Diagnosis not present

## 2022-06-15 DIAGNOSIS — N1832 Chronic kidney disease, stage 3b: Secondary | ICD-10-CM | POA: Diagnosis not present

## 2022-06-17 DIAGNOSIS — N1832 Chronic kidney disease, stage 3b: Secondary | ICD-10-CM | POA: Diagnosis not present

## 2022-06-22 DIAGNOSIS — D631 Anemia in chronic kidney disease: Secondary | ICD-10-CM | POA: Diagnosis not present

## 2022-06-22 DIAGNOSIS — R809 Proteinuria, unspecified: Secondary | ICD-10-CM | POA: Diagnosis not present

## 2022-06-22 DIAGNOSIS — I129 Hypertensive chronic kidney disease with stage 1 through stage 4 chronic kidney disease, or unspecified chronic kidney disease: Secondary | ICD-10-CM | POA: Diagnosis not present

## 2022-06-22 DIAGNOSIS — E1122 Type 2 diabetes mellitus with diabetic chronic kidney disease: Secondary | ICD-10-CM | POA: Diagnosis not present

## 2022-06-22 DIAGNOSIS — N1832 Chronic kidney disease, stage 3b: Secondary | ICD-10-CM | POA: Diagnosis not present

## 2022-06-22 DIAGNOSIS — N2581 Secondary hyperparathyroidism of renal origin: Secondary | ICD-10-CM | POA: Diagnosis not present

## 2022-07-08 ENCOUNTER — Telehealth: Payer: Self-pay | Admitting: Cardiovascular Disease

## 2022-07-08 NOTE — Telephone Encounter (Signed)
Will submit prior auth for Repatha in January once her formulary changes. Pt is aware and was appreciative for the call back.

## 2022-07-08 NOTE — Telephone Encounter (Signed)
Patient called stating she received a letter saying as of January her insurance will no longer cover Praluent.  They said they will cover Repatha.

## 2022-07-10 ENCOUNTER — Other Ambulatory Visit: Payer: Self-pay | Admitting: Cardiovascular Disease

## 2022-07-20 IMAGING — CT CT ANGIO CHEST-ABD-PELV FOR DISSECTION W/ AND WO/W CM
2 of 7 series · 15 of 46 positions shown, 17 images · IV contrast (OMNI 350)
Comparison: None.

CLINICAL DATA: Chest or back pain with aortic dissection suspected

EXAM:
CT ANGIOGRAPHY CHEST, ABDOMEN AND PELVIS
TECHNIQUE: Non-contrast CT of the chest was initially obtained.

[Series 7: dissection 2mm · axial · 0.82mm/px · z∈[+785,+1279]mm · 12 of 277 slices shown, 14 images]
[im 15/277  soft-tissue]
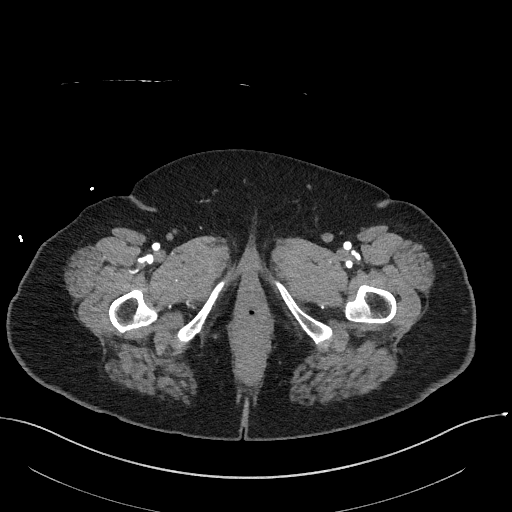
[im 15/277  bone]
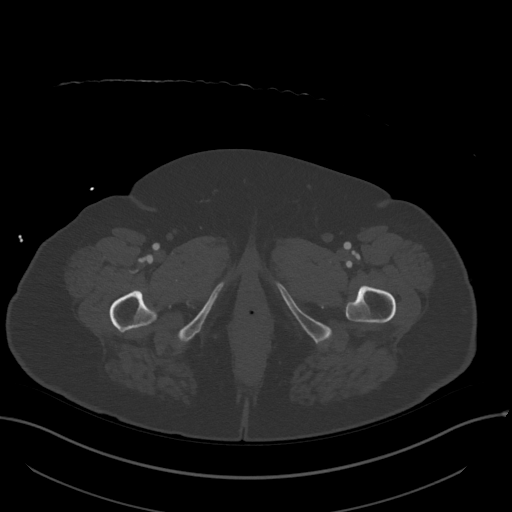
[im 44/277  soft-tissue]
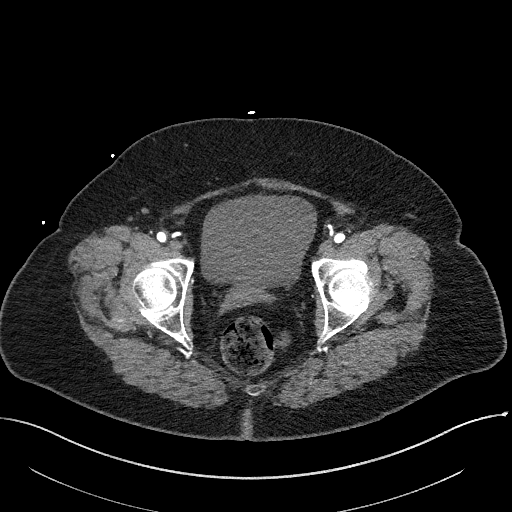
[im 59/277  soft-tissue]
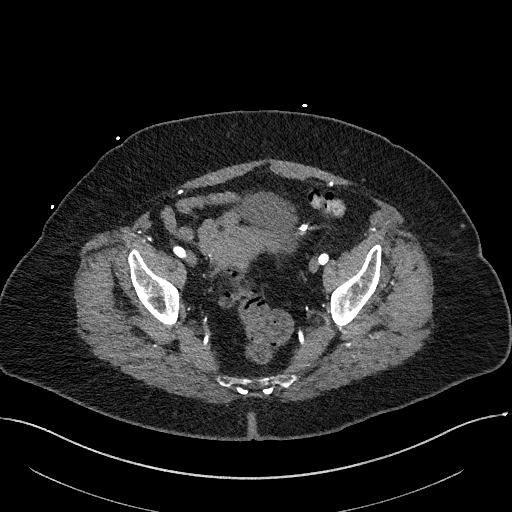
[im 88/277  soft-tissue]
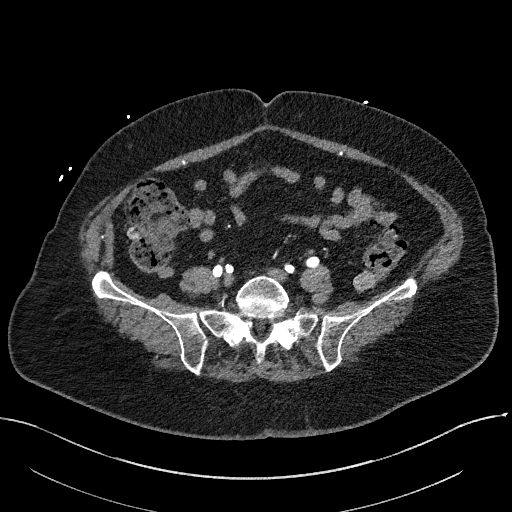
[im 102/277  soft-tissue]
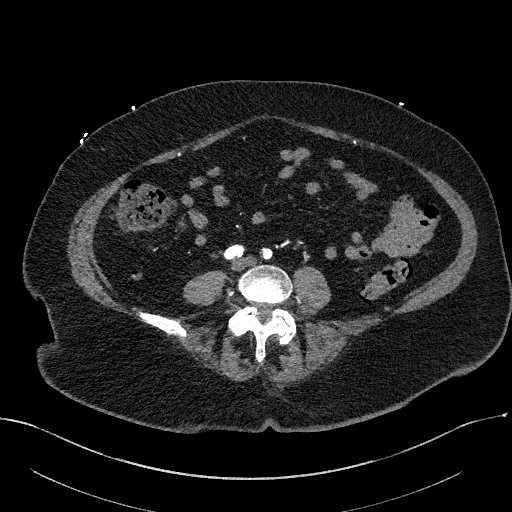
[im 131/277  soft-tissue]
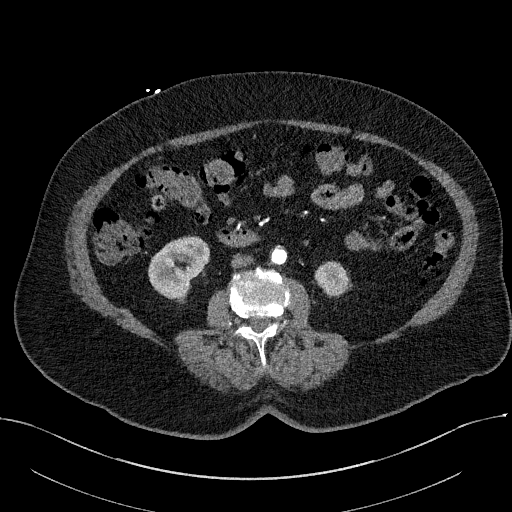
[im 146/277  soft-tissue]
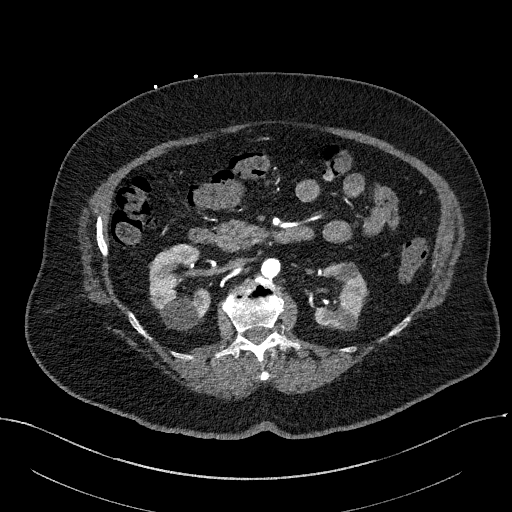
[im 175/277  soft-tissue]
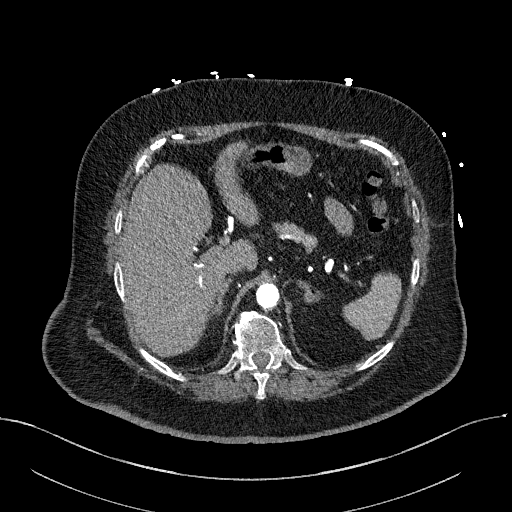
[im 189/277  soft-tissue]
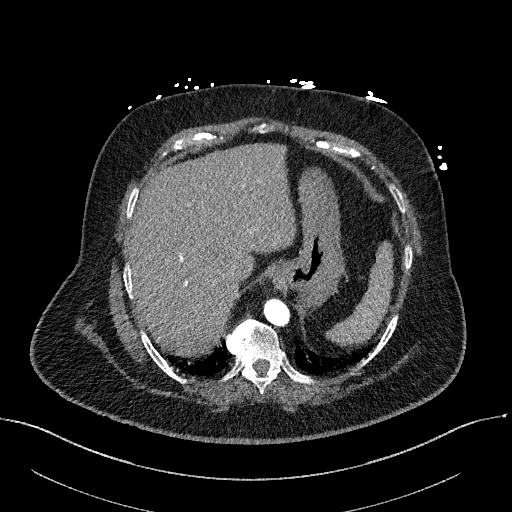
[im 189/277  bone]
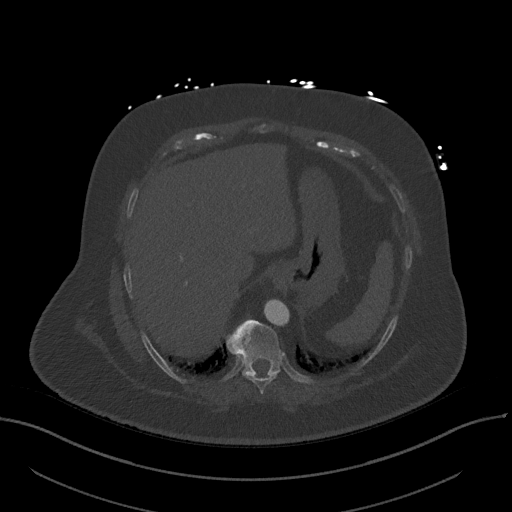
[im 218/277  soft-tissue]
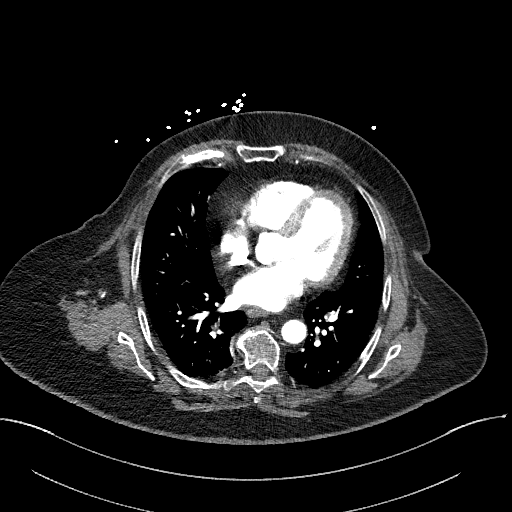
[im 233/277  soft-tissue]
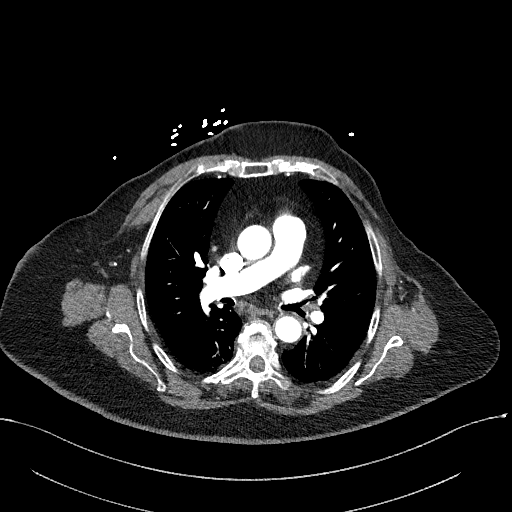
[im 262/277  soft-tissue]
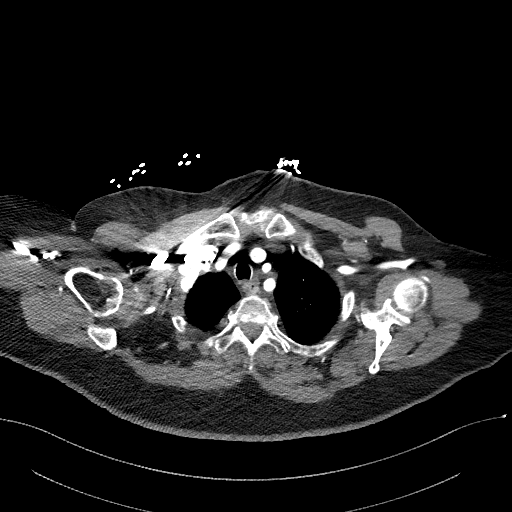

[Series 10: dissection 2mm cor · coronal · 0.78mm/px · 3 of 151 slices shown]
[im 38/151  soft-tissue]
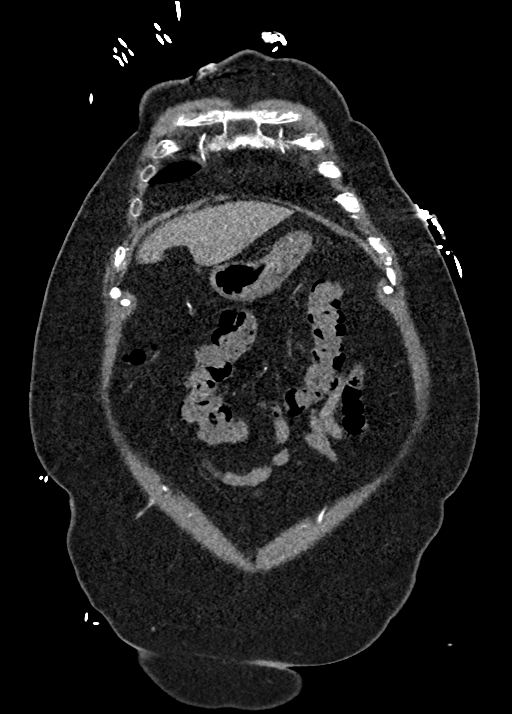
[im 76/151  soft-tissue]
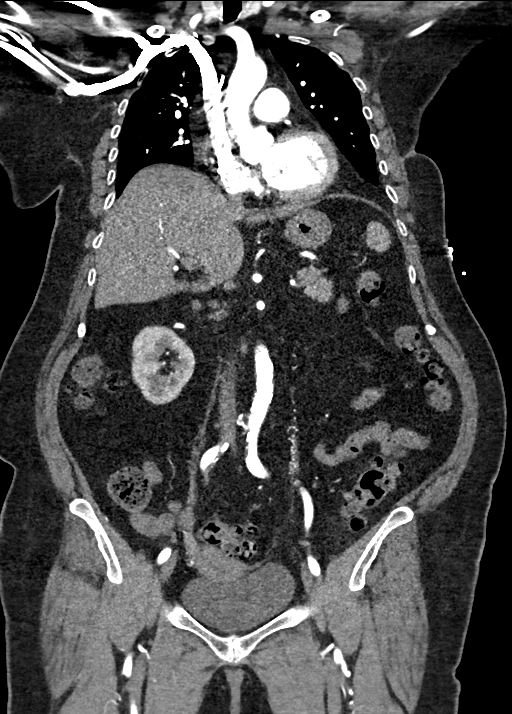
[im 113/151  soft-tissue]
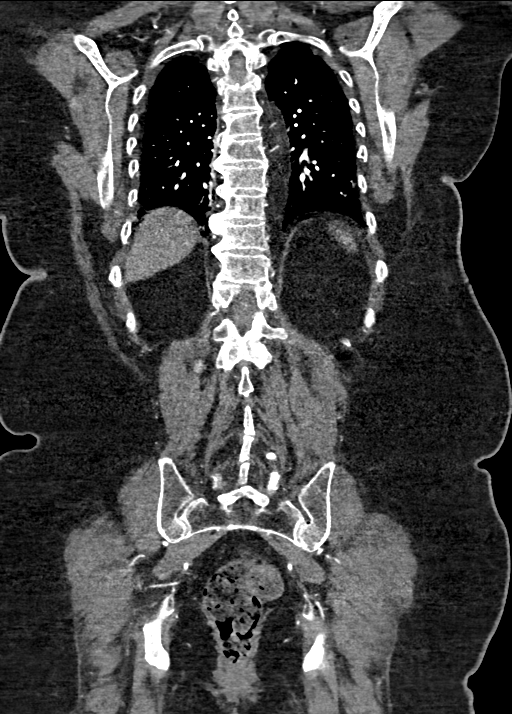

[15 of 46 positions shown; findings below may reference images not displayed]

Multidetector CT imaging through the chest, abdomen and pelvis was
performed using the standard protocol during bolus administration of
intravenous contrast. Multiplanar reconstructed images and MIPs were
obtained and reviewed to evaluate the vascular anatomy.

CONTRAST:  80mL OMNIPAQUE IOHEXOL 350 MG/ML SOLN
FINDINGS: CTA CHEST FINDINGS

Cardiovascular: Normal heart size. No pericardial effusion. No
intramural aortic hematoma. Aortic and coronary atheromatous
calcification. Negative for aortic dissection or aneurysm. Well
opacified pulmonary arteries that are negative for filling defect

Mediastinum/Nodes: Negative for adenopathy, mass, or hematoma.

Lungs/Pleura: Areas of bronchial airway collapse/thickening. There
is no edema, consolidation, effusion, or pneumothorax.

Musculoskeletal: Spondylosis with multilevel thoracic ankylosis from
bridging osteophyte. Remote T12 compression fracture with mild
height loss.

Review of the MIP images confirms the above findings.

CTA ABDOMEN AND PELVIS FINDINGS

VASCULAR

Aorta: Atheromatous plaque diffusely.  No aneurysm or dissection.

Celiac: Patent without evidence of aneurysm, dissection, vasculitis
or significant stenosis.

SMA: Patent without evidence of aneurysm, dissection, vasculitis or
significant stenosis.

Renals: Plaque at the ostia without stenosis, beading, or
dissection. Negative for aneurysm.

IMA: Patent

Inflow: Atheromatous plaque of the iliac arteries. No stenosis or
dissection.

Veins: Negative

Review of the MIP images confirms the above findings.

NON-VASCULAR

Hepatobiliary: Low-density of the liver may be related to contrast
timing. Negative for visible liver lesion.No evidence of biliary
obstruction or stone.

Pancreas: Unremarkable.

Spleen: Unremarkable.

Adrenals/Urinary Tract: Negative adrenals. No hydronephrosis or
stone. Bilateral renal cysts with simple CT appearance. Unremarkable
bladder.

Stomach/Bowel: No obstruction. No visible bowel inflammation.
Innumerable colonic diverticula.

Lymphatic: No mass or adenopathy.

Reproductive:No pathologic findings.

Other: No ascites or pneumoperitoneum.

Musculoskeletal: Lumbar spine degeneration with L4-5
anterolisthesis. No acute finding.

Review of the MIP images confirms the above findings.
IMPRESSION: 1. Negative for acute aortic syndrome or other specific cause of
symptoms.
2.  Aortic Atherosclerosis ([id number]-[id number]).  Coronary atherosclerosis.
3. Bronchomalacia without collapse or consolidation.
4. Extensive colonic diverticulosis.

## 2022-07-21 DIAGNOSIS — N1832 Chronic kidney disease, stage 3b: Secondary | ICD-10-CM | POA: Diagnosis not present

## 2022-08-02 ENCOUNTER — Telehealth: Payer: Self-pay | Admitting: Pharmacist

## 2022-08-02 MED ORDER — REPATHA SURECLICK 140 MG/ML ~~LOC~~ SOAJ: 1.0000 | SUBCUTANEOUS | 3 refills | Status: DC

## 2022-08-02 NOTE — Telephone Encounter (Signed)
Pt's formulary changing from [place] to [place] for 2024. Prior auth submitted for Repatha, Key: [id number]. Approved through 08/02/23, pt is aware.

## 2022-08-05 DIAGNOSIS — E785 Hyperlipidemia, unspecified: Secondary | ICD-10-CM | POA: Diagnosis not present

## 2022-08-05 DIAGNOSIS — E039 Hypothyroidism, unspecified: Secondary | ICD-10-CM | POA: Diagnosis not present

## 2022-08-05 DIAGNOSIS — E1122 Type 2 diabetes mellitus with diabetic chronic kidney disease: Secondary | ICD-10-CM | POA: Diagnosis not present

## 2022-08-05 DIAGNOSIS — E119 Type 2 diabetes mellitus without complications: Secondary | ICD-10-CM | POA: Diagnosis not present

## 2022-08-05 DIAGNOSIS — D582 Other hemoglobinopathies: Secondary | ICD-10-CM | POA: Diagnosis not present

## 2022-08-05 LAB — COMPREHENSIVE METABOLIC PANEL: EGFR: 37

## 2022-08-05 LAB — HEMOGLOBIN A1C: A1c: 6.1

## 2022-08-09 DIAGNOSIS — E039 Hypothyroidism, unspecified: Secondary | ICD-10-CM | POA: Diagnosis not present

## 2022-08-09 DIAGNOSIS — Z0000 Encounter for general adult medical examination without abnormal findings: Secondary | ICD-10-CM | POA: Diagnosis not present

## 2022-08-09 DIAGNOSIS — Z23 Encounter for immunization: Secondary | ICD-10-CM | POA: Diagnosis not present

## 2022-08-09 DIAGNOSIS — M159 Polyosteoarthritis, unspecified: Secondary | ICD-10-CM | POA: Diagnosis not present

## 2022-08-09 DIAGNOSIS — Z9013 Acquired absence of bilateral breasts and nipples: Secondary | ICD-10-CM | POA: Diagnosis not present

## 2022-08-09 DIAGNOSIS — E785 Hyperlipidemia, unspecified: Secondary | ICD-10-CM | POA: Diagnosis not present

## 2022-08-09 DIAGNOSIS — E1122 Type 2 diabetes mellitus with diabetic chronic kidney disease: Secondary | ICD-10-CM | POA: Diagnosis not present

## 2022-08-16 DIAGNOSIS — Z85828 Personal history of other malignant neoplasm of skin: Secondary | ICD-10-CM | POA: Diagnosis not present

## 2022-08-16 DIAGNOSIS — L820 Inflamed seborrheic keratosis: Secondary | ICD-10-CM | POA: Diagnosis not present

## 2022-08-16 DIAGNOSIS — L821 Other seborrheic keratosis: Secondary | ICD-10-CM | POA: Diagnosis not present

## 2022-08-25 DIAGNOSIS — N1832 Chronic kidney disease, stage 3b: Secondary | ICD-10-CM | POA: Diagnosis not present

## 2022-09-14 ENCOUNTER — Other Ambulatory Visit: Payer: Self-pay | Admitting: Cardiovascular Disease

## 2022-09-29 DIAGNOSIS — N1832 Chronic kidney disease, stage 3b: Secondary | ICD-10-CM | POA: Diagnosis not present

## 2022-12-06 DIAGNOSIS — N1832 Chronic kidney disease, stage 3b: Secondary | ICD-10-CM | POA: Diagnosis not present

## 2022-12-13 DIAGNOSIS — N1832 Chronic kidney disease, stage 3b: Secondary | ICD-10-CM | POA: Diagnosis not present

## 2022-12-13 DIAGNOSIS — D631 Anemia in chronic kidney disease: Secondary | ICD-10-CM | POA: Diagnosis not present

## 2022-12-13 DIAGNOSIS — I129 Hypertensive chronic kidney disease with stage 1 through stage 4 chronic kidney disease, or unspecified chronic kidney disease: Secondary | ICD-10-CM | POA: Diagnosis not present

## 2022-12-13 DIAGNOSIS — N2581 Secondary hyperparathyroidism of renal origin: Secondary | ICD-10-CM | POA: Diagnosis not present

## 2022-12-13 DIAGNOSIS — R809 Proteinuria, unspecified: Secondary | ICD-10-CM | POA: Diagnosis not present

## 2022-12-13 DIAGNOSIS — E1122 Type 2 diabetes mellitus with diabetic chronic kidney disease: Secondary | ICD-10-CM | POA: Diagnosis not present

## 2022-12-14 DIAGNOSIS — E7800 Pure hypercholesterolemia, unspecified: Secondary | ICD-10-CM | POA: Diagnosis not present

## 2022-12-14 DIAGNOSIS — E785 Hyperlipidemia, unspecified: Secondary | ICD-10-CM | POA: Diagnosis not present

## 2022-12-14 DIAGNOSIS — I700 Atherosclerosis of aorta: Secondary | ICD-10-CM | POA: Diagnosis not present

## 2022-12-14 DIAGNOSIS — E1122 Type 2 diabetes mellitus with diabetic chronic kidney disease: Secondary | ICD-10-CM | POA: Diagnosis not present

## 2022-12-14 DIAGNOSIS — N1831 Chronic kidney disease, stage 3a: Secondary | ICD-10-CM | POA: Diagnosis not present

## 2022-12-21 DIAGNOSIS — L570 Actinic keratosis: Secondary | ICD-10-CM | POA: Diagnosis not present

## 2022-12-21 DIAGNOSIS — Z85828 Personal history of other malignant neoplasm of skin: Secondary | ICD-10-CM | POA: Diagnosis not present

## 2023-01-06 ENCOUNTER — Telehealth: Payer: Self-pay | Admitting: Cardiovascular Disease

## 2023-01-06 DIAGNOSIS — E785 Hyperlipidemia, unspecified: Secondary | ICD-10-CM

## 2023-01-06 MED ORDER — REPATHA SURECLICK 140 MG/ML ~~LOC~~ SOAJ: 140.0000 mg | SUBCUTANEOUS | 0 refills | Status: DC

## 2023-01-06 MED ORDER — PRAVASTATIN SODIUM 20 MG PO TABS: 20.0000 mg | ORAL_TABLET | Freq: Every evening | ORAL | 3 refills | Status: DC

## 2023-01-06 NOTE — Telephone Encounter (Signed)
Pt c/o medication issue:  1. Name of Medication:   Evolocumab (REPATHA SURECLICK) 140 MG/ML SOAJ    2. How are you currently taking this medication (dosage and times per day)? As written   3. Are you having a reaction (difficulty breathing--STAT)? no  4. What is your medication issue? Pt states she has a faulty pen and the pharmacy is trying to fix but they need verification from the provider first that she is on it. She is asking to speak to pharmacist.

## 2023-01-06 NOTE — Telephone Encounter (Signed)
Returned call to pt. Rx e-scribed to [facility] pharmacy for single replacement Repatha pen.  Also asked pt what dose of pravastatin she's on because her med list has both 20mg  and 40mg . She states she's taking 20mg  but cutting the 40mg  tablets in half. Asked if she would like rx sent in for 20mg  dose so she no longer has to cut, she stated she would be very grateful for that. New rx sent in and med list cleaned up.  Pt would also like her cholesterol rechecked since insurance made her change from Praluent to Repatha at the beginning of the year, to ensure that Repatha is working just as well. I have placed lab order for her to have fasting cholesterol checked at [facility] office at her convenience. She was appreciative for the assistance.

## 2023-01-17 DIAGNOSIS — E785 Hyperlipidemia, unspecified: Secondary | ICD-10-CM | POA: Diagnosis not present

## 2023-01-18 LAB — LIPID PANEL
Chol/HDL Ratio: 1.9 ratio (ref 0.0–4.4)
Cholesterol, Total: 97 mg/dL — ABNORMAL LOW (ref 100–199)
HDL: 52 mg/dL (ref >39)
LDL Chol Calc (NIH): 25 mg/dL (ref 0–99)
Triglycerides: 112 mg/dL (ref 0–149)
VLDL Cholesterol Cal: 20 mg/dL (ref 5–40)

## 2023-02-13 DIAGNOSIS — E1122 Type 2 diabetes mellitus with diabetic chronic kidney disease: Secondary | ICD-10-CM | POA: Diagnosis not present

## 2023-02-13 DIAGNOSIS — K224 Dyskinesia of esophagus: Secondary | ICD-10-CM | POA: Diagnosis not present

## 2023-03-13 ENCOUNTER — Telehealth: Payer: Self-pay | Admitting: Cardiovascular Disease

## 2023-03-13 NOTE — Telephone Encounter (Signed)
Returned pt call in regards to two severe nose bleeds on Friday and took 3 1/2 hours to get it to stop also had clots coming out the "size of a fetus". Saturday she did not take the Plavix and did not have a nose bleed. Sunday she took the Plavix and had another bleed and it was moderate with small clots and lasted an hour and a half. She has not taken her Plavix today. She would like to have some advise on what to do. Her son is an ER doctor and he told her she needed to call us. Pt states she has not had a problem in all the years of taking Plavix until now.

## 2023-03-13 NOTE — Telephone Encounter (Signed)
Pt c/o medication issue:  1. Name of Medication: clopidogrel (PLAVIX) 75 MG tablet   2. How are you currently taking this medication (dosage and times per day)?  TAKE 8 TABLETS (600 MG) FOR FIRST DOSE, THEN TAKE 1 TABLET ONCE DAILY.       3. Are you having a reaction (difficulty breathing--STAT)? No  4. What is your medication issue? Pt is requesting a callback regarding this medication causing her to have severe nose bleeding twice over the weekend. She'd like to discuss further once called back. Please advise

## 2023-03-14 NOTE — Telephone Encounter (Signed)
Returned pt call. Per Dr. Allyson Sabal. Advised her that per Dr. Allyson Sabal wants her to stop taking her Plavix and start taking an 81mg  ASA. Pt verbalized understanding.

## 2023-03-30 DIAGNOSIS — L570 Actinic keratosis: Secondary | ICD-10-CM | POA: Diagnosis not present

## 2023-03-30 DIAGNOSIS — L718 Other rosacea: Secondary | ICD-10-CM | POA: Diagnosis not present

## 2023-03-30 DIAGNOSIS — Z85828 Personal history of other malignant neoplasm of skin: Secondary | ICD-10-CM | POA: Diagnosis not present

## 2023-03-30 DIAGNOSIS — L738 Other specified follicular disorders: Secondary | ICD-10-CM | POA: Diagnosis not present

## 2023-04-18 DIAGNOSIS — M10032 Idiopathic gout, left wrist: Secondary | ICD-10-CM | POA: Diagnosis not present

## 2023-04-18 DIAGNOSIS — I10 Essential (primary) hypertension: Secondary | ICD-10-CM | POA: Diagnosis not present

## 2023-04-18 DIAGNOSIS — E1122 Type 2 diabetes mellitus with diabetic chronic kidney disease: Secondary | ICD-10-CM | POA: Diagnosis not present

## 2023-06-08 DIAGNOSIS — N1832 Chronic kidney disease, stage 3b: Secondary | ICD-10-CM | POA: Diagnosis not present

## 2023-06-14 DIAGNOSIS — R809 Proteinuria, unspecified: Secondary | ICD-10-CM | POA: Diagnosis not present

## 2023-06-14 DIAGNOSIS — D631 Anemia in chronic kidney disease: Secondary | ICD-10-CM | POA: Diagnosis not present

## 2023-06-14 DIAGNOSIS — N1832 Chronic kidney disease, stage 3b: Secondary | ICD-10-CM | POA: Diagnosis not present

## 2023-06-14 DIAGNOSIS — I129 Hypertensive chronic kidney disease with stage 1 through stage 4 chronic kidney disease, or unspecified chronic kidney disease: Secondary | ICD-10-CM | POA: Diagnosis not present

## 2023-06-14 DIAGNOSIS — N2581 Secondary hyperparathyroidism of renal origin: Secondary | ICD-10-CM | POA: Diagnosis not present

## 2023-06-14 DIAGNOSIS — E1122 Type 2 diabetes mellitus with diabetic chronic kidney disease: Secondary | ICD-10-CM | POA: Diagnosis not present

## 2023-06-14 DIAGNOSIS — N189 Chronic kidney disease, unspecified: Secondary | ICD-10-CM | POA: Diagnosis not present

## 2023-06-23 DIAGNOSIS — E119 Type 2 diabetes mellitus without complications: Secondary | ICD-10-CM | POA: Diagnosis not present

## 2023-06-23 DIAGNOSIS — E1122 Type 2 diabetes mellitus with diabetic chronic kidney disease: Secondary | ICD-10-CM | POA: Diagnosis not present

## 2023-06-23 DIAGNOSIS — D582 Other hemoglobinopathies: Secondary | ICD-10-CM | POA: Diagnosis not present

## 2023-07-13 DIAGNOSIS — D2261 Melanocytic nevi of right upper limb, including shoulder: Secondary | ICD-10-CM | POA: Diagnosis not present

## 2023-07-13 DIAGNOSIS — L814 Other melanin hyperpigmentation: Secondary | ICD-10-CM | POA: Diagnosis not present

## 2023-07-13 DIAGNOSIS — D1801 Hemangioma of skin and subcutaneous tissue: Secondary | ICD-10-CM | POA: Diagnosis not present

## 2023-07-13 DIAGNOSIS — L821 Other seborrheic keratosis: Secondary | ICD-10-CM | POA: Diagnosis not present

## 2023-07-13 DIAGNOSIS — L570 Actinic keratosis: Secondary | ICD-10-CM | POA: Diagnosis not present

## 2023-07-13 DIAGNOSIS — Z85828 Personal history of other malignant neoplasm of skin: Secondary | ICD-10-CM | POA: Diagnosis not present

## 2023-07-13 DIAGNOSIS — L853 Xerosis cutis: Secondary | ICD-10-CM | POA: Diagnosis not present

## 2023-07-17 ENCOUNTER — Other Ambulatory Visit: Payer: Self-pay | Admitting: Cardiovascular Disease

## 2023-07-19 ENCOUNTER — Telehealth: Payer: Self-pay | Admitting: Oncology

## 2023-07-19 NOTE — Telephone Encounter (Signed)
Rescheduled appointment per room/resource. Patient is aware of the changes made to her upcoming appointments.  

## 2023-07-25 ENCOUNTER — Inpatient Hospital Stay: Payer: Medicare Other

## 2023-07-25 ENCOUNTER — Inpatient Hospital Stay: Payer: Medicare Other | Admitting: Internal Medicine

## 2023-08-09 ENCOUNTER — Telehealth: Payer: Self-pay | Admitting: Hematology and Oncology

## 2023-08-09 NOTE — Telephone Encounter (Signed)
 Rescheduled appointments per patients request via incoming call. Patient is aware of the changes made to her upcoming appointments.

## 2023-08-10 ENCOUNTER — Inpatient Hospital Stay: Payer: Medicare Other

## 2023-08-10 ENCOUNTER — Inpatient Hospital Stay: Payer: Medicare Other | Admitting: Oncology

## 2023-08-15 DIAGNOSIS — Z9013 Acquired absence of bilateral breasts and nipples: Secondary | ICD-10-CM | POA: Diagnosis not present

## 2023-08-15 DIAGNOSIS — F339 Major depressive disorder, recurrent, unspecified: Secondary | ICD-10-CM | POA: Diagnosis not present

## 2023-08-15 DIAGNOSIS — E039 Hypothyroidism, unspecified: Secondary | ICD-10-CM | POA: Diagnosis not present

## 2023-08-15 DIAGNOSIS — Z23 Encounter for immunization: Secondary | ICD-10-CM | POA: Diagnosis not present

## 2023-08-15 DIAGNOSIS — M159 Polyosteoarthritis, unspecified: Secondary | ICD-10-CM | POA: Diagnosis not present

## 2023-08-15 DIAGNOSIS — I10 Essential (primary) hypertension: Secondary | ICD-10-CM | POA: Diagnosis not present

## 2023-08-15 DIAGNOSIS — E1122 Type 2 diabetes mellitus with diabetic chronic kidney disease: Secondary | ICD-10-CM | POA: Diagnosis not present

## 2023-08-15 DIAGNOSIS — Z0000 Encounter for general adult medical examination without abnormal findings: Secondary | ICD-10-CM | POA: Diagnosis not present

## 2023-08-15 DIAGNOSIS — M810 Age-related osteoporosis without current pathological fracture: Secondary | ICD-10-CM | POA: Diagnosis not present

## 2023-08-15 DIAGNOSIS — E785 Hyperlipidemia, unspecified: Secondary | ICD-10-CM | POA: Diagnosis not present

## 2023-08-26 ENCOUNTER — Inpatient Hospital Stay: Payer: Medicare Other

## 2023-08-26 ENCOUNTER — Telehealth: Payer: Self-pay | Admitting: Hematology and Oncology

## 2023-08-26 ENCOUNTER — Inpatient Hospital Stay: Payer: Medicare Other | Attending: Hematology and Oncology | Admitting: Hematology and Oncology

## 2023-08-26 ENCOUNTER — Encounter: Payer: Self-pay | Admitting: Hematology and Oncology

## 2023-08-26 ENCOUNTER — Other Ambulatory Visit: Payer: Self-pay

## 2023-08-26 VITALS — BP 133/97 | HR 85 | Temp 97.8°F | Resp 15 | Ht 59.0 in

## 2023-08-26 DIAGNOSIS — Z9013 Acquired absence of bilateral breasts and nipples: Secondary | ICD-10-CM | POA: Insufficient documentation

## 2023-08-26 DIAGNOSIS — K5792 Diverticulitis of intestine, part unspecified, without perforation or abscess without bleeding: Secondary | ICD-10-CM | POA: Insufficient documentation

## 2023-08-26 DIAGNOSIS — D751 Secondary polycythemia: Secondary | ICD-10-CM

## 2023-08-26 DIAGNOSIS — J329 Chronic sinusitis, unspecified: Secondary | ICD-10-CM | POA: Insufficient documentation

## 2023-08-26 DIAGNOSIS — Z853 Personal history of malignant neoplasm of breast: Secondary | ICD-10-CM | POA: Diagnosis not present

## 2023-08-26 DIAGNOSIS — F32A Depression, unspecified: Secondary | ICD-10-CM | POA: Insufficient documentation

## 2023-08-26 DIAGNOSIS — R350 Frequency of micturition: Secondary | ICD-10-CM | POA: Diagnosis not present

## 2023-08-26 LAB — CMP (CANCER CENTER ONLY)
ALT: 27 U/L (ref 0–44)
AST: 21 U/L (ref 15–41)
Albumin: 4.3 g/dL (ref 3.5–5.0)
Alkaline Phosphatase: 107 U/L (ref 38–126)
Anion gap: 6 (ref 5–15)
BUN: 23 mg/dL (ref 8–23)
CO2: 29 mmol/L (ref 22–32)
Calcium: 10.1 mg/dL (ref 8.9–10.3)
Chloride: 104 mmol/L (ref 98–111)
Creatinine: 1.29 mg/dL — ABNORMAL HIGH (ref 0.44–1.00)
GFR, Estimated: 42 mL/min — ABNORMAL LOW (ref >60)
Glucose, Bld: 109 mg/dL — ABNORMAL HIGH (ref 70–99)
Potassium: 4.1 mmol/L (ref 3.5–5.1)
Sodium: 139 mmol/L (ref 135–145)
Total Bilirubin: 0.5 mg/dL (ref 0.0–1.2)
Total Protein: 7.6 g/dL (ref 6.5–8.1)

## 2023-08-26 LAB — CBC WITH DIFFERENTIAL/PLATELET
Abs Immature Granulocytes: 0.02 10*3/uL (ref 0.00–0.07)
Basophils Absolute: 0.1 10*3/uL (ref 0.0–0.1)
Basophils Relative: 1 %
Eosinophils Absolute: 0.2 10*3/uL (ref 0.0–0.5)
Eosinophils Relative: 3 %
HCT: 49.8 % — ABNORMAL HIGH (ref 36.0–46.0)
Hemoglobin: 17.1 g/dL — ABNORMAL HIGH (ref 12.0–15.0)
Immature Granulocytes: 0 %
Lymphocytes Relative: 16 %
Lymphs Abs: 1.2 10*3/uL (ref 0.7–4.0)
MCH: 30.8 pg (ref 26.0–34.0)
MCHC: 34.3 g/dL (ref 30.0–36.0)
MCV: 89.6 fL (ref 80.0–100.0)
Monocytes Absolute: 0.7 10*3/uL (ref 0.1–1.0)
Monocytes Relative: 9 %
Neutro Abs: 5.4 10*3/uL (ref 1.7–7.7)
Neutrophils Relative %: 71 %
Platelets: 346 10*3/uL (ref 150–400)
RBC: 5.56 MIL/uL — ABNORMAL HIGH (ref 3.87–5.11)
RDW: 12.7 % (ref 11.5–15.5)
WBC: 7.7 10*3/uL (ref 4.0–10.5)
nRBC: 0 % (ref 0.0–0.2)

## 2023-08-26 NOTE — Progress Notes (Signed)
[facility] CONSULT NOTE  Patient Care Team: Shirlean Mylar, MD as PCP - General (Family Medicine)  CHIEF COMPLAINTS/PURPOSE OF CONSULTATION:  Polycythemia  ASSESSMENT & PLAN:  Polycythemia, secondary Polycythemia Elevated red blood cells noted on multiple occasions. Discussed the differential diagnosis of primary polycythemia vera vs secondary polycythemia.  No significant weight loss, night sweats, erythromelalgia, intractable pruritus or change in health status. No hepatosplenomegaly or lymphadenopathy on exam. -Order JAK2 mutation and EPO level to evaluate for polycythemia vera. -If tests are negative, consider referral for sleep study to evaluate for sleep apnea as a cause of secondary polycythemia.  History of Breast Cancer Bilateral mastectomy performed over 20 years ago. No current symptoms or signs of recurrence. -Continue routine follow-up and surveillance as per oncology guidelines.  Diverticulitis History of diverticulitis with recent episode treated with Augmentin. No current symptoms. -Continue current management plan with primary care provider.  Chronic Sinusitis Reports of chronic sinus symptoms with increased mucus production. Appointment scheduled with Dr. Chanetta Marshall for further evaluation. -Continue current management plan with primary care provider.  Depression On Wellbutrin since husband's passing.  -Continue current management plan with primary care provider.  Follow-up in 2 weeks to discuss results of JAK2 mutation and EPO level tests.  Orders Placed This Encounter  Procedures   CBC with Differential/Platelet    Standing Status:   Standing    Number of Occurrences:   22    Expiration Date:   08/25/2024   Erythropoietin    Standing Status:   Future    Number of Occurrences:   1    Expiration Date:   08/25/2024   CMP (Cancer Center only)    Standing Status:   Future    Number of Occurrences:   1    Expiration Date:   08/25/2024      HISTORY OF PRESENTING ILLNESS:  Jamie Benjamin 78 y.o. female is here because of Polycythemia  Discussed the use of AI scribe software for clinical note transcription with the patient, who gave verbal consent to proceed.  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 The patient was asymptomatic and reported no changes in health status, except for possible fatigue. The patient has a history of left-sided breast cancer treated with bilateral mastectomy and chemotherapy over 20 years ago. The patient also reported a history of diverticulitis and a ruptured colon, which was managed conservatively with antibiotics.  The patient reported a recent weight loss of 25 pounds due to the use of Ozempic for prediabetes. The patient also mentioned a history of a mini-stroke following stent placement three years ago, which resolved without any residual deficits. The patient denied any history of smoking, alcohol, or drug use.  The patient also reported a history of depression following the death of their spouse and has been on Wellbutrin since then. The patient mentioned a recent episode of sinusitis, which has been ongoing for several months and is currently being treated with Augmentin.  The patient denied any shortness of breath, night sweats, or changes in bowel habits. The patient reported frequent urination at night, snoring, and teeth grinding, raising the possibility of sleep apnea. The patient also reported a recent change in anticoagulation therapy from Plavix to aspirin due to severe nosebleeds. Rest of the pertinent 10 point ROS reviewed and negative.  REVIEW OF SYSTEMS:  Constitutional: Denies fevers, chills or abnormal night sweats Eyes: Denies blurriness of vision, double vision or watery eyes Ears, nose, mouth, throat, and face:  Denies mucositis or sore throat Respiratory: Denies cough, dyspnea or wheezes Cardiovascular: Denies palpitation, chest discomfort or lower extremity swelling Gastrointestinal:  Denies nausea, heartburn or change in bowel habits Skin: Denies abnormal skin rashes Lymphatics: Denies new lymphadenopathy or easy bruising Neurological:Denies numbness, tingling or new weaknesses Behavioral/Psych: Mood is stable, no new changes  All other systems were reviewed with the patient and are negative.  MEDICAL HISTORY:  Past Medical History:  Diagnosis Date   Allergy    seasonal   Arthritis    Breast cancer (HCC) 2005   left   Depression    Diverticulitis    Hyperlipidemia    Hypertension    Hypothyroidism (acquired)    IBS (irritable bowel syndrome)    Obesity    Prediabetes    Stage 3a chronic kidney disease (HCC) 09/16/2020    SURGICAL HISTORY: Past Surgical History:  Procedure Laterality Date   COLONOSCOPY     CORONARY IMAGING/OCT N/A 09/17/2020   Procedure: INTRAVASCULAR IMAGING/OCT;  Surgeon: Swaziland, Peter M, MD;  Location: [facility] INVASIVE CV LAB;  Service: Cardiovascular;  Laterality: N/A;   CORONARY STENT INTERVENTION N/A 09/17/2020   Procedure: CORONARY STENT INTERVENTION;  Surgeon: Swaziland, Peter M, MD;  Location: [facility] INVASIVE CV LAB;  Service: Cardiovascular;  Laterality: N/A;   LEFT HEART CATH AND CORONARY ANGIOGRAPHY N/A 09/17/2020   Procedure: LEFT HEART CATH AND CORONARY ANGIOGRAPHY;  Surgeon: Swaziland, Peter M, MD;  Location: [facility] INVASIVE CV LAB;  Service: Cardiovascular;  Laterality: N/A;   MASTECTOMY, RADICAL Bilateral 10 years ago   ROTATOR CUFF REPAIR      SOCIAL HISTORY: Social History   Socioeconomic History   Marital status: Widowed    Spouse name: Not on file   Number of children: Not on file   Years of education: Not on file   Highest education level: Not on file  Occupational History   Occupation: Retired [employer] fundraiser  Tobacco Use   Smoking status: Never   Smokeless  tobacco: Never  Vaping Use   Vaping status: Never Used  Substance and Sexual Activity   Alcohol use: No   Drug use: No   Sexual activity: Not on file  Other Topics Concern   Not on file  Social History Narrative   Not on file   Social Drivers of Health   Financial Resource Strain: Not on file  Food Insecurity: Not on file  Transportation Needs: Not on file  Physical Activity: Not on file  Stress: Not on file  Social Connections: Not on file  Intimate Partner Violence: Not on file    FAMILY HISTORY: Family History  Problem Relation Age of Onset   Breast cancer Sister    Ulcerative colitis Sister    Colon cancer Neg Hx    Colon polyps Neg Hx    Esophageal cancer Neg Hx    Rectal cancer Neg Hx    Stomach cancer Neg Hx     ALLERGIES:  is allergic to influenza virus vaccine, lisinopril, and rosuvastatin.  MEDICATIONS:  Current Outpatient Medications  Medication Sig Dispense Refill   amLODipine (NORVASC) 10 MG tablet Take 10 mg by mouth daily.     buPROPion (WELLBUTRIN XL) 300 MG 24 hr tablet Take 300 mg by mouth daily.     Cholecalciferol (VITAMIN D) 50 MCG (2000 UT) tablet Take 2,000 Units by mouth daily.  clopidogrel (PLAVIX) 75 MG tablet TAKE 8 TABLETS (600 MG) FOR FIRST DOSE, THEN TAKE 1 TABLET ONCE DAILY. 90 tablet 3   Evolocumab (REPATHA SURECLICK) 140 MG/ML SOAJ Inject 140 mg into the skin every 14 (fourteen) days. Please call for yearly appointment to get more refills. 6 mL 0   glipiZIDE (GLUCOTROL XL) 2.5 MG 24 hr tablet Take 2.5 mg by mouth daily with breakfast.     levothyroxine (SYNTHROID, LEVOTHROID) 50 MCG tablet Take 50 mcg by mouth daily before breakfast.     metoprolol tartrate (LOPRESSOR) 100 MG tablet Take 100 mg by mouth 2 (two) times daily.     montelukast (SINGULAIR) 10 MG tablet Take 10 mg by mouth daily.     nitroGLYCERIN (NITROSTAT) 0.4 MG SL tablet Place 1 tablet (0.4 mg total) under the tongue every 5 (five) minutes as needed for chest pain.  (Patient not taking: Reported on 06/08/2021) 100 tablet 0   pravastatin (PRAVACHOL) 20 MG tablet Take 1 tablet (20 mg total) by mouth every evening. 90 tablet 3   No current facility-administered medications for this visit.     PHYSICAL EXAMINATION: ECOG PERFORMANCE STATUS: 0 - Asymptomatic  Vitals:   08/26/23 1000  BP: (!) 133/97  Pulse: 85  Resp: 15  Temp: 97.8 F (36.6 C)  SpO2: 100%   There were no vitals filed for this visit.  GENERAL:alert, no distress and comfortable SKIN: skin color, texture, turgor are normal, no rashes or significant lesions EYES: normal, conjunctiva are pink and non-injected, sclera clear OROPHARYNX:no exudate, no erythema and lips, buccal mucosa, and tongue normal  NECK: supple, thyroid normal size, non-tender, without nodularity LYMPH:  no palpable lymphadenopathy in the cervical, axillary  LUNGS: clear to auscultation and percussion with normal breathing effort HEART: regular rate & rhythm and no murmurs and no lower extremity edema ABDOMEN:abdomen soft, non-tender and normal bowel sounds Musculoskeletal:no cyanosis of digits and no clubbing  PSYCH: alert & oriented x 3 with fluent speech NEURO: no focal motor/sensory deficits  LABORATORY DATA:  I have reviewed the data as listed Lab Results  Component Value Date   WBC 7.7 08/26/2023   HGB 17.1 (H) 08/26/2023   HCT 49.8 (H) 08/26/2023   MCV 89.6 08/26/2023   PLT 346 08/26/2023     Chemistry      Component Value Date/Time   NA 138 01/02/2021 2111   NA 141 11/25/2020 1111   K 3.5 01/02/2021 2111   CL 105 01/02/2021 2111   CO2 25 01/02/2021 2111   BUN 28 (H) 01/02/2021 2111   BUN 35 (H) 11/25/2020 1111   CREATININE 1.35 (H) 01/02/2021 2111      Component Value Date/Time   CALCIUM 9.5 01/02/2021 2111   ALKPHOS 119 02/23/2021 1108   AST 19 02/23/2021 1108   ALT 29 02/23/2021 1108   BILITOT 0.4 02/23/2021 1108       RADIOGRAPHIC STUDIES: I have personally reviewed the  radiological images as listed and agreed with the findings in the report. No results found.  All questions were answered. The patient knows to call the clinic with any problems, questions or concerns. I spent 45 minutes in the care of this patient including H and P, review of records, counseling and coordination of care.     Rachel Moulds, MD 08/26/2023 11:07 AM

## 2023-08-26 NOTE — Telephone Encounter (Signed)
Spoke with patient confirming upcoming appointment

## 2023-08-26 NOTE — Assessment & Plan Note (Signed)
Polycythemia Elevated red blood cells noted on multiple occasions. Discussed the differential diagnosis of primary polycythemia vera vs secondary polycythemia.  No significant weight loss, night sweats, erythromelalgia, intractable pruritus or change in health status. No hepatosplenomegaly or lymphadenopathy on exam. -Order JAK2 mutation and EPO level to evaluate for polycythemia vera. -If tests are negative, consider referral for sleep study to evaluate for sleep apnea as a cause of secondary polycythemia.  History of Breast Cancer Bilateral mastectomy performed over 20 years ago. No current symptoms or signs of recurrence. -Continue routine follow-up and surveillance as per oncology guidelines.  Diverticulitis History of diverticulitis with recent episode treated with Augmentin. No current symptoms. -Continue current management plan with primary care provider.  Chronic Sinusitis Reports of chronic sinus symptoms with increased mucus production. Appointment scheduled with Dr. Chanetta Marshall for further evaluation. -Continue current management plan with primary care provider.  Depression On Wellbutrin since husband's passing.  -Continue current management plan with primary care provider.  Follow-up in 2 weeks to discuss results of JAK2 mutation and EPO level tests.

## 2023-08-27 LAB — ERYTHROPOIETIN: Erythropoietin: 7.9 m[IU]/mL (ref 2.6–18.5)

## 2023-09-01 LAB — MISC LABCORP TEST (SEND OUT): Labcorp test code: 489514

## 2023-09-04 ENCOUNTER — Telehealth: Payer: Self-pay | Admitting: Cardiovascular Disease

## 2023-09-04 DIAGNOSIS — E785 Hyperlipidemia, unspecified: Secondary | ICD-10-CM

## 2023-09-04 NOTE — Telephone Encounter (Signed)
Patient would like to know if she can do any labs that need to be done before her appt. Please advise

## 2023-09-05 NOTE — Telephone Encounter (Signed)
Called and spoke to patient. Verified name and DOB. Inform patient fasting lab for Lipid Panel ordered per Joni Reining.    Jodelle Gross, NP  Cv Div Nl  She should have fasting lipids please.  KL

## 2023-09-08 ENCOUNTER — Inpatient Hospital Stay: Payer: Medicare Other | Attending: Hematology and Oncology | Admitting: Hematology and Oncology

## 2023-09-08 DIAGNOSIS — D751 Secondary polycythemia: Secondary | ICD-10-CM

## 2023-09-08 NOTE — Progress Notes (Signed)
 [facility] CONSULT NOTE  Patient Care Team: Douglass Ivanoff, MD as PCP - General (Family Medicine)  CHIEF COMPLAINTS/PURPOSE OF CONSULTATION:  Polycythemia  ASSESSMENT & PLAN:  Polycythemia, secondary Polycythemia Elevated red blood cells noted on multiple occasions. Discussed the differential diagnosis of primary polycythemia vera vs secondary polycythemia.  No significant weight loss, night sweats, erythromelalgia, intractable pruritus or change in health status. No hepatosplenomegaly or lymphadenopathy on exam. -JAK2, CAL R and MPL mutation negative.  EPO levels are normal.  We once again discussed about considering sleep apnea testing, referral placed to sleep medicine given the possibility of secondary polycythemia.  If no evidence of sleep apnea and if she continues to have persistent polycythemia on repeat labs, we can consider bone marrow aspiration biopsy.  She is fine with this plan.    History of Breast Cancer Bilateral mastectomy performed over 20 years ago. No current symptoms or signs of recurrence. -Continue routine follow-up and surveillance as per oncology guidelines.  Follow-up in 3 months with repeat labs.   Orders Placed This Encounter  Procedures   Ambulatory referral to Sleep Studies    Referral Priority:   Routine    Referral Type:   Consultation    Referral Reason:   Specialty Services Required    Number of Visits Requested:   1     HISTORY OF PRESENTING ILLNESS:  Jamie Benjamin 78 y.o. female is here because of Polycythemia  Discussed the use of AI scribe software for clinical note transcription with the patient, who gave verbal consent to proceed.  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  She is here for telephone follow-up.  Since her last visit here, she apparently is trying  to get back into the just office because she is worried about the polycythemia and and not having to take blood thinners anymore.  She also reviewed her labs in My chart. Rest of the pertinent 10 point ROS reviewed and negative.  REVIEW OF SYSTEMS:   Constitutional: Denies fevers, chills or abnormal night sweats Eyes: Denies blurriness of vision, double vision or watery eyes Ears, nose, mouth, throat, and face: Denies mucositis or sore throat Respiratory: Denies cough, dyspnea or wheezes Cardiovascular: Denies palpitation, chest discomfort or lower extremity swelling Gastrointestinal:  Denies nausea, heartburn or change in bowel habits Skin: Denies abnormal skin rashes Lymphatics: Denies new lymphadenopathy or easy bruising Neurological:Denies numbness, tingling or new weaknesses Behavioral/Psych: Mood is stable, no new changes  All other systems were reviewed with the patient and are negative.  MEDICAL HISTORY:  Past Medical History:  Diagnosis Date   Allergy    seasonal   Arthritis    Breast cancer (HCC) 2005   left   Depression    Diverticulitis    Hyperlipidemia    Hypertension    Hypothyroidism (acquired)    IBS (irritable bowel syndrome)    Obesity    Prediabetes    Stage 3a chronic kidney disease (HCC) 09/16/2020    SURGICAL HISTORY: Past Surgical History:  Procedure Laterality Date   COLONOSCOPY     CORONARY IMAGING/OCT N/A 09/17/2020   Procedure: INTRAVASCULAR IMAGING/OCT;  Surgeon: Jordan, Peter M, MD;  Location: [facility] INVASIVE CV LAB;  Service: Cardiovascular;  Laterality: N/A;   CORONARY STENT INTERVENTION N/A 09/17/2020   Procedure: CORONARY STENT INTERVENTION;  Surgeon: Jordan, Peter M,  MD;  Location: [facility] INVASIVE CV LAB;  Service: Cardiovascular;  Laterality: N/A;   LEFT HEART CATH AND CORONARY ANGIOGRAPHY N/A 09/17/2020   Procedure: LEFT HEART CATH AND CORONARY ANGIOGRAPHY;  Surgeon: Jordan, Peter M, MD;  Location: [facility] INVASIVE CV LAB;  Service: Cardiovascular;   Laterality: N/A;   MASTECTOMY, RADICAL Bilateral 10 years ago   ROTATOR CUFF REPAIR      SOCIAL HISTORY: Social History   Socioeconomic History   Marital status: Widowed    Spouse name: Not on file   Number of children: Not on file   Years of education: Not on file   Highest education level: Not on file  Occupational History   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  Tobacco Use   Smoking status: Never   Smokeless tobacco: Never  Vaping Use   Vaping status: Never Used  Substance and Sexual Activity   Alcohol use: No   Drug use: No   Sexual activity: Not on file  Other Topics Concern   Not on file  Social History Narrative   Not on file   Social Drivers of Health   Financial Resource Strain: Not on file  Food Insecurity: Not on file  Transportation Needs: Not on file  Physical Activity: Not on file  Stress: Not on file  Social Connections: Not on file  Intimate Partner Violence: Not on file    FAMILY HISTORY: Family History  Problem Relation Age of Onset   Breast cancer Sister    Ulcerative colitis Sister    Colon cancer Neg Hx    Colon polyps Neg Hx    Esophageal cancer Neg Hx    Rectal cancer Neg Hx    Stomach cancer Neg Hx     ALLERGIES:  is allergic to influenza virus vaccine, lisinopril, and rosuvastatin .  MEDICATIONS:  Current Outpatient Medications  Medication Sig Dispense Refill   amLODipine  (NORVASC ) 10 MG tablet Take 10 mg by mouth daily.     buPROPion  (WELLBUTRIN  XL) 300 MG 24 hr tablet Take 300 mg by mouth daily.     Cholecalciferol  (VITAMIN D ) 50 MCG (2000 UT) tablet Take 2,000 Units by mouth daily.     clopidogrel  (PLAVIX ) 75 MG tablet TAKE 8 TABLETS (600 MG) FOR FIRST DOSE, THEN TAKE 1 TABLET ONCE DAILY. 90 tablet 3   Evolocumab  (REPATHA  SURECLICK) 140 MG/ML SOAJ Inject 140 mg into the skin every 14 (fourteen) days. Please call for yearly appointment to get more refills. 6 mL 0   glipiZIDE (GLUCOTROL XL) 2.5 MG 24 hr tablet Take 2.5 mg by mouth daily with  breakfast.     levothyroxine  (SYNTHROID , LEVOTHROID) 50 MCG tablet Take 50 mcg by mouth daily before breakfast.     metoprolol  tartrate (LOPRESSOR ) 100 MG tablet Take 100 mg by mouth 2 (two) times daily.     montelukast (SINGULAIR) 10 MG tablet Take 10 mg by mouth daily.     nitroGLYCERIN  (NITROSTAT ) 0.4 MG SL tablet Place 1 tablet (0.4 mg total) under the tongue every 5 (five) minutes as needed for chest pain. (Patient not taking: Reported on 06/08/2021) 100 tablet 0   pravastatin  (PRAVACHOL ) 20 MG tablet Take 1 tablet (20 mg total) by mouth every evening. 90 tablet 3   No current facility-administered medications for this visit.     PHYSICAL EXAMINATION: ECOG PERFORMANCE STATUS: 0 - Asymptomatic  There were no vitals filed for this visit.  There were no vitals filed for this visit. Physical exam deferred, telephone visit LABORATORY DATA:  I  have reviewed the data as listed Lab Results  Component Value Date   WBC 7.7 08/26/2023   HGB 17.1 (H) 08/26/2023   HCT 49.8 (H) 08/26/2023   MCV 89.6 08/26/2023   PLT 346 08/26/2023     Chemistry      Component Value Date/Time   NA 139 08/26/2023 1023   NA 141 11/25/2020 1111   K 4.1 08/26/2023 1023   CL 104 08/26/2023 1023   CO2 29 08/26/2023 1023   BUN 23 08/26/2023 1023   BUN 35 (H) 11/25/2020 1111   CREATININE 1.29 (H) 08/26/2023 1023      Component Value Date/Time   CALCIUM  10.1 08/26/2023 1023   ALKPHOS 107 08/26/2023 1023   AST 21 08/26/2023 1023   ALT 27 08/26/2023 1023   BILITOT 0.5 08/26/2023 1023     Labs reviewed  RADIOGRAPHIC STUDIES: I have personally reviewed the radiological images as listed and agreed with the findings in the report. No results found.  All questions were answered. The patient knows to call the clinic with any problems, questions or concerns.  I connected with  Jamie Benjamin on 09/08/23 by a telephone application and verified that I am speaking with the correct person using two  identifiers.   I discussed the limitations of evaluation and management by telemedicine. The patient expressed understanding and agreed to proceed.  Time spent: 10 min Location of provider: clinic Location of patient : home.     Amber Stalls, MD 09/08/2023 2:27 PM

## 2023-09-08 NOTE — Assessment & Plan Note (Signed)
 Polycythemia Elevated red blood cells noted on multiple occasions. Discussed the differential diagnosis of primary polycythemia vera vs secondary polycythemia.  No significant weight loss, night sweats, erythromelalgia, intractable pruritus or change in health status. No hepatosplenomegaly or lymphadenopathy on exam. -JAK2, CAL R and MPL mutation negative.  EPO levels are normal.  We once again discussed about considering sleep apnea testing, referral placed to sleep medicine given the possibility of secondary polycythemia.  If no evidence of sleep apnea and if she continues to have persistent polycythemia on repeat labs, we can consider bone marrow aspiration biopsy.  She is fine with this plan.    History of Breast Cancer Bilateral mastectomy performed over 20 years ago. No current symptoms or signs of recurrence. -Continue routine follow-up and surveillance as per oncology guidelines.  Follow-up in 3 months with repeat labs.

## 2023-09-11 ENCOUNTER — Telehealth: Payer: Self-pay | Admitting: Hematology and Oncology

## 2023-09-11 NOTE — Telephone Encounter (Signed)
 Left patient a vm regarding upcoming appointment

## 2023-09-12 ENCOUNTER — Ambulatory Visit: Payer: Medicare Other | Admitting: Cardiovascular Disease

## 2023-09-14 ENCOUNTER — Other Ambulatory Visit: Payer: Self-pay

## 2023-09-14 DIAGNOSIS — E785 Hyperlipidemia, unspecified: Secondary | ICD-10-CM | POA: Diagnosis not present

## 2023-09-14 LAB — LIPID PANEL
Chol/HDL Ratio: 1.8 {ratio} (ref 0.0–4.4)
Cholesterol, Total: 116 mg/dL (ref 100–199)
HDL: 63 mg/dL (ref >39)
LDL Chol Calc (NIH): 35 mg/dL (ref 0–99)
Triglycerides: 97 mg/dL (ref 0–149)
VLDL Cholesterol Cal: 18 mg/dL (ref 5–40)

## 2023-09-16 NOTE — Progress Notes (Unsigned)
 Cardiology Office Note:  .   Date:  09/18/2023  ID:  Jamie Benjamin, DOB 02-08-1946, MRN [id number] PCP: Shirlean Mylar, MD  [facility] Providers Cardiologist:    History of Present Illness: .   Jamie Benjamin is a 78 y.o. 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 Swaziland. She had PCI and drug-eluting stenting of the mid LAD.She was on Plavix. There is no other significant CAD.  embolic CVA, hypothyroidism,IBS, CKD Stage III, Polycythemia, breast cancer, HL on pravastatin and Praluent Last seen by Dr. Allyson Sabal on 05/04/2022.  She comes today without any cardiac complaints.  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  Her hematologist also wants to do a bone marrow but she is chosen to hold off on that until the sleep study is completed.  She is not very physically active due to chronic arthritis pain but does walk on her treadmill daily at a slower pace.  She is no longer on Plavix.  She offers no complaints of shortness of breath, chest pain, dizziness, or fatigue.  If she is medically compliant.  She is not in need of any refills as this is being provided to her by her primary care provider.  Her son is an ED physician, Dr. Oletta Cohn here in [place].  ROS: As above otherwise negative  Studies Reviewed: .   Echocardiogram 01/28/2021 1. Left ventricular ejection fraction by 3D volume is 63 %. The left  ventricle has normal function. The left ventricle has no regional wall  motion abnormalities. Left ventricular diastolic parameters are consistent  with Grade I diastolic dysfunction  (impaired relaxation).   2. Right ventricular systolic function is normal. The right ventricular  size is mildly enlarged. Tricuspid regurgitation signal is inadequate for  assessing PA pressure.   3. The mitral valve is normal in structure. Trivial mitral valve   regurgitation. No evidence of mitral stenosis.   4. The aortic valve is grossly normal. There is mild calcification of the  aortic valve. Aortic valve regurgitation is not visualized. No aortic  stenosis is present.   5. The inferior vena cava is normal in size with greater than 50%  respiratory variability, suggesting right atrial pressure of 3 mmHg.   Physical Exam:   VS:  BP 116/68 (BP Location: Left Arm, Patient Position: Sitting, Cuff Size: Normal)   Pulse 79   Ht 4\' 11"  (1.499 m)   Wt 151 lb 9.6 oz (68.8 kg)   SpO2 97%   BMI 30.62 kg/m    Wt Readings from Last 3 Encounters:  09/18/23 151 lb 9.6 oz (68.8 kg)  05/04/22 166 lb (75.3 kg)  09/21/21 168 lb (76.2 kg)    GEN: Well nourished, well developed in no acute distress NECK: No JVD; No carotid bruits CARDIAC: RRR, no murmurs, rubs, gallops RESPIRATORY:  Clear to auscultation without rales, wheezing or rhonchi  ABDOMEN: Soft, non-tender, non-distended EXTREMITIES:  No edema; arthritic changes in her hands  ASSESSMENT AND PLAN: .    Coronary artery disease: Status post PCI with drug-eluting stent to the mid LAD on 09/16/2020.  She continues on aspirin but not on Plavix.  She is also on secondary prevention with statin therapy, PCSK9.  She is not very physically active due to arthritis.  She is not sedentary however.  She does walk on a treadmill for 45 minutes at a  slow pace.  She is asymptomatic with this.  Continue current management.  2.  Hypercholesterolemia: Most recent lipid panel on 09/14/2023 total cholesterol 116 HDL 63 LDL 35.  She remains on pravastatin and Repatha.  She has refills on this but her insurance is telling her that it is not approved.  She is calling them today.  Happy to help her to our pharmacy if this creates a problem for her.  3.  Hypertension: She remains on amlodipine blood pressure is very well-controlled.  No changes in her regimen  4.  Type 2 diabetes: She is only on Ozempic once a week.   Hemoglobin A1c is 5.6.  Followed by PCP.  5.  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  She is being scheduled to have a sleep study.  The patient's hematologist is also recommending bone marrow biopsy which she is holding off on and will talk with her hematologist more about on next appointment.       Signed, Bettey Mare. Liborio Nixon, ANP, AACC

## 2023-09-18 ENCOUNTER — Other Ambulatory Visit (HOSPITAL_COMMUNITY): Payer: Self-pay

## 2023-09-18 ENCOUNTER — Ambulatory Visit: Payer: Medicare Other | Attending: Adult Health | Admitting: Adult Health

## 2023-09-18 ENCOUNTER — Telehealth: Payer: Self-pay | Admitting: Pharmacy Technician

## 2023-09-18 ENCOUNTER — Encounter: Payer: Self-pay | Admitting: Adult Health

## 2023-09-18 ENCOUNTER — Telehealth: Payer: Self-pay

## 2023-09-18 VITALS — BP 116/68 | HR 79 | Ht 59.0 in | Wt 151.6 lb

## 2023-09-18 DIAGNOSIS — E7800 Pure hypercholesterolemia, unspecified: Secondary | ICD-10-CM | POA: Insufficient documentation

## 2023-09-18 DIAGNOSIS — I2510 Atherosclerotic heart disease of native coronary artery without angina pectoris: Secondary | ICD-10-CM | POA: Insufficient documentation

## 2023-09-18 DIAGNOSIS — R7303 Prediabetes: Secondary | ICD-10-CM | POA: Insufficient documentation

## 2023-09-18 DIAGNOSIS — I10 Essential (primary) hypertension: Secondary | ICD-10-CM | POA: Insufficient documentation

## 2023-09-18 DIAGNOSIS — D751 Secondary polycythemia: Secondary | ICD-10-CM | POA: Insufficient documentation

## 2023-09-18 NOTE — Telephone Encounter (Signed)
 Pharmacy Patient Advocate Encounter  Received notification from [employer] that Prior Authorization for Repatha has been APPROVED from 09/18/23 to 09/17/24. Ran test claim, Copay is $30.00- one month. This test claim was processed through [facility]- copay amounts may vary at other pharmacies due to pharmacy/plan contracts, or as the patient moves through the different stages of their insurance plan.   PA #/Case ID/Reference #: [id number]

## 2023-09-18 NOTE — Telephone Encounter (Signed)
 Pharmacy Patient Advocate Encounter   Received notification from CoverMyMeds that prior authorization for Repatha is required/requested.   Insurance verification completed.   The patient is insured through [employer] .   Per test claim: PA required; PA submitted to above mentioned insurance via CoverMyMeds Key/confirmation #/EOC [id number] Status is pending

## 2023-09-18 NOTE — Patient Instructions (Signed)
Medication Instructions:   No Changes *If you need a refill on your cardiac medications before your next appointment, please call your pharmacy*   Lab Work: No labs If you have labs (blood work) drawn today and your tests are completely normal, you will receive your results only by: MyChart Message (if you have MyChart) OR A paper copy in the mail If you have any lab test that is abnormal or we need to change your treatment, we will call you to review the results.   Testing/Procedures: No Testing   Follow-Up: At [facility], you and your health needs are our priority.  As part of our continuing mission to provide you with exceptional heart care, we have created designated Provider Care Teams.  These Care Teams include your primary Cardiologist (physician) and Advanced Practice Providers (APPs -  Physician Assistants and Nurse Practitioners) who all work together to provide you with the care you need, when you need it.  We recommend signing up for the patient portal called "MyChart".  Sign up information is provided on this After Visit Summary.  MyChart is used to connect with patients for Virtual Visits (Telemedicine).  Patients are able to view lab/test results, encounter notes, upcoming appointments, etc.  Non-urgent messages can be sent to your provider as well.   To learn more about what you can do with MyChart, go to [email or web address].    Your next appointment:   6 month(s)  Provider:   Jonathan Berry, MD    

## 2023-09-18 NOTE — Telephone Encounter (Addendum)
 Results viewed by patient via Mychart.----- Message from Joni Reining sent at 09/15/2023  9:37 AM EST ----- I reviewed with a panel. Very good control of cholesterol. No changes in medication regimen.  KL

## 2023-11-07 ENCOUNTER — Telehealth: Payer: Self-pay | Admitting: Hematology and Oncology

## 2023-11-07 NOTE — Telephone Encounter (Signed)
 Spoke with pt about rescheduled appt date and time.

## 2023-12-06 DIAGNOSIS — N1832 Chronic kidney disease, stage 3b: Secondary | ICD-10-CM | POA: Diagnosis not present

## 2023-12-08 ENCOUNTER — Other Ambulatory Visit: Payer: Medicare Other

## 2023-12-08 ENCOUNTER — Ambulatory Visit: Payer: Medicare Other | Admitting: Hematology and Oncology

## 2023-12-14 DIAGNOSIS — D631 Anemia in chronic kidney disease: Secondary | ICD-10-CM | POA: Diagnosis not present

## 2023-12-14 DIAGNOSIS — N1832 Chronic kidney disease, stage 3b: Secondary | ICD-10-CM | POA: Diagnosis not present

## 2023-12-14 DIAGNOSIS — I129 Hypertensive chronic kidney disease with stage 1 through stage 4 chronic kidney disease, or unspecified chronic kidney disease: Secondary | ICD-10-CM | POA: Diagnosis not present

## 2023-12-14 DIAGNOSIS — E1122 Type 2 diabetes mellitus with diabetic chronic kidney disease: Secondary | ICD-10-CM | POA: Diagnosis not present

## 2023-12-14 DIAGNOSIS — N2581 Secondary hyperparathyroidism of renal origin: Secondary | ICD-10-CM | POA: Diagnosis not present

## 2023-12-18 DIAGNOSIS — E1122 Type 2 diabetes mellitus with diabetic chronic kidney disease: Secondary | ICD-10-CM | POA: Diagnosis not present

## 2023-12-18 DIAGNOSIS — Z6830 Body mass index (BMI) 30.0-30.9, adult: Secondary | ICD-10-CM | POA: Diagnosis not present

## 2023-12-18 DIAGNOSIS — M25511 Pain in right shoulder: Secondary | ICD-10-CM | POA: Diagnosis not present

## 2023-12-18 DIAGNOSIS — E66811 Obesity, class 1: Secondary | ICD-10-CM | POA: Diagnosis not present

## 2023-12-18 NOTE — Progress Notes (Signed)
 [facility] CONSULT NOTE  Patient Care Team: Sylvester Evert, MD as PCP - General (Family Medicine)  CHIEF COMPLAINTS/PURPOSE OF CONSULTATION:  Polycythemia  ASSESSMENT & PLAN:  Breast cancer [facility]) This is a very pleasant 78 yr old female pt with polycythemia referred to hematology.  Assessment and Plan Assessment & Plan Secondary polycythemia Hemoglobin stable at 16.8 g/dL, decreased from 09.8 g/dL. Normal erythropoietin  levels. Rest of the MPN testing neg, low likelihood of polycythemia vera. - could be secondary to OSA?  - She remains asymptomatic and no significant worsening in the past few months hence we have recommended active surveillance - Monitor hemoglobin with CBC in six months. She will get a blood draw in 3 months at Dr Jeral Moll office.  Obstructive sleep apnea Suspected obstructive sleep apnea contributing to secondary polycythemia. No definitive diagnosis or sleep study conducted. Patient hesitant about sleep study. - Monitor symptoms, consider sleep study if symptoms worsen or do not improve with weight loss.  Arthritis Chronic arthritis affecting multiple joints, associated with aging.  Rotator cuff syndrome Symptoms consistent with rotator cuff syndrome, likely related to arthritis and aging.  Follow-up Plan to monitor hemoglobin levels and overall health status. - Schedule follow-up appointment in six months.    Polycythemia, secondary This is a very pleasant 78 yr old female pt with polycythemia referred to hematology.  Assessment and Plan Assessment & Plan Secondary polycythemia Hemoglobin stable at 16.8 g/dL, decreased from 11.9 g/dL. Normal erythropoietin  levels. Rest of the MPN testing neg, low likelihood of polycythemia vera. - could be secondary to OSA?  - She remains asymptomatic and no significant worsening in the past few months hence we have recommended active surveillance - Monitor hemoglobin with CBC in six months. She will get a  blood draw in 3 months at Dr Jeral Moll office.  Obstructive sleep apnea Suspected obstructive sleep apnea contributing to secondary polycythemia. No definitive diagnosis or sleep study conducted. Patient hesitant about sleep study. - Monitor symptoms, consider sleep study if symptoms worsen or do not improve with weight loss.  Arthritis Chronic arthritis affecting multiple joints, associated with aging.  Rotator cuff syndrome Symptoms consistent with rotator cuff syndrome, likely related to arthritis and aging.  Follow-up Plan to monitor hemoglobin levels and overall health status. - Schedule follow-up appointment in six months.     No orders of the defined types were placed in this encounter.    HISTORY OF PRESENTING ILLNESS:  Jamie Benjamin 78 y.o. female is here because of Polycythemia  History of Present Illness    Discussed the use of AI scribe software for clinical note transcription with the patient, who gave verbal consent to proceed.  History of Present Illness Jamie Benjamin is a 78 year old female who presents for follow-up of polycythemia.  Her hemoglobin levels have remained stable over the past year, with a slight decrease from 17.1 g/dL in January to 14.7 g/dL currently. Previous blood work with no suggestions of polycythemia vera, as erythropoietin  levels were normal. She has no symptoms of hyperviscosity such as blurred vision, shortness of breath, bleeding, or headaches.  She has been on Ozempic for almost a year, previously taking 1 mg, and recently increased to 2 mg to aid in weight loss. She has not experienced significant side effects but is hesitant to start the increased dose due to potential gastrointestinal issues.  She experiences nocturia, waking up at least twice a night to urinate, which disrupts her sleep. She is unsure if she snores,  as she has not shared a bed with anyone for 18 years.  Rest of the pertinent 10 point ROS reviewed and  negative.  REVIEW OF SYSTEMS:   Constitutional: Denies fevers, chills or abnormal night sweats Eyes: Denies blurriness of vision, double vision or watery eyes Ears, nose, mouth, throat, and face: Denies mucositis or sore throat Respiratory: Denies cough, dyspnea or wheezes Cardiovascular: Denies palpitation, chest discomfort or lower extremity swelling Gastrointestinal:  Denies nausea, heartburn or change in bowel habits Skin: Denies abnormal skin rashes Lymphatics: Denies new lymphadenopathy or easy bruising Neurological:Denies numbness, tingling or new weaknesses Behavioral/Psych: Mood is stable, no new changes  All other systems were reviewed with the patient and are negative.  MEDICAL HISTORY:  Past Medical History:  Diagnosis Date   Allergy    seasonal   Arthritis    Breast cancer (HCC) 2005   left   Depression    Diverticulitis    Hyperlipidemia    Hypertension    Hypothyroidism (acquired)    IBS (irritable bowel syndrome)    Obesity    Prediabetes    Stage 3a chronic kidney disease (HCC) 09/16/2020    SURGICAL HISTORY: Past Surgical History:  Procedure Laterality Date   COLONOSCOPY     CORONARY IMAGING/OCT N/A 09/17/2020   Procedure: INTRAVASCULAR IMAGING/OCT;  Surgeon: Swaziland, Peter M, MD;  Location: [facility] INVASIVE CV LAB;  Service: Cardiovascular;  Laterality: N/A;   CORONARY STENT INTERVENTION N/A 09/17/2020   Procedure: CORONARY STENT INTERVENTION;  Surgeon: Swaziland, Peter M, MD;  Location: [facility] INVASIVE CV LAB;  Service: Cardiovascular;  Laterality: N/A;   LEFT HEART CATH AND CORONARY ANGIOGRAPHY N/A 09/17/2020   Procedure: LEFT HEART CATH AND CORONARY ANGIOGRAPHY;  Surgeon: Swaziland, Peter M, MD;  Location: [facility] INVASIVE CV LAB;  Service: Cardiovascular;  Laterality: N/A;   MASTECTOMY, RADICAL Bilateral 10 years ago   ROTATOR CUFF REPAIR      SOCIAL HISTORY: Social History   Socioeconomic History   Marital status: Widowed    Spouse name: Not on file   Number of  children: Not on file   Years of education: Not on file   Highest education level: Not on file  Occupational History   Occupation: Retired Charity fundraiser  Tobacco Use   Smoking status: Never   Smokeless tobacco: Never  Vaping Use   Vaping status: Never Used  Substance and Sexual Activity   Alcohol use: No   Drug use: No   Sexual activity: Not on file  Other Topics Concern   Not on file  Social History Narrative   Not on file   Social Drivers of Health   Financial Resource Strain: Not on file  Food Insecurity: Not on file  Transportation Needs: Not on file  Physical Activity: Not on file  Stress: Not on file  Social Connections: Not on file  Intimate Partner Violence: Not on file    FAMILY HISTORY: Family History  Problem Relation Age of Onset   Breast cancer Sister    Ulcerative colitis Sister    Colon cancer Neg Hx    Colon polyps Neg Hx    Esophageal cancer Neg Hx    Rectal cancer Neg Hx    Stomach cancer Neg Hx     ALLERGIES:  is allergic to influenza virus vaccine, lisinopril, and rosuvastatin .  MEDICATIONS:  Current Outpatient Medications  Medication Sig Dispense Refill   amLODipine  (NORVASC ) 10 MG tablet Take 10 mg by mouth daily.     buPROPion  (WELLBUTRIN  XL) 300  MG 24 hr tablet Take 300 mg by mouth daily.     Cholecalciferol  (VITAMIN D ) 50 MCG (2000 UT) tablet Take 2,000 Units by mouth daily.     Evolocumab  (REPATHA  SURECLICK) 140 MG/ML SOAJ Inject 140 mg into the skin every 14 (fourteen) days. Please call for yearly appointment to get more refills. 6 mL 0   FARXIGA 10 MG TABS tablet Take 10 mg by mouth daily.     levothyroxine  (SYNTHROID , LEVOTHROID) 50 MCG tablet Take 50 mcg by mouth daily before breakfast.     metoprolol  tartrate (LOPRESSOR ) 100 MG tablet Take 100 mg by mouth 2 (two) times daily.     nitroGLYCERIN  (NITROSTAT ) 0.4 MG SL tablet Place 1 tablet (0.4 mg total) under the tongue every 5 (five) minutes as needed for chest pain. (Patient not taking:  Reported on 06/08/2021) 100 tablet 0   OZEMPIC, 1 MG/DOSE, 4 MG/3ML SOPN 1 mg once a week.     pravastatin  (PRAVACHOL ) 20 MG tablet Take 1 tablet (20 mg total) by mouth every evening. 90 tablet 3   No current facility-administered medications for this visit.     PHYSICAL EXAMINATION: ECOG PERFORMANCE STATUS: 0 - Asymptomatic  Vitals:   12/19/23 1032  BP: 124/71  Pulse: 83  Resp: 17  Temp: 98.1 F (36.7 C)  SpO2: 98%    Filed Weights   12/19/23 1032  Weight: 149 lb 6.4 oz (67.8 kg)   Physical Exam Constitutional:      Appearance: Normal appearance.  Cardiovascular:     Rate and Rhythm: Normal rate and regular rhythm.     Pulses: Normal pulses.     Heart sounds: Normal heart sounds.  Pulmonary:     Effort: Pulmonary effort is normal.     Breath sounds: Normal breath sounds.  Abdominal:     General: Abdomen is flat.     Palpations: Abdomen is soft.  Musculoskeletal:        General: No swelling. Normal range of motion.     Cervical back: Normal range of motion and neck supple. No rigidity.  Lymphadenopathy:     Cervical: No cervical adenopathy.  Skin:    General: Skin is warm and dry.  Neurological:     General: No focal deficit present.     Mental Status: She is alert.      LABORATORY DATA:  I have reviewed the data as listed Lab Results  Component Value Date   WBC 7.6 12/19/2023   HGB 16.8 (H) 12/19/2023   HCT 49.0 (H) 12/19/2023   MCV 88.8 12/19/2023   PLT 290 12/19/2023     Chemistry      Component Value Date/Time   NA 139 08/26/2023 1023   NA 141 11/25/2020 1111   K 4.1 08/26/2023 1023   CL 104 08/26/2023 1023   CO2 29 08/26/2023 1023   BUN 23 08/26/2023 1023   BUN 35 (H) 11/25/2020 1111   CREATININE 1.29 (H) 08/26/2023 1023      Component Value Date/Time   CALCIUM  10.1 08/26/2023 1023   ALKPHOS 107 08/26/2023 1023   AST 21 08/26/2023 1023   ALT 27 08/26/2023 1023   BILITOT 0.5 08/26/2023 1023     Labs reviewed  RADIOGRAPHIC  STUDIES: I have personally reviewed the radiological images as listed and agreed with the findings in the report. No results found.  All questions were answered. The patient knows to call the clinic with any problems, questions or concerns.  Murleen Arms, MD 12/19/2023 4:43 PM

## 2023-12-19 ENCOUNTER — Inpatient Hospital Stay

## 2023-12-19 ENCOUNTER — Inpatient Hospital Stay: Attending: Hematology and Oncology | Admitting: Hematology and Oncology

## 2023-12-19 VITALS — BP 124/71 | HR 83 | Temp 98.1°F | Resp 17 | Wt 149.4 lb

## 2023-12-19 DIAGNOSIS — D751 Secondary polycythemia: Secondary | ICD-10-CM

## 2023-12-19 DIAGNOSIS — M1990 Unspecified osteoarthritis, unspecified site: Secondary | ICD-10-CM | POA: Insufficient documentation

## 2023-12-19 DIAGNOSIS — G4733 Obstructive sleep apnea (adult) (pediatric): Secondary | ICD-10-CM | POA: Diagnosis not present

## 2023-12-19 DIAGNOSIS — Z803 Family history of malignant neoplasm of breast: Secondary | ICD-10-CM | POA: Diagnosis not present

## 2023-12-19 LAB — CBC WITH DIFFERENTIAL/PLATELET
Abs Immature Granulocytes: 0.02 10*3/uL (ref 0.00–0.07)
Basophils Absolute: 0.1 10*3/uL (ref 0.0–0.1)
Basophils Relative: 1 %
Eosinophils Absolute: 0.2 10*3/uL (ref 0.0–0.5)
Eosinophils Relative: 3 %
HCT: 49 % — ABNORMAL HIGH (ref 36.0–46.0)
Hemoglobin: 16.8 g/dL — ABNORMAL HIGH (ref 12.0–15.0)
Immature Granulocytes: 0 %
Lymphocytes Relative: 18 %
Lymphs Abs: 1.4 10*3/uL (ref 0.7–4.0)
MCH: 30.4 pg (ref 26.0–34.0)
MCHC: 34.3 g/dL (ref 30.0–36.0)
MCV: 88.8 fL (ref 80.0–100.0)
Monocytes Absolute: 0.7 10*3/uL (ref 0.1–1.0)
Monocytes Relative: 9 %
Neutro Abs: 5.3 10*3/uL (ref 1.7–7.7)
Neutrophils Relative %: 69 %
Platelets: 290 10*3/uL (ref 150–400)
RBC: 5.52 MIL/uL — ABNORMAL HIGH (ref 3.87–5.11)
RDW: 12.6 % (ref 11.5–15.5)
WBC: 7.6 10*3/uL (ref 4.0–10.5)
nRBC: 0 % (ref 0.0–0.2)

## 2023-12-19 NOTE — Assessment & Plan Note (Signed)
 This is a very pleasant 78 yr old female pt with polycythemia referred to hematology.  Assessment and Plan Assessment & Plan Secondary polycythemia Hemoglobin stable at 16.8 g/dL, decreased from 16.1 g/dL. Normal erythropoietin  levels. Rest of the MPN testing neg, low likelihood of polycythemia vera. - could be secondary to OSA?  - She remains asymptomatic and no significant worsening in the past few months hence we have recommended active surveillance - Monitor hemoglobin with CBC in six months. She will get a blood draw in 3 months at Dr Jeral Moll office.  Obstructive sleep apnea Suspected obstructive sleep apnea contributing to secondary polycythemia. No definitive diagnosis or sleep study conducted. Patient hesitant about sleep study. - Monitor symptoms, consider sleep study if symptoms worsen or do not improve with weight loss.  Arthritis Chronic arthritis affecting multiple joints, associated with aging.  Rotator cuff syndrome Symptoms consistent with rotator cuff syndrome, likely related to arthritis and aging.  Follow-up Plan to monitor hemoglobin levels and overall health status. - Schedule follow-up appointment in six months.

## 2023-12-20 DIAGNOSIS — Z85828 Personal history of other malignant neoplasm of skin: Secondary | ICD-10-CM | POA: Diagnosis not present

## 2023-12-20 DIAGNOSIS — D0339 Melanoma in situ of other parts of face: Secondary | ICD-10-CM | POA: Diagnosis not present

## 2023-12-20 DIAGNOSIS — D485 Neoplasm of uncertain behavior of skin: Secondary | ICD-10-CM | POA: Diagnosis not present

## 2023-12-20 DIAGNOSIS — D034 Melanoma in situ of scalp and neck: Secondary | ICD-10-CM | POA: Diagnosis not present

## 2023-12-20 DIAGNOSIS — L570 Actinic keratosis: Secondary | ICD-10-CM | POA: Diagnosis not present

## 2023-12-20 DIAGNOSIS — L820 Inflamed seborrheic keratosis: Secondary | ICD-10-CM | POA: Diagnosis not present

## 2023-12-21 ENCOUNTER — Telehealth: Payer: Self-pay | Admitting: Pharmacy Technician

## 2023-12-21 ENCOUNTER — Other Ambulatory Visit (HOSPITAL_COMMUNITY): Payer: Self-pay

## 2023-12-21 ENCOUNTER — Telehealth: Payer: Self-pay | Admitting: Cardiovascular Disease

## 2023-12-21 MED ORDER — REPATHA SURECLICK 140 MG/ML ~~LOC~~ SOAJ: 1.0000 | SUBCUTANEOUS | 0 refills | Status: DC

## 2023-12-21 NOTE — Telephone Encounter (Signed)
 Pt c/o medication issue:  1. Name of Medication: Evolocumab (REPATHA SURECLICK) 140 MG/ML SOAJ   2. How are you currently taking this medication (dosage and times per day)?    3. Are you having a reaction (difficulty breathing--STAT)? no  4. What is your medication issue? Prior Berkley Harvey is needed. Please advise

## 2023-12-21 NOTE — Telephone Encounter (Signed)
  PER 09/18/23 ENCOUNTER:  Pharmacy Patient Advocate Encounter   Received notification from [employer] that Prior Authorization for Repatha  has been APPROVED from 09/18/23 to 09/17/24.     I ran a test claim and it came back 0.00. I called [facility] and asked them to run the prescription but they are out of refills. I sent message in other encounter

## 2023-12-26 ENCOUNTER — Other Ambulatory Visit: Payer: Self-pay | Admitting: Pharmacist

## 2023-12-26 DIAGNOSIS — M25511 Pain in right shoulder: Secondary | ICD-10-CM | POA: Diagnosis not present

## 2023-12-26 MED ORDER — REPATHA SURECLICK 140 MG/ML ~~LOC~~ SOAJ: 1.0000 | SUBCUTANEOUS | 0 refills | Status: DC

## 2024-01-12 ENCOUNTER — Other Ambulatory Visit: Payer: Self-pay | Admitting: Cardiovascular Disease

## 2024-02-01 ENCOUNTER — Other Ambulatory Visit: Payer: Self-pay | Admitting: Pharmacist Clinician (PhC)/ Clinical Pharmacy Specialist

## 2024-02-01 MED ORDER — REPATHA SURECLICK 140 MG/ML ~~LOC~~ SOAJ: 1.0000 | SUBCUTANEOUS | 0 refills | Status: DC

## 2024-02-12 DIAGNOSIS — Z85828 Personal history of other malignant neoplasm of skin: Secondary | ICD-10-CM | POA: Diagnosis not present

## 2024-02-12 DIAGNOSIS — L988 Other specified disorders of the skin and subcutaneous tissue: Secondary | ICD-10-CM | POA: Diagnosis not present

## 2024-02-12 DIAGNOSIS — D0339 Melanoma in situ of other parts of face: Secondary | ICD-10-CM | POA: Diagnosis not present

## 2024-02-13 DIAGNOSIS — D0339 Melanoma in situ of other parts of face: Secondary | ICD-10-CM | POA: Diagnosis not present

## 2024-03-11 DIAGNOSIS — Z85820 Personal history of malignant melanoma of skin: Secondary | ICD-10-CM | POA: Diagnosis not present

## 2024-03-11 DIAGNOSIS — L905 Scar conditions and fibrosis of skin: Secondary | ICD-10-CM | POA: Diagnosis not present

## 2024-03-11 DIAGNOSIS — L570 Actinic keratosis: Secondary | ICD-10-CM | POA: Diagnosis not present

## 2024-03-11 DIAGNOSIS — Z85828 Personal history of other malignant neoplasm of skin: Secondary | ICD-10-CM | POA: Diagnosis not present

## 2024-03-25 ENCOUNTER — Encounter: Payer: Self-pay | Admitting: Cardiovascular Disease

## 2024-03-25 ENCOUNTER — Ambulatory Visit: Attending: Cardiovascular Disease | Admitting: Cardiovascular Disease

## 2024-03-25 VITALS — BP 122/79 | HR 78 | Ht 59.0 in | Wt 148.0 lb

## 2024-03-25 DIAGNOSIS — I10 Essential (primary) hypertension: Secondary | ICD-10-CM | POA: Insufficient documentation

## 2024-03-25 DIAGNOSIS — E785 Hyperlipidemia, unspecified: Secondary | ICD-10-CM | POA: Diagnosis not present

## 2024-03-25 DIAGNOSIS — Z9861 Coronary angioplasty status: Secondary | ICD-10-CM | POA: Insufficient documentation

## 2024-03-25 DIAGNOSIS — I2510 Atherosclerotic heart disease of native coronary artery without angina pectoris: Secondary | ICD-10-CM | POA: Insufficient documentation

## 2024-03-25 NOTE — Assessment & Plan Note (Signed)
 History of CAD status post non-STEMI 09/16/2020 troponins went up to 95.  Diagnostic coronary artery angiography was performed by Dr. Swaziland with stenting of the mid LAD the following day.  She had no other significant CAD.  She was on DAPT until recently and now only is on aspirin .  She denies chest pain or shortness of breath.

## 2024-03-25 NOTE — Assessment & Plan Note (Signed)
 History of essential hypertension with blood pressure measured today at 122/79.  She is on amlodipine  and metoprolol .

## 2024-03-25 NOTE — Assessment & Plan Note (Signed)
 History of dyslipidemia on Repatha  and pravastatin  with lipid profile performed 09/14/2023 revealing total cholesterol 116, LDL 35 and HDL 63.

## 2024-03-25 NOTE — Patient Instructions (Signed)

## 2024-03-25 NOTE — Progress Notes (Signed)
 03/25/2024 Jamie Benjamin   12-Nov-1945  [id number]  Primary Physician Douglass Niels, MD Primary Cardiologist: Dorn JINNY Lesches MD [facility] [place] [place], [place]  HPI:  Jamie Benjamin is a 78 y.o.   moderately overweight widowed Caucasian female mother of 2, grandmother of 5 grandchildren whose son Medford Gutter who is an ER physician at [employer].  I last saw her in the office 10//23.  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  She basically had no cardiac risk factors other now she she is treated for essential hypertension hyperlipidemia.  She had a non-STEMI 09/16/2020 with troponins went up to 95 and diagnostic coronary artery angiography, PCI and drug-eluting stenting of the mid LAD by Dr. Swaziland the following day.  She had no other CAD and preserved LV function.  She was on Brilinta  initially which was transitioned to Plavix  because of side effects.   She was seen in the ER for positional positional chest pain and shortness of breath.  She did have a small pericardial effusion and a left pleural effusion which improved over several days.  Symptoms were somewhat positional consistent with pleuropericarditis.  She did have exposure to a COVID-patient but tested negative.  It still not clear whether she did have COVID since she did have exposure to this.  Dr. Renato was consulted and recommended colchicine  which she has been on and since that time has had no recurrent chest pain.  I did tell her that she could discontinue colchicine  after being on it for 3 months.  Since I saw her in the office 2 years ago she has done well.  She apparently has been diagnosed with polycythemia.  She had to be taken off of clopidogrel  because of epistaxis.  She denies chest pain or shortness of breath.  She has lost 30 pounds on Ozempic and has brought her A1c down to below 6.   Current Meds  Medication Sig   amLODipine  (NORVASC ) 10 MG tablet Take 10 mg by mouth daily.    aspirin  EC (ASPIRIN  ADULT LOW STRENGTH) 81 MG tablet daily.   buPROPion  (WELLBUTRIN  XL) 300 MG 24 hr tablet Take 300 mg by mouth daily.   Cholecalciferol  (VITAMIN D ) 50 MCG (2000 UT) tablet Take 2,000 Units by mouth daily.   Evolocumab  (REPATHA  SURECLICK) 140 MG/ML SOAJ Inject 140 mg into the skin every 14 (fourteen) days.   FARXIGA 10 MG TABS tablet Take 10 mg by mouth daily.   levothyroxine  (SYNTHROID , LEVOTHROID) 50 MCG tablet Take 50 mcg by mouth daily before breakfast.   metoprolol  tartrate (LOPRESSOR ) 100 MG tablet Take 100 mg by mouth 2 (two) times daily.   nitroGLYCERIN  (NITROSTAT ) 0.4 MG SL tablet Place 1 tablet (0.4 mg total) under the tongue every 5 (five) minutes as needed for chest pain.   OZEMPIC, 2 MG/DOSE, 8 MG/3ML SOPN Inject 2 mg into the skin once a week.   pravastatin  (PRAVACHOL ) 20 MG tablet TAKE 1 TABLET BY MOUTH EVERY DAY IN THE EVENING     Allergies  Allergen Reactions   Influenza Virus Vaccine Other (See Comments)   Lisinopril Cough   Rosuvastatin      myalgia    Social History   Socioeconomic History   Marital status: Widowed    Spouse name: Not on file   Number of children: Not on file   Years of education: Not on file   Highest education level: Not on file  Occupational History   Occupation: Retired Charity fundraiser  Tobacco Use   Smoking status: Never   Smokeless tobacco: Never  Vaping Use   Vaping status: Never Used  Substance and Sexual Activity   Alcohol use: No   Drug use: No   Sexual activity: Not on file  Other Topics Concern   Not on file  Social History Narrative   Not on file   Social Drivers of Health   Financial Resource Strain: Not on file  Food Insecurity: Not on file  Transportation Needs: Not on file  Physical Activity: Not on file  Stress: Not on file  Social Connections: Not on file  Intimate Partner Violence: Not on file     Review of Systems: General: negative for chills, fever, night sweats or weight changes.   Cardiovascular: negative for chest pain, dyspnea on exertion, edema, orthopnea, palpitations, paroxysmal nocturnal dyspnea or shortness of breath Dermatological: negative for rash Respiratory: negative for cough or wheezing Urologic: negative for hematuria Abdominal: negative for nausea, vomiting, diarrhea, bright red blood per rectum, melena, or hematemesis Neurologic: negative for visual changes, syncope, or dizziness All other systems reviewed and are otherwise negative except as noted above.    Blood pressure 122/79, pulse 78, height 4' 11 (1.499 m), weight 148 lb (67.1 kg), SpO2 94%.  General appearance: alert and no distress Neck: no adenopathy, no carotid bruit, no JVD, supple, symmetrical, trachea midline, and thyroid  not enlarged, symmetric, no tenderness/mass/nodules Lungs: clear to auscultation bilaterally Heart: regular rate and rhythm, S1, S2 normal, no murmur, click, rub or gallop Extremities: extremities normal, atraumatic, no cyanosis or edema Pulses: 2+ and symmetric Skin: Skin color, texture, turgor normal. No rashes or lesions Neurologic: Grossly normal  EKG not performed today      ASSESSMENT AND PLAN:   Dyslipidemia, goal LDL below 70 History of dyslipidemia on Repatha  and pravastatin  with lipid profile performed 09/14/2023 revealing total cholesterol 116, LDL 35 and HDL 63.  Hypertension History of essential hypertension with blood pressure measured today at 122/79.  She is on amlodipine  and metoprolol .  CAD S/P percutaneous coronary angioplasty History of CAD status post non-STEMI 09/16/2020 troponins went up to 95.  Diagnostic coronary artery angiography was performed by Dr. Swaziland with stenting of the mid LAD the following day.  She had no other significant CAD.  She was on DAPT until recently and now only is on aspirin .  She denies chest pain or shortness of breath.     Dorn DOROTHA Lesches MD FACP,FACC,FAHA, [facility] 03/25/2024 1:58 PM

## 2024-03-30 ENCOUNTER — Other Ambulatory Visit: Payer: Self-pay | Admitting: Cardiovascular Disease

## 2024-03-30 DIAGNOSIS — I2510 Atherosclerotic heart disease of native coronary artery without angina pectoris: Secondary | ICD-10-CM

## 2024-03-30 DIAGNOSIS — E785 Hyperlipidemia, unspecified: Secondary | ICD-10-CM

## 2024-04-11 DIAGNOSIS — N1832 Chronic kidney disease, stage 3b: Secondary | ICD-10-CM | POA: Diagnosis not present

## 2024-04-16 DIAGNOSIS — N2581 Secondary hyperparathyroidism of renal origin: Secondary | ICD-10-CM | POA: Diagnosis not present

## 2024-04-16 DIAGNOSIS — E1122 Type 2 diabetes mellitus with diabetic chronic kidney disease: Secondary | ICD-10-CM | POA: Diagnosis not present

## 2024-04-16 DIAGNOSIS — N281 Cyst of kidney, acquired: Secondary | ICD-10-CM | POA: Diagnosis not present

## 2024-04-16 DIAGNOSIS — D751 Secondary polycythemia: Secondary | ICD-10-CM | POA: Diagnosis not present

## 2024-04-16 DIAGNOSIS — N1832 Chronic kidney disease, stage 3b: Secondary | ICD-10-CM | POA: Diagnosis not present

## 2024-04-16 DIAGNOSIS — I129 Hypertensive chronic kidney disease with stage 1 through stage 4 chronic kidney disease, or unspecified chronic kidney disease: Secondary | ICD-10-CM | POA: Diagnosis not present

## 2024-04-17 ENCOUNTER — Other Ambulatory Visit: Payer: Self-pay | Admitting: Nephrology

## 2024-04-17 DIAGNOSIS — N281 Cyst of kidney, acquired: Secondary | ICD-10-CM

## 2024-04-17 DIAGNOSIS — N1832 Chronic kidney disease, stage 3b: Secondary | ICD-10-CM

## 2024-04-22 DIAGNOSIS — Z85820 Personal history of malignant melanoma of skin: Secondary | ICD-10-CM | POA: Diagnosis not present

## 2024-04-22 DIAGNOSIS — Z85828 Personal history of other malignant neoplasm of skin: Secondary | ICD-10-CM | POA: Diagnosis not present

## 2024-04-22 DIAGNOSIS — L438 Other lichen planus: Secondary | ICD-10-CM | POA: Diagnosis not present

## 2024-04-26 ENCOUNTER — Ambulatory Visit
Admission: RE | Admit: 2024-04-26 | Discharge: 2024-04-26 | Disposition: A | Discharge status: Home / Self Care | Source: Ambulatory Visit | Attending: Nephrology | Admitting: Nephrology

## 2024-04-26 DIAGNOSIS — N281 Cyst of kidney, acquired: Secondary | ICD-10-CM | POA: Diagnosis not present

## 2024-04-26 DIAGNOSIS — N1832 Chronic kidney disease, stage 3b: Secondary | ICD-10-CM | POA: Diagnosis not present

## 2024-06-17 ENCOUNTER — Telehealth: Payer: Self-pay

## 2024-06-17 NOTE — Telephone Encounter (Signed)
 Spoke with patient and confirmed appointment on 11/18

## 2024-06-18 ENCOUNTER — Inpatient Hospital Stay: Attending: Hematology and Oncology

## 2024-06-18 ENCOUNTER — Inpatient Hospital Stay (HOSPITAL_BASED_OUTPATIENT_CLINIC_OR_DEPARTMENT_OTHER): Admitting: Hematology and Oncology

## 2024-06-18 VITALS — BP 120/71 | HR 85 | Temp 98.0°F | Resp 16 | Wt 146.5 lb

## 2024-06-18 DIAGNOSIS — Z7982 Long term (current) use of aspirin: Secondary | ICD-10-CM | POA: Diagnosis not present

## 2024-06-18 DIAGNOSIS — M129 Arthropathy, unspecified: Secondary | ICD-10-CM | POA: Diagnosis not present

## 2024-06-18 DIAGNOSIS — E785 Hyperlipidemia, unspecified: Secondary | ICD-10-CM | POA: Diagnosis not present

## 2024-06-18 DIAGNOSIS — Z85828 Personal history of other malignant neoplasm of skin: Secondary | ICD-10-CM | POA: Diagnosis not present

## 2024-06-18 DIAGNOSIS — D751 Secondary polycythemia: Secondary | ICD-10-CM

## 2024-06-18 DIAGNOSIS — E039 Hypothyroidism, unspecified: Secondary | ICD-10-CM | POA: Diagnosis not present

## 2024-06-18 DIAGNOSIS — I10 Essential (primary) hypertension: Secondary | ICD-10-CM | POA: Diagnosis not present

## 2024-06-18 DIAGNOSIS — I2510 Atherosclerotic heart disease of native coronary artery without angina pectoris: Secondary | ICD-10-CM | POA: Diagnosis not present

## 2024-06-18 DIAGNOSIS — N1831 Chronic kidney disease, stage 3a: Secondary | ICD-10-CM | POA: Diagnosis not present

## 2024-06-18 DIAGNOSIS — Z7984 Long term (current) use of oral hypoglycemic drugs: Secondary | ICD-10-CM | POA: Diagnosis not present

## 2024-06-18 DIAGNOSIS — I252 Old myocardial infarction: Secondary | ICD-10-CM | POA: Diagnosis not present

## 2024-06-18 DIAGNOSIS — K589 Irritable bowel syndrome without diarrhea: Secondary | ICD-10-CM | POA: Diagnosis not present

## 2024-06-18 DIAGNOSIS — Z803 Family history of malignant neoplasm of breast: Secondary | ICD-10-CM | POA: Diagnosis not present

## 2024-06-18 DIAGNOSIS — R634 Abnormal weight loss: Secondary | ICD-10-CM | POA: Diagnosis not present

## 2024-06-18 DIAGNOSIS — Z79899 Other long term (current) drug therapy: Secondary | ICD-10-CM | POA: Diagnosis not present

## 2024-06-18 LAB — CBC WITH DIFFERENTIAL/PLATELET
Abs Immature Granulocytes: 0.02 10*3/uL (ref 0.00–0.07)
Basophils Absolute: 0.1 10*3/uL (ref 0.0–0.1)
Basophils Relative: 1 %
Eosinophils Absolute: 0.2 10*3/uL (ref 0.0–0.5)
Eosinophils Relative: 3 %
HCT: 47.5 % — ABNORMAL HIGH (ref 36.0–46.0)
Hemoglobin: 16.7 g/dL — ABNORMAL HIGH (ref 12.0–15.0)
Immature Granulocytes: 0 %
Lymphocytes Relative: 14 %
Lymphs Abs: 1.2 10*3/uL (ref 0.7–4.0)
MCH: 31 pg (ref 26.0–34.0)
MCHC: 35.2 g/dL (ref 30.0–36.0)
MCV: 88.1 fL (ref 80.0–100.0)
Monocytes Absolute: 0.8 10*3/uL (ref 0.1–1.0)
Monocytes Relative: 9 %
Neutro Abs: 6.4 10*3/uL (ref 1.7–7.7)
Neutrophils Relative %: 73 %
Platelets: 306 10*3/uL (ref 150–400)
RBC: 5.39 MIL/uL — ABNORMAL HIGH (ref 3.87–5.11)
RDW: 12.5 % (ref 11.5–15.5)
WBC: 8.8 10*3/uL (ref 4.0–10.5)
nRBC: 0 % (ref 0.0–0.2)

## 2024-06-18 NOTE — Progress Notes (Signed)
 Jamie Benjamin CONSULT NOTE  Patient Care Team: Jamie Ivanoff, MD as PCP - General (Family Medicine)  CHIEF COMPLAINTS/PURPOSE OF CONSULTATION:  Polycythemia  ASSESSMENT & PLAN:  Polycythemia, secondary This is a very pleasant 78 yr old female pt with polycythemia referred to hematology.  Assessment and Plan Assessment & Plan Secondary polycythemia Hemoglobin stable at 16.7 g/dL. Normal erythropoietin  levels. Rest of the MPN testing neg, low likelihood of polycythemia vera. - could be secondary to OSA?  - She remains asymptomatic and no significant worsening in the past few months hence we have recommended active surveillance - Labs from today stable, no major concerns. - No concerns on exam - We will continue to review H and P and labs every 6 months - She was instructed to call with any worsening concerns.      HISTORY OF PRESENTING ILLNESS:  Jamie Benjamin 78 y.o. female is here because of Polycythemia  History of Present Illness    Discussed the use of AI scribe software for clinical note transcription with the patient, who gave verbal consent to proceed.  History of Present Illness Jamie Benjamin is a 78 year old female with melanoma and coronary artery disease who presents for follow-up.  She has a history of melanoma on her face, initially identified as a sunspot that changed color. It was excised, followed by facial reconstruction, resulting in significant scarring. The melanoma was stage zero, and no lymph nodes were removed.  She is currently on Ozempic, which has facilitated a weight loss of 32 pounds and stabilization of her weight. Her current dose is two (units not specified). Her A1c has improved from 7% to 5%, and her kidney function has shown significant improvement.  In 2023, she experienced a myocardial infarction due to plaque in one artery, while others were clear. She inquires about the potential relationship between her elevated hemoglobin  level, currently stable at 16.7, and her heart condition. JAK2 and genetic testing were normal.  No headaches, blurred vision, chest pain, or bleeding. She has not been diagnosed with sleep apnea and has not undergone a sleep study.   Rest of the pertinent 10 point ROS reviewed and negative.  MEDICAL HISTORY:  Past Medical History:  Diagnosis Date   Allergy    seasonal   Arthritis    Breast cancer (HCC) 2005   left   Depression    Diverticulitis    Hyperlipidemia    Hypertension    Hypothyroidism (acquired)    IBS (irritable bowel syndrome)    Obesity    Prediabetes    Stage 3a chronic kidney disease (HCC) 09/16/2020    SURGICAL HISTORY: Past Surgical History:  Procedure Laterality Date   COLONOSCOPY     CORONARY IMAGING/OCT N/A 09/17/2020   Procedure: INTRAVASCULAR IMAGING/OCT;  Surgeon: Jordan, Peter M, MD;  Location: [facility] INVASIVE CV LAB;  Service: Cardiovascular;  Laterality: N/A;   CORONARY STENT INTERVENTION N/A 09/17/2020   Procedure: CORONARY STENT INTERVENTION;  Surgeon: Jordan, Peter M, MD;  Location: [facility] INVASIVE CV LAB;  Service: Cardiovascular;  Laterality: N/A;   LEFT HEART CATH AND CORONARY ANGIOGRAPHY N/A 09/17/2020   Procedure: LEFT HEART CATH AND CORONARY ANGIOGRAPHY;  Surgeon: Jordan, Peter M, MD;  Location: [facility] INVASIVE CV LAB;  Service: Cardiovascular;  Laterality: N/A;   MASTECTOMY, RADICAL Bilateral 10 years ago   ROTATOR CUFF REPAIR      SOCIAL HISTORY: Social History   Socioeconomic History   Marital status: Widowed    Spouse  name: Not on file   Number of children: Not on file   Years of education: Not on file   Highest education level: Not on file  Occupational History   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  Tobacco Use   Smoking status: Never   Smokeless tobacco: Never  Vaping Use   Vaping status: Never Used  Substance and Sexual Activity   Alcohol use: No   Drug use: No   Sexual activity: Not on file  Other Topics Concern   Not on file  Social  History Narrative   Not on file   Social Drivers of Health   Financial Resource Strain: Not on file  Food Insecurity: Not on file  Transportation Needs: Not on file  Physical Activity: Not on file  Stress: Not on file  Social Connections: Not on file  Intimate Partner Violence: Not on file    FAMILY HISTORY: Family History  Problem Relation Age of Onset   Breast cancer Sister    Ulcerative colitis Sister    Colon cancer Neg Hx    Colon polyps Neg Hx    Esophageal cancer Neg Hx    Rectal cancer Neg Hx    Stomach cancer Neg Hx     ALLERGIES:  is allergic to influenza virus vaccine, lisinopril, and rosuvastatin .  MEDICATIONS:  Current Outpatient Medications  Medication Sig Dispense Refill   amLODipine  (NORVASC ) 10 MG tablet Take 10 mg by mouth daily.     aspirin  EC (ASPIRIN  ADULT LOW STRENGTH) 81 MG tablet daily.     buPROPion  (WELLBUTRIN  XL) 300 MG 24 hr tablet Take 300 mg by mouth daily.     Cholecalciferol  (VITAMIN D ) 50 MCG (2000 UT) tablet Take 2,000 Units by mouth daily.     Evolocumab  (REPATHA  SURECLICK) 140 MG/ML SOAJ INJECT 140 MG INTO THE SKIN EVERY 14 (FOURTEEN) DAYS. 6 mL 0   FARXIGA 10 MG TABS tablet Take 10 mg by mouth daily.     levothyroxine  (SYNTHROID , LEVOTHROID) 50 MCG tablet Take 50 mcg by mouth daily before breakfast.     metoprolol  tartrate (LOPRESSOR ) 100 MG tablet Take 100 mg by mouth 2 (two) times daily.     nitroGLYCERIN  (NITROSTAT ) 0.4 MG SL tablet Place 1 tablet (0.4 mg total) under the tongue every 5 (five) minutes as needed for chest pain. 100 tablet 0   OZEMPIC, 2 MG/DOSE, 8 MG/3ML SOPN Inject 2 mg into the skin once a week.     pravastatin  (PRAVACHOL ) 20 MG tablet TAKE 1 TABLET BY MOUTH EVERY DAY IN THE EVENING 90 tablet 2   No current facility-administered medications for this visit.     PHYSICAL EXAMINATION: ECOG PERFORMANCE STATUS: 0 - Asymptomatic  Vitals:   06/18/24 1101  BP: 120/71  Pulse: 85  Resp: 16  Temp: 98 F (36.7 C)   SpO2: 96%    Filed Weights   06/18/24 1101  Weight: 146 lb 8 oz (66.5 kg)   Physical Exam Constitutional:      Appearance: Normal appearance.  Cardiovascular:     Rate and Rhythm: Normal rate and regular rhythm.     Pulses: Normal pulses.     Heart sounds: Normal heart sounds.  Pulmonary:     Effort: Pulmonary effort is normal.     Breath sounds: Normal breath sounds.  Abdominal:     General: Abdomen is flat.     Palpations: Abdomen is soft.  Musculoskeletal:        General: No swelling. Normal range of motion.  Cervical back: Normal range of motion and neck supple. No rigidity.  Lymphadenopathy:     Cervical: No cervical adenopathy.  Skin:    General: Skin is warm and dry.  Neurological:     General: No focal deficit present.     Mental Status: She is alert.      LABORATORY DATA:  I have reviewed the data as listed Lab Results  Component Value Date   WBC 8.8 06/18/2024   HGB 16.7 (H) 06/18/2024   HCT 47.5 (H) 06/18/2024   MCV 88.1 06/18/2024   PLT 306 06/18/2024     Chemistry      Component Value Date/Time   NA 139 08/26/2023 1023   NA 141 11/25/2020 1111   K 4.1 08/26/2023 1023   CL 104 08/26/2023 1023   CO2 29 08/26/2023 1023   BUN 23 08/26/2023 1023   BUN 35 (H) 11/25/2020 1111   CREATININE 1.29 (H) 08/26/2023 1023      Component Value Date/Time   CALCIUM  10.1 08/26/2023 1023   ALKPHOS 107 08/26/2023 1023   AST 21 08/26/2023 1023   ALT 27 08/26/2023 1023   BILITOT 0.5 08/26/2023 1023     Labs reviewed  RADIOGRAPHIC STUDIES: I have personally reviewed the radiological images as listed and agreed with the findings in the report. No results found.  All questions were answered. The patient knows to call the clinic with any problems, questions or concerns. Time spent: 30 min including H and P, review of records, counseling and coordination of care.     Amber Stalls, MD 06/18/2024 4:20 PM

## 2024-06-18 NOTE — Assessment & Plan Note (Signed)
 This is a very pleasant 78 yr old female pt with polycythemia referred to hematology.  Assessment and Plan Assessment & Plan Secondary polycythemia Hemoglobin stable at 16.7 g/dL. Normal erythropoietin  levels. Rest of the MPN testing neg, low likelihood of polycythemia vera. - could be secondary to OSA?  - She remains asymptomatic and no significant worsening in the past few months hence we have recommended active surveillance - Labs from today stable, no major concerns. - No concerns on exam - We will continue to review H and P and labs every 6 months - She was instructed to call with any worsening concerns.

## 2024-06-19 ENCOUNTER — Telehealth: Payer: Self-pay

## 2024-06-19 DIAGNOSIS — L570 Actinic keratosis: Secondary | ICD-10-CM | POA: Diagnosis not present

## 2024-06-19 DIAGNOSIS — Z85828 Personal history of other malignant neoplasm of skin: Secondary | ICD-10-CM | POA: Diagnosis not present

## 2024-06-19 NOTE — Telephone Encounter (Signed)
 Faxed medical records request to [facility] at [phone].

## 2024-06-19 NOTE — Telephone Encounter (Signed)
-----   Message from Baldwin Iruku sent at 06/18/2024 11:12 AM EST ----- Can we request Melanoma records for this pt from [facility] dermatology, surgeon is Dr Jadine and dermatologist is Dr Arlen.  Thanks,

## 2024-07-01 ENCOUNTER — Other Ambulatory Visit: Payer: Self-pay | Admitting: Pharmacist Clinician (PhC)/ Clinical Pharmacy Specialist

## 2024-07-01 ENCOUNTER — Telehealth: Payer: Self-pay | Admitting: Pharmacist Clinician (PhC)/ Clinical Pharmacy Specialist

## 2024-07-01 DIAGNOSIS — E785 Hyperlipidemia, unspecified: Secondary | ICD-10-CM

## 2024-07-01 DIAGNOSIS — I2510 Atherosclerotic heart disease of native coronary artery without angina pectoris: Secondary | ICD-10-CM

## 2024-07-01 MED ORDER — REPATHA SURECLICK 140 MG/ML ~~LOC~~ SOAJ: 1.0000 | SUBCUTANEOUS | 0 refills | Status: DC

## 2024-07-01 NOTE — Telephone Encounter (Signed)
 Pt c/o medication issue:  1. Name of Medication:   Evolocumab  (REPATHA  SURECLICK) 140 MG/ML SOAJ    2. How are you currently taking this medication (dosage and times per day)? As written  3. Are you having a reaction (difficulty breathing--STAT)? No  4. What is your medication issue? Pt received this medication and couldn't use the injection so she sent it back to request a new pin and was told to contact her pharmacist.

## 2024-07-03 NOTE — Telephone Encounter (Signed)
 Confirm with patient, she received call from [employer] product replacement program that they had received an order to replace her faulty device. Issue has been resolved

## 2024-07-16 DIAGNOSIS — D225 Melanocytic nevi of trunk: Secondary | ICD-10-CM | POA: Diagnosis not present

## 2024-07-16 DIAGNOSIS — L821 Other seborrheic keratosis: Secondary | ICD-10-CM | POA: Diagnosis not present

## 2024-07-16 DIAGNOSIS — L814 Other melanin hyperpigmentation: Secondary | ICD-10-CM | POA: Diagnosis not present

## 2024-07-16 DIAGNOSIS — B078 Other viral warts: Secondary | ICD-10-CM | POA: Diagnosis not present

## 2024-07-16 DIAGNOSIS — L438 Other lichen planus: Secondary | ICD-10-CM | POA: Diagnosis not present

## 2024-07-16 DIAGNOSIS — L738 Other specified follicular disorders: Secondary | ICD-10-CM | POA: Diagnosis not present

## 2024-07-16 DIAGNOSIS — D1801 Hemangioma of skin and subcutaneous tissue: Secondary | ICD-10-CM | POA: Diagnosis not present

## 2024-07-16 DIAGNOSIS — Z85820 Personal history of malignant melanoma of skin: Secondary | ICD-10-CM | POA: Diagnosis not present

## 2024-07-16 DIAGNOSIS — L570 Actinic keratosis: Secondary | ICD-10-CM | POA: Diagnosis not present

## 2024-07-16 DIAGNOSIS — D2261 Melanocytic nevi of right upper limb, including shoulder: Secondary | ICD-10-CM | POA: Diagnosis not present

## 2024-07-16 DIAGNOSIS — Z85828 Personal history of other malignant neoplasm of skin: Secondary | ICD-10-CM | POA: Diagnosis not present

## 2024-08-01 ENCOUNTER — Other Ambulatory Visit: Payer: Self-pay | Admitting: Cardiovascular Disease

## 2024-08-01 DIAGNOSIS — E785 Hyperlipidemia, unspecified: Secondary | ICD-10-CM

## 2024-08-01 DIAGNOSIS — I2510 Atherosclerotic heart disease of native coronary artery without angina pectoris: Secondary | ICD-10-CM

## 2024-12-17 ENCOUNTER — Inpatient Hospital Stay: Admitting: Hematology and Oncology

## 2024-12-17 ENCOUNTER — Inpatient Hospital Stay
